# Patient Record
Sex: Female | Born: 1935 | Race: White | Hispanic: No | Smoking: Never smoker
Health system: Southern US, Community
[De-identification: ages and names within clinical notes are randomized; demographics above are authoritative.]

## PROBLEM LIST (undated history)

## (undated) DIAGNOSIS — Z860100 Personal history of colon polyps, unspecified: Secondary | ICD-10-CM

## (undated) DIAGNOSIS — E785 Hyperlipidemia, unspecified: Secondary | ICD-10-CM

## (undated) DIAGNOSIS — K648 Other hemorrhoids: Secondary | ICD-10-CM

## (undated) DIAGNOSIS — B029 Zoster without complications: Secondary | ICD-10-CM

## (undated) HISTORY — DX: Other hemorrhoids: K64.8

## (undated) HISTORY — PX: COLONOSCOPY: SHX174

## (undated) HISTORY — PX: CHOLECYSTECTOMY: SHX55

## (undated) HISTORY — DX: Personal history of colon polyps, unspecified: Z86.0100

## (undated) HISTORY — PX: ESOPHAGOGASTRODUODENOSCOPY: SHX1529

## (undated) HISTORY — DX: Personal history of colonic polyps: Z86.010

## (undated) HISTORY — PX: RIGHT COLECTOMY: SHX853

## (undated) HISTORY — DX: Hyperlipidemia, unspecified: E78.5

---

## 1999-11-23 ENCOUNTER — Other Ambulatory Visit: Admission: RE | Admit: 1999-11-23 | Discharge: 1999-11-23 | Payer: Self-pay | Admitting: Internal Medicine

## 2005-10-11 ENCOUNTER — Ambulatory Visit: Payer: Self-pay | Admitting: Internal Medicine

## 2005-10-16 ENCOUNTER — Ambulatory Visit: Payer: Self-pay | Admitting: Internal Medicine

## 2005-10-22 ENCOUNTER — Encounter: Admission: RE | Admit: 2005-10-22 | Discharge: 2005-10-22 | Payer: Self-pay | Admitting: Internal Medicine

## 2005-10-24 ENCOUNTER — Encounter (INDEPENDENT_AMBULATORY_CARE_PROVIDER_SITE_OTHER): Payer: Self-pay | Admitting: *Deleted

## 2005-10-24 ENCOUNTER — Ambulatory Visit: Payer: Self-pay | Admitting: Internal Medicine

## 2005-11-01 ENCOUNTER — Ambulatory Visit: Payer: Self-pay | Admitting: Internal Medicine

## 2005-11-05 ENCOUNTER — Ambulatory Visit: Payer: Self-pay | Admitting: Internal Medicine

## 2005-11-09 ENCOUNTER — Ambulatory Visit: Payer: Self-pay | Admitting: Internal Medicine

## 2005-12-03 ENCOUNTER — Ambulatory Visit: Payer: Self-pay | Admitting: Internal Medicine

## 2005-12-10 ENCOUNTER — Ambulatory Visit: Payer: Self-pay | Admitting: Internal Medicine

## 2006-01-03 ENCOUNTER — Ambulatory Visit: Payer: Self-pay | Admitting: Internal Medicine

## 2006-01-31 ENCOUNTER — Inpatient Hospital Stay (HOSPITAL_COMMUNITY): Admission: RE | Admit: 2006-01-31 | Discharge: 2006-02-05 | Payer: Self-pay | Admitting: General Surgery

## 2006-01-31 ENCOUNTER — Encounter (INDEPENDENT_AMBULATORY_CARE_PROVIDER_SITE_OTHER): Payer: Self-pay | Admitting: Specialist

## 2006-03-04 ENCOUNTER — Ambulatory Visit: Payer: Self-pay | Admitting: Internal Medicine

## 2006-03-14 ENCOUNTER — Ambulatory Visit: Payer: Self-pay | Admitting: Family Medicine

## 2006-03-25 ENCOUNTER — Ambulatory Visit: Payer: Self-pay | Admitting: Internal Medicine

## 2006-06-19 ENCOUNTER — Ambulatory Visit: Payer: Self-pay | Admitting: Internal Medicine

## 2006-09-06 ENCOUNTER — Ambulatory Visit: Payer: Self-pay | Admitting: Internal Medicine

## 2006-12-10 ENCOUNTER — Encounter: Admission: RE | Admit: 2006-12-10 | Discharge: 2006-12-10 | Payer: Self-pay | Admitting: Internal Medicine

## 2007-05-05 DIAGNOSIS — E785 Hyperlipidemia, unspecified: Secondary | ICD-10-CM | POA: Insufficient documentation

## 2007-05-05 DIAGNOSIS — Z86010 Personal history of colonic polyps: Secondary | ICD-10-CM | POA: Insufficient documentation

## 2007-05-05 DIAGNOSIS — M810 Age-related osteoporosis without current pathological fracture: Secondary | ICD-10-CM | POA: Insufficient documentation

## 2007-08-12 ENCOUNTER — Ambulatory Visit: Payer: Self-pay | Admitting: Internal Medicine

## 2007-08-12 DIAGNOSIS — R109 Unspecified abdominal pain: Secondary | ICD-10-CM | POA: Insufficient documentation

## 2008-10-20 ENCOUNTER — Ambulatory Visit: Payer: Self-pay | Admitting: Internal Medicine

## 2008-10-29 ENCOUNTER — Ambulatory Visit: Payer: Self-pay | Admitting: Internal Medicine

## 2008-10-29 LAB — HM COLONOSCOPY

## 2010-04-11 ENCOUNTER — Telehealth: Payer: Self-pay | Admitting: Internal Medicine

## 2010-04-19 ENCOUNTER — Emergency Department (HOSPITAL_COMMUNITY): Admission: EM | Admit: 2010-04-19 | Discharge: 2010-04-19 | Payer: Self-pay | Admitting: Emergency Medicine

## 2010-06-05 ENCOUNTER — Ambulatory Visit: Payer: Self-pay | Admitting: Internal Medicine

## 2010-06-05 ENCOUNTER — Encounter: Payer: Self-pay | Admitting: Internal Medicine

## 2010-06-05 LAB — CONVERTED CEMR LAB
AST: 24 units/L (ref 0–37)
Alkaline Phosphatase: 150 units/L — ABNORMAL HIGH (ref 39–117)
BUN: 11 mg/dL (ref 6–23)
Basophils Relative: 0.7 % (ref 0.0–3.0)
Bilirubin, Direct: 0.1 mg/dL (ref 0.0–0.3)
CO2: 29 meq/L (ref 19–32)
Calcium: 9.5 mg/dL (ref 8.4–10.5)
Creatinine, Ser: 0.6 mg/dL (ref 0.4–1.2)
GFR calc non Af Amer: 105.96 mL/min (ref >60)
Glucose, Bld: 86 mg/dL (ref 70–99)
HCT: 39.3 % (ref 36.0–46.0)
Hemoglobin: 13.2 g/dL (ref 12.0–15.0)
LDL Cholesterol: 96 mg/dL (ref 0–99)
MCHC: 33.6 g/dL (ref 30.0–36.0)
MCV: 94.7 fL (ref 78.0–100.0)
Monocytes Relative: 6.3 % (ref 3.0–12.0)
Neutro Abs: 4.4 10*3/uL (ref 1.4–7.7)
Platelets: 245 10*3/uL (ref 150.0–400.0)
RBC: 4.15 M/uL (ref 3.87–5.11)
RDW: 13.9 % (ref 11.5–14.6)
TSH: 0.67 microintl units/mL (ref 0.35–5.50)
Total Bilirubin: 0.7 mg/dL (ref 0.3–1.2)
Total CHOL/HDL Ratio: 3

## 2010-06-09 ENCOUNTER — Telehealth: Payer: Self-pay | Admitting: Internal Medicine

## 2010-06-12 LAB — CONVERTED CEMR LAB: Vit D, 25-Hydroxy: 24 ng/mL — ABNORMAL LOW (ref 30–89)

## 2010-08-09 ENCOUNTER — Encounter: Payer: Self-pay | Admitting: Internal Medicine

## 2010-09-21 NOTE — Assessment & Plan Note (Signed)
Summary: CPX (PT WILL COME IN FASTING) // RS   Vital Signs:  Patient profile:   76 year old female Height:      63 inches Weight:      118 pounds BMI:     20.98 Temp:     98.1 degrees F oral Pulse rate:   64 / minute Resp:     14 per minute BP sitting:   124 / 76  (left arm)  Vitals Entered By: Willy Eddy, LPN (June 05, 2010 11:24 AM) CC: annual visit for disease management- fastin this am Is Patient Diabetic? No   Primary Care Provider:  Stacie Glaze MD  CC:  annual visit for disease management- fastin this am.  History of Present Illness: fasting for blood work fx arm left are in August now has RSD ( relex sympathetic dystropy and is in PT and has plans for  a nerve block has osteoporosis but does not like medications refusing immunization  we were able to convince her t have the flu shot she has had multiple bone densities and doen not think she nees another she has osteoporsis  Preventive Screening-Counseling & Management  Alcohol-Tobacco     Smoking Status: never     Tobacco Counseling: not indicated; no tobacco use  Problems Prior to Update: 1)  Physical Examination  (ICD-V70.0) 2)  Abdominal Pain Other Specified Site  (ICD-789.09) 3)  Colonic Polyps, Hx of  (ICD-V12.72) 4)  Family History Diabetes 1st Degree Relative  (ICD-V18.0) 5)  Family History of Colon Ca 1st Degree Relative <60  (ICD-V16.0) 6)  Osteoporosis  (ICD-733.00) 7)  Hyperlipidemia  (ICD-272.4)  Current Problems (verified): 1)  Abdominal Pain Other Specified Site  (ICD-789.09) 2)  Colonic Polyps, Hx of  (ICD-V12.72) 3)  Family History Diabetes 1st Degree Relative  (ICD-V18.0) 4)  Family History of Colon Ca 1st Degree Relative <60  (ICD-V16.0) 5)  Osteoporosis  (ICD-733.00) 6)  Hyperlipidemia  (ICD-272.4)  Medications Prior to Update: 1)  Ca + & Vit D 2)  Doxycycline Hyclate 100 Mg Caps (Doxycycline Hyclate) .... One By Mouth Two Times A Day X 7 Days  Current Medications  (verified): 1)  Oscal 500/200 D-3 500-200 Mg-Unit Tabs (Calcium-Vitamin D) .... One By Mouth Two Times A Day 2)  Vitamin D3 2000 Unit Caps (Cholecalciferol) .... One By Mouth Daily 3)  Aspir-Low 81 Mg Tbec (Aspirin) .... One By Mouth Daily  Allergies (verified): 1)  ! Asa  Contraindications/Deferment of Procedures/Staging:    Test/Procedure: Mammogram    Reason for deferment: patient declined     Test/Procedure: FLU VAX    Reason for deferment: patient declined     Test/Procedure: Pneumovax vaccine    Reason for deferment: patient declined     Test/Procedure: DPT vaccine    Reason for deferment: declined   Past History:  Family History: Last updated: 05/05/2007 Family History of Colon CA 1st degree relative <60 Family History Diabetes 1st degree relative Family History Liver disease Family History Lung cancer Family History of Cardiovascular disorder  Social History: Last updated: 05/05/2007 Married Never Smoked Alcohol use-yes Drug use-no  Risk Factors: Smoking Status: never (06/05/2010)  Past medical, surgical, family and social histories (including risk factors) reviewed for relevance to current acute and chronic problems.  Past Medical History: Reviewed history from 05/05/2007 and no changes required. Hyperlipidemia Osteoporosis Colonic polyps, hx of  Past Surgical History: Reviewed history from 05/05/2007 and no changes required. Colonoscopy  Family History: Reviewed history from 05/05/2007  and no changes required. Family History of Colon CA 1st degree relative <60 Family History Diabetes 1st degree relative Family History Liver disease Family History Lung cancer Family History of Cardiovascular disorder  Social History: Reviewed history from 05/05/2007 and no changes required. Married Never Smoked Alcohol use-yes Drug use-no  Review of Systems  The patient denies anorexia, fever, weight loss, weight gain, vision loss, decreased hearing,  hoarseness, chest pain, syncope, dyspnea on exertion, peripheral edema, prolonged cough, headaches, hemoptysis, abdominal pain, melena, hematochezia, severe indigestion/heartburn, hematuria, incontinence, genital sores, muscle weakness, suspicious skin lesions, transient blindness, difficulty walking, depression, unusual weight change, abnormal bleeding, enlarged lymph nodes, angioedema, and breast masses.         Flu Vaccine Consent Questions     Do you have a history of severe allergic reactions to this vaccine? no    Any prior history of allergic reactions to egg and/or gelatin? no    Do you have a sensitivity to the preservative Thimersol? no    Do you have a past history of Guillan-Barre Syndrome? no    Do you currently have an acute febrile illness? no    Have you ever had a severe reaction to latex? no    Vaccine information given and explained to patient? yes    Are you currently pregnant? no    Lot Number:[id number]   Exp Date:02/17/2011   Site Given  Left Deltoid IM   Physical Exam  General:  Well-developed,well-nourished,in no acute distress; alert,appropriate and cooperative throughout examination Head:  Normocephalic and atraumatic without obvious abnormalities. No apparent alopecia or balding. Eyes:  pupils equal and pupils reactive to light.   Ears:  R ear normal and L ear normal.   Nose:  no external deformity and no nasal discharge.   Lungs:  normal respiratory effort and no wheezes.   Heart:  normal rate and regular rhythm.   Abdomen:  soft and non-tender.   Msk:  No deformity or scoliosis noted of thoracic or lumbar spine.   Pulses:  R and L carotid,radial,femoral,dorsalis pedis and posterior tibial pulses are full and equal bilaterally Extremities:  No clubbing, cyanosis, edema, or deformity noted with normal full range of motion of all joints.   Neurologic:  No cranial nerve deficits noted. Station and gait are normal. Plantar reflexes are down-going bilaterally. DTRs  are symmetrical throughout. Sensory, motor and coordinative functions appear intact.   Impression & Recommendations:  Problem # 1:  PHYSICAL EXAMINATION (ICD-V70.0) The pt was asked about all immunizations, health maint. services that are appropriate to their age and was given guidance on diet exercize  and weight management  Orders: EKG w/ Interpretation (93000) TLB-Lipid Panel (80061-LIPID) TLB-BMP (Basic Metabolic Panel-BMET) (80048-METABOL) TLB-CBC Platelet - w/Differential (85025-CBCD) TLB-Hepatic/Liver Function Pnl (80076-HEPATIC) TLB-TSH (Thyroid Stimulating Hormone) (84443-TSH) Urinalysis-dipstick only (Medicare patient) (16109UE) Venipuncture (45409) Specimen Handling (81191)  Colonoscopy: Location:  [facility].   (10/29/2008) Next Colonoscopy due:: 10/2013 (10/29/2008)  Discussed using sunscreen, use of alcohol, drug use, self breast exam, routine dental care, routine eye care, schedule for GYN exam, routine physical exam, seat belts, multiple vitamins, osteoporosis prevention, adequate calcium intake in diet, recommendations for immunizations, mammograms and Pap smears.  Discussed exercise and checking cholesterol.  Discussed gun safety, safe sex, and contraception.  Problem # 2:  OSTEOPOROSIS (ICD-733.00)  add vitamin D discussed the blone density-- refused Discussed medication use, applications of heat or ice, and exercises.   Orders: T-Vitamin D (25-Hydroxy) [phone])  Complete Medication List: 1)  Oscal 500/200 D-3 500-200 Mg-unit Tabs (Calcium-vitamin d) .... One by mouth two times a day 2)  Vitamin D3 2000 Unit Caps (Cholecalciferol) .... One by mouth daily 3)  Aspir-low 81 Mg Tbec (Aspirin) .... One by mouth daily  Other Orders: Flu Vaccine 40yrs + MEDICARE PATIENTS (E4540) Administration Flu vaccine - MCR (J8119)  Patient Instructions: 1)  Please schedule a follow-up appointment in 1 year. for CPX   Orders Added: 1)  EKG w/  Interpretation [93000] 2)  Est. Patient 65& > [99397] 3)  Est. Patient Level II [99212] 4)  TLB-Lipid Panel [80061-LIPID] 5)  TLB-BMP (Basic Metabolic Panel-BMET) [80048-METABOL] 6)  TLB-CBC Platelet - w/Differential [85025-CBCD] 7)  TLB-Hepatic/Liver Function Pnl [80076-HEPATIC] 8)  TLB-TSH (Thyroid Stimulating Hormone) [84443-TSH] 9)  Urinalysis-dipstick only (Medicare patient) [81003QW] 10)  T-Vitamin D (25-Hydroxy) [14782-95621] 11)  Flu Vaccine 16yrs + MEDICARE PATIENTS [Q2039] 12)  Administration Flu vaccine - MCR [G0008] 13)  Venipuncture [36415] 14)  Specimen Handling [99000]  Appended Document: CPX (PT WILL COME IN FASTING) // RS  Laboratory Results   Urine Tests    Routine Urinalysis   Color: yellow Appearance: Clear Glucose: negative   (Normal Range: Negative) Bilirubin: negative   (Normal Range: Negative) Ketone: negative   (Normal Range: Negative) Spec. Gravity: <1.005   (Normal Range: 1.003-1.035) Blood: trace-lysed   (Normal Range: Negative) pH: 5.0   (Normal Range: 5.0-8.0) Protein: negative   (Normal Range: Negative) Urobilinogen: 0.2   (Normal Range: 0-1) Nitrite: negative   (Normal Range: Negative) Leukocyte Esterace: negative   (Normal Range: Negative)    Comments: Sheila Wood  June 05, 2010 3:13 PM

## 2010-09-21 NOTE — Progress Notes (Signed)
Summary: bee sting  Phone Note Call from Patient   Caller: Patient Call For: Stacie Glaze MD Summary of Call: Bee sting from this weekend that looks infected.  [phone] Initial call taken by: Lynann Beaver CMA,  April 11, 2010 8:54 AM  Follow-up for Phone Call        doxy 100 two times a day for 7 days Follow-up by: Stacie Glaze MD,  April 11, 2010 9:42 AM    New/Updated Medications: DOXYCYCLINE HYCLATE 100 MG CAPS (DOXYCYCLINE HYCLATE) one by mouth two times a day x 7 days Prescriptions: DOXYCYCLINE HYCLATE 100 MG CAPS (DOXYCYCLINE HYCLATE) one by mouth two times a day x 7 days  #14 x 0   Entered by:   Lynann Beaver CMA   Authorized by:   Stacie Glaze MD   Signed by:   Lynann Beaver CMA on 04/11/2010   Method used:   Electronically to        [facility] [phone]* (retail)       [address]       [place], [place]  [address]       Ph: [phone] or [phone]       Fax: [phone]   RxID:   [id number]  Notified pt.

## 2010-09-21 NOTE — Progress Notes (Signed)
Summary: requesting lab results  Phone Note Call from Patient Call back at Home Phone [phone]   Caller: Patient----triage vm Reason for Call: Acute Illness, Lab or Test Results Summary of Call: requesting lab results. please return call. Initial call taken by: Warnell Forester,  June 09, 2010 10:32 AM  Follow-up for Phone Call        pt informed labs wnl except vitamin d and dr Lovell Sheehan m ay put her on high dose of vitamin d for a while- will call her when he returns if he wants to put her on any med Follow-up by: Willy Eddy, LPN,  June 09, 2010 10:50 AM

## 2010-09-21 NOTE — Letter (Signed)
Summary: [facility]   Imported By: Maryln Gottron 08/25/2010 13:51:30  _____________________________________________________________________  External Attachment:    Type:   Image     Comment:   External Document

## 2011-01-05 NOTE — Op Note (Signed)
NAMERACHAEL, FERRIE                  ACCOUNT NO.:  [id number]   MEDICAL RECORD NO.:  [id number]          PATIENT TYPE:  INP   LOCATION:  0006                         FACILITY:  [facility]   PHYSICIAN:  Adolph Pollack, M.D.DATE OF BIRTH:  1934/09/29   DATE OF PROCEDURE:  01/31/2006  DATE OF DISCHARGE:                                 OPERATIVE REPORT   PREOPERATIVE DIAGNOSES:  1.  Persistent tubulovillous adenoma of the cecum.  2.  Cholelithiasis.   POSTOPERATIVE DIAGNOSES:  1.  Persistent tubulovillous adenoma of the cecum.  2.  Cholelithiasis.   PROCEDURES:  1.  Laparoscopic cholecystectomy, with intraoperative cholangiogram.  2.  Laparoscopic-assisted right colectomy.   SURGEON:  Adolph Pollack, M.D.   ASSISTANT:  Currie Paris, M.D.   ANESTHESIA:  General.   INDICATION:  Ms. Reddix is a 76 year old female who is having some early  satiety.  She underwent a screening endoscopy.  Colonoscopy demonstrated a  cecal polyp that was sizable and could not be completely removed.  Biopsy  was consistent with tubulovillous adenoma.  CT scan demonstrated gallstones.  She now presents for the above procedures.   TECHNIQUE:  She was seen in the holding area, then brought to the operating  room, placed supine on the operating table, and a general anesthetic was  administered.  A Foley catheter was placed in the bladder.  The abdominal  wall was sterilely prepped and draped.  A small subumbilical incision was  made through the skin, subcutaneous tissue, fascia, and peritoneum, entering  the peritoneal cavity under direct vision.  A pursestring suture of 0 Vicryl  was placed around the fascial edges.  A Hasson trocar was introduced into  the peritoneal cavity, and pneumoperitoneum created by insufflation of CO2  gas.   Next, the laparoscope was introduced.  There was some omental adhesions  noted to the right lower quadrant abdominal wall that were thin.  An 11 mm  trocar was  placed in the left upper quadrant just off the midline, and a 5  mm trocar placed in the left lower quadrant.  A harmonic scalpel was used to  divide the omental adhesions, freeing up omentum of the anterior abdominal  wall.  I then mobilized the right colon and distal ileum by dividing the  lateral attachments, keeping my plane of dissection above the level of the  ureter.  There were adhesions in between the gallbladder and the mesocolon  which were divided with the harmonic scalpel.  I then mobilize the hepatic  flexure using the harmonic scalpel and was able to grasp the proximal  transverse colon and bring it under the umbilicus.   At this time, I decided to perform the cholecystectomy.  Two 5 mm trocars  were placed in the right midlateral abdomen.  The fundus of the gallbladder  was grasped.  Adhesions were taken down between the duodenum and omentum and  the gallbladder using sharp dissection.  No intestinal injury was noted.  The gallbladder fundus was then retracted toward the right shoulder.  The  infundibulum was mobilized bluntly.  An anterior branch of the cystic artery  was noted lying anterior to the cystic duct.  This was isolated.  The artery  was then clipped and divided.  I then used blunt dissection to isolate the  cystic duct and create a window around it.  A clip was placed at the cystic  duct/gallbladder junction and a small incision made into the cystic duct.  A  cholangiocatheter was then passed through the anterior abdominal wall,  placed into the cystic duct, and the cholangiogram was performed.   Under real-time fluoroscopy, dilute contrast material was injected to the  cystic duct which was of moderate to long length.  The common hepatic, right  and left hepatic, and common bile ducts all filled promptly, and contrast  drained into the duodenum promptly, without evidence of obstruction.  Final  report is pending the radiologist's interpretation.   The  cholangiocatheter was removed. The cystic duct was then clipped three  times proximally and divided.  A posterior branch of the cystic artery was  isolated, clipped, and divided.  The gallbladder was then dissected free  from the liver bed and placed in Endopouch bag.  Irrigation of the  gallbladder fossa demonstrated no bleeding and no bile leak.   Following this, I removed the subumbilical trocar and enlarged that incision  distally for an extraction site.  A wound protection device was placed.  The  gallbladder was removed in the Endopouch bag first.  I then grasped the  cecum and brought out the distal ileum, right colon, and proximal transverse  colon.  The proximal transverse colon was divided distal to the hepatic  flexure with the GIA stapler.  The distal ileum was divided just proximal to  the ileocecal valve with the GIA stapler.  A wedge specimen of mesentery was  then taken by dividing the mesenteric vessels between clamps and ligating  them.  I then took the specimen to the back table, opened it up, and noted  to be a polypoid lesion in the cecal area.   Gloves were changed.  A side-to-side anastomosis was then performed a  stapled fashion.  The remaining enterotomy was closed in two layers.  The  first layer was a running full-thickness 3-0 Vicryl suture.  The next layer  was interrupted 3-0 silk sutures in a Lembert type fashion.  The anastomosis  was patent, viable, under no tension.  The distal staple line was reinforced  with a suture, taking tension off.  The anastomosis was dropped back into  the peritoneal cavity.   Gloves were changed once again.  Irrigation was performed, and no bleeding  was noted.  Needle, instrument, and sponge counts were reported to be  correct.  I then close the fascia of the extraction site incision with a  running #1 PDS suture.  The abdominal cavity was reinsufflated and the camera introduced.  I noted no active bleeding and a solid  fascial closure.  I then released the pneumoperitoneum and removed all trocars.   All skin incisions were then closed with staples, followed by sterile  dressings.  She tolerated the procedure well, without any apparent  complications, and was taken to the recovery room in satisfactory condition.      Adolph Pollack, M.D.  Electronically Signed     TJR/MEDQ  D:  01/31/2006  T:  01/31/2006  Job:  [id number]   cc:   Iva Boop, M.D. [facility]  [facility]  [address] [place]  [place], [place] [address]   Stacie Glaze, M.D. [facility]  [address] [place]  [place] [address]

## 2011-01-05 NOTE — Discharge Summary (Signed)
NAMEBRITAINY, Wood                  ACCOUNT NO.:  [id number]   MEDICAL RECORD NO.:  [id number]          PATIENT TYPE:  INP   LOCATION:  1506                         FACILITY:  [facility]   PHYSICIAN:  Adolph Pollack, M.D.DATE OF BIRTH:  10/20/1934   DATE OF ADMISSION:  01/31/2006  DATE OF DISCHARGE:  02/05/2006                                 DISCHARGE SUMMARY   PRINCIPAL DISCHARGE DIAGNOSIS:  Tubulovillous adenoma with high-grade  dysplasia of the right colon.   SECONDARY DIAGNOSIS:  Chronic cholecystitis and cholelithiasis.   PROCEDURE:  Laparoscopically assisted right colectomy; laparoscopic  cholecystectomy, intraoperative cholangiogram.   REASON FOR ADMISSION:  Sheila Wood is a 76 year old female who had a screening  colonoscopy.  A cecal polyp was removed partially, but not completely.  It  was tubulovillous adenoma, and partial colectomy was recommended.  She also  has cholelithiasis and presents for the above operation.   HOSPITAL COURSE:  She underwent the above operation, which she tolerated  well.  Postoperatively, she had a relatively unremarkable postoperative  course.  Advanced her activity and diet on a steady basis, and by February 06, 2004 was able to be discharged.   DISPOSITION:  Discharged to home in satisfactory condition on February 06, 2004.  She was to return next week to have staples removed, and she was given  Vicodin for pain.      Adolph Pollack, M.D.  Electronically Signed     TJR/MEDQ  D:  03/05/2006  T:  03/05/2006  Job:  [id number]   cc:   Iva Boop, M.D. [facility]  [address] [place], [place] [address]   Stacie Glaze, M.D. [facility]  [address] [place]  [place] [address]

## 2011-10-16 ENCOUNTER — Ambulatory Visit (INDEPENDENT_AMBULATORY_CARE_PROVIDER_SITE_OTHER): Payer: Medicare Other | Admitting: Family

## 2011-10-16 ENCOUNTER — Encounter: Payer: Self-pay | Admitting: Family

## 2011-10-16 VITALS — BP 120/90 | Ht 65.0 in | Wt 120.0 lb

## 2011-10-16 DIAGNOSIS — D2339 Other benign neoplasm of skin of other parts of face: Secondary | ICD-10-CM

## 2011-10-16 DIAGNOSIS — D2330 Other benign neoplasm of skin of unspecified part of face: Secondary | ICD-10-CM

## 2011-10-16 NOTE — Progress Notes (Signed)
  Subjective:    Patient ID: Sheila Wood, female    DOB: 12-06-1934, 77 y.o.   MRN: [id number]  Eye Problem    77 year old white female, nonsmoker, patient of Dr. Lovell Sheehan within today with complaints of a sore lesion on her left eyebrow x2 months. The lesion is always present but varies in size. Today it is swollen and tender. No drainage or discharge from the lesion. It does not impair her vision. In the past, she believes that she may have bumped her head and developed this lesion.   Review of Systems  Constitutional: Negative.   Respiratory: Negative.   Cardiovascular: Negative.   Skin: Negative.        Skin lesion noted to the left eyebrow  Neurological: Negative.   Psychiatric/Behavioral: Negative.    Past Medical History  Diagnosis Date  . Hyperlipidemia   . Osteoporosis   . History of colonic polyps     History   Social History  . Marital Status: Married    Spouse Name: N/A    Number of Children: N/A  . Years of Education: N/A   Occupational History  . Not on file.   Social History Main Topics  . Smoking status: Never Smoker   . Smokeless tobacco: Not on file  . Alcohol Use: Yes     glass of wine  . Drug Use: No  . Sexually Active: Not on file   Other Topics Concern  . Not on file   Social History Narrative  . No narrative on file    No past surgical history on file.  Family History  Problem Relation Age of Onset  . Cancer      colon, lung; fhx  . Liver disease      fhx  . Heart disease      fhx  . Diabetes      fhx    Allergies  Allergen Reactions  . Aspirin     No current outpatient prescriptions on file prior to visit.    BP 120/90  Ht 5\' 5"  (1.651 m)  Wt 120 lb (54.432 kg)  BMI 19.97 kg/m2chart   Objective:   Physical Exam  Constitutional: She is oriented to person, place, and time. She appears well-developed and well-nourished.  Cardiovascular: Normal rate, regular rhythm and normal heart sounds.   Pulmonary/Chest: Effort  normal.  Neurological: She is alert and oriented to person, place, and time.  Skin: Skin is warm and dry.       Cystic lesion noted to the left eyebrow that transilluminates. About the size of a marble.   Psychiatric: She has a normal mood and affect.          Assessment & Plan:  Assessment: Dermatoid lesion of the left eyebrow  Plan: After consulting with Dr. Tawanna Cooler to determine if he would be able to remove the lesion, he has suggested a referral to ophthalmology to have the lesion removed. Patient wishes to go home and think about this and make decision. She will call me and we'll decide whether or not we'll refer her to dermatology or ophthalmology for removal of this lesion.

## 2011-12-31 ENCOUNTER — Encounter (INDEPENDENT_AMBULATORY_CARE_PROVIDER_SITE_OTHER): Payer: Self-pay

## 2013-04-21 ENCOUNTER — Other Ambulatory Visit: Payer: Medicare Other

## 2013-04-22 ENCOUNTER — Other Ambulatory Visit (INDEPENDENT_AMBULATORY_CARE_PROVIDER_SITE_OTHER): Payer: Medicare Other

## 2013-04-22 DIAGNOSIS — R300 Dysuria: Secondary | ICD-10-CM

## 2013-04-22 DIAGNOSIS — N390 Urinary tract infection, site not specified: Secondary | ICD-10-CM

## 2013-04-22 LAB — POCT URINALYSIS DIPSTICK
Bilirubin, UA: NEGATIVE
Glucose, UA: NEGATIVE
Nitrite, UA: NEGATIVE
pH, UA: 5

## 2013-04-22 MED ORDER — SULFAMETHOXAZOLE-TMP DS 800-160 MG PO TABS: 1.0000 | ORAL_TABLET | Freq: Two times a day (BID) | ORAL | Status: DC

## 2013-04-24 LAB — URINE CULTURE: Colony Count: >=100000

## 2013-05-06 ENCOUNTER — Other Ambulatory Visit: Payer: Self-pay | Admitting: *Deleted

## 2013-05-06 MED ORDER — CIPROFLOXACIN HCL 500 MG PO TABS: 500.0000 mg | ORAL_TABLET | Freq: Two times a day (BID) | ORAL | Status: DC

## 2013-05-06 NOTE — Telephone Encounter (Signed)
Continues to c/o uti sx- per drjenkins give cipro 500 bid for 5 days

## 2013-08-27 ENCOUNTER — Encounter: Payer: Self-pay | Admitting: Family Medicine

## 2013-08-27 ENCOUNTER — Ambulatory Visit (INDEPENDENT_AMBULATORY_CARE_PROVIDER_SITE_OTHER): Payer: Medicare Other | Admitting: Family Medicine

## 2013-08-27 VITALS — BP 122/76 | HR 68 | Temp 97.6°F | Wt 118.0 lb

## 2013-08-27 DIAGNOSIS — L989 Disorder of the skin and subcutaneous tissue, unspecified: Secondary | ICD-10-CM

## 2013-08-27 NOTE — Progress Notes (Signed)
Pre visit review using our clinic review tool, if applicable. No additional management support is needed unless otherwise documented below in the visit note. 

## 2013-08-27 NOTE — Progress Notes (Signed)
   Subjective:    Patient ID: Sheila Wood, female    DOB: 19-Jun-1935, 79 y.o.   MRN: [id number]  HPI Patient seen with one year history of nonhealing sore right side of nose No bleeding. No pain. No history of skin cancer. She is a couple times scrape this off but each time this recurs. No alleviating factors.  Patient has been busy taking care of her husband who passed away from pancreatic cancer in October. She states she has taken very little time to take care of herself during this interval.  Past Medical History  Diagnosis Date  . Hyperlipidemia   . Osteoporosis   . History of colonic polyps    No past surgical history on file.  reports that she has never smoked. She does not have any smokeless tobacco history on file. She reports that she drinks alcohol. She reports that she does not use illicit drugs. family history includes Cancer in an other family member; Diabetes in an other family member; Heart disease in an other family member; Liver disease in an other family member. Allergies  Allergen Reactions  . Aspirin       Review of Systems  Constitutional: Negative for appetite change and unexpected weight change.  Skin: Negative for rash.  Hematological: Negative for adenopathy.       Objective:   Physical Exam  Constitutional: She appears well-developed and well-nourished.  Cardiovascular: Normal rate.   Pulmonary/Chest: Effort normal and breath sounds normal. No respiratory distress. She has no wheezes. She has no rales.  Skin:  Patient has small approximately 2 x 3 mm area of mild erythema and hyperkeratosis right side of nose near base.           Assessment & Plan:  Nonspecific skin lesion right side of nose. This does not appear clinically consistent with likely basal cell. Question irritated seborrheic keratosis versus early squamous cell carcinoma. Dermatology referral and patient agrees to this plan

## 2013-10-21 ENCOUNTER — Encounter: Payer: Self-pay | Admitting: Internal Medicine

## 2014-04-02 ENCOUNTER — Encounter: Payer: Medicare Other | Admitting: Internal Medicine

## 2014-04-05 ENCOUNTER — Encounter: Payer: Self-pay | Admitting: Family Medicine

## 2014-04-05 ENCOUNTER — Ambulatory Visit (INDEPENDENT_AMBULATORY_CARE_PROVIDER_SITE_OTHER): Payer: Medicare Other | Admitting: Family Medicine

## 2014-04-05 VITALS — BP 118/70 | HR 60 | Temp 98.3°F | Wt 117.0 lb

## 2014-04-05 DIAGNOSIS — M5416 Radiculopathy, lumbar region: Secondary | ICD-10-CM

## 2014-04-05 DIAGNOSIS — IMO0002 Reserved for concepts with insufficient information to code with codable children: Secondary | ICD-10-CM

## 2014-04-05 NOTE — Patient Instructions (Signed)

## 2014-04-05 NOTE — Progress Notes (Signed)
Pre visit review using our clinic review tool, if applicable. No additional management support is needed unless otherwise documented below in the visit note. 

## 2014-04-05 NOTE — Progress Notes (Signed)
   Subjective:    Patient ID: Sheila Wood, female    DOB: 1934/12/20, 79 y.o.   MRN: [id number]  Hip Pain  Associated symptoms include numbness.   Patient seen with several month history of pain radiating from right lower lumbar region into her buttock and occasionally down to the foot. She describes achy quality pain 7/10 severity at its worst. She has some occasional transient numbness of the lower leg but no weakness. No urine or stool incontinence. Taken occasional Tylenol with minimal relief. Pain was particularly worse last week and the slightly improved today. She generally has no difficulties with walking. No appetite or weight changes. She does give history of osteoporosis but no history of malignancy. Denies recent injury.  No alleviating factors  Past Medical History  Diagnosis Date  . Hyperlipidemia   . Osteoporosis   . History of colonic polyps    No past surgical history on file.  reports that she has never smoked. She does not have any smokeless tobacco history on file. She reports that she drinks alcohol. She reports that she does not use illicit drugs. family history includes Cancer in an other family member; Diabetes in an other family member; Heart disease in an other family member; Liver disease in an other family member. Allergies  Allergen Reactions  . Aspirin       Review of Systems  Constitutional: Negative for fever, chills, appetite change and unexpected weight change.  Respiratory: Negative for shortness of breath.   Cardiovascular: Negative for chest pain.  Gastrointestinal: Negative for abdominal pain.  Genitourinary: Negative for dysuria.  Neurological: Positive for numbness. Negative for weakness.  Hematological: Negative for adenopathy. Does not bruise/bleed easily.       Objective:   Physical Exam  Constitutional: She appears well-developed and well-nourished.  Cardiovascular: Normal rate and regular rhythm.   Pulmonary/Chest: Effort normal and  breath sounds normal. No respiratory distress. She has no wheezes. She has no rales.  Musculoskeletal: She exhibits no edema.  Straight leg raises are negative. No reproducible point tenderness in the lumbar region. No leg edema  Neurological:  Full-strength lower extremities. Difficult to elicit reflexes knee and ankle bilaterally. Normal sensory function throughout.          Assessment & Plan:  Right lumbar radiculopathy symptoms. Nonfocal exam neurologically. Given duration of symptoms, start with plain x-rays lumbar spine. Consider trial of physical therapy. She's not interested in medications at this time.

## 2014-04-06 ENCOUNTER — Ambulatory Visit (INDEPENDENT_AMBULATORY_CARE_PROVIDER_SITE_OTHER)
Admission: RE | Admit: 2014-04-06 | Discharge: 2014-04-06 | Disposition: A | Discharge status: Home / Self Care | Payer: Medicare Other | Source: Ambulatory Visit | Attending: Family Medicine | Admitting: Family Medicine

## 2014-04-06 DIAGNOSIS — M5416 Radiculopathy, lumbar region: Secondary | ICD-10-CM

## 2014-04-06 DIAGNOSIS — IMO0002 Reserved for concepts with insufficient information to code with codable children: Secondary | ICD-10-CM

## 2014-05-15 ENCOUNTER — Encounter: Payer: Self-pay | Admitting: Internal Medicine

## 2014-06-25 ENCOUNTER — Encounter: Payer: Self-pay | Admitting: Internal Medicine

## 2014-09-07 ENCOUNTER — Ambulatory Visit (INDEPENDENT_AMBULATORY_CARE_PROVIDER_SITE_OTHER): Payer: Medicare Other | Admitting: Family Medicine

## 2014-09-07 ENCOUNTER — Encounter: Payer: Self-pay | Admitting: Family Medicine

## 2014-09-07 VITALS — BP 118/68 | Temp 98.1°F | Wt 117.0 lb

## 2014-09-07 DIAGNOSIS — M810 Age-related osteoporosis without current pathological fracture: Secondary | ICD-10-CM

## 2014-09-07 DIAGNOSIS — E785 Hyperlipidemia, unspecified: Secondary | ICD-10-CM

## 2014-09-07 DIAGNOSIS — Z0000 Encounter for general adult medical examination without abnormal findings: Secondary | ICD-10-CM

## 2014-09-07 DIAGNOSIS — IMO0001 Reserved for inherently not codable concepts without codable children: Secondary | ICD-10-CM

## 2014-09-07 DIAGNOSIS — Z86010 Personal history of colonic polyps: Secondary | ICD-10-CM

## 2014-09-07 LAB — COMPREHENSIVE METABOLIC PANEL
ALK PHOS: 90 U/L (ref 39–117)
ALT: 12 U/L (ref 0–35)
AST: 17 U/L (ref 0–37)
Albumin: 4 g/dL (ref 3.5–5.2)
BUN: 18 mg/dL (ref 6–23)
CHLORIDE: 104 meq/L (ref 96–112)
CO2: 30 mEq/L (ref 19–32)
Calcium: 9.5 mg/dL (ref 8.4–10.5)
Creatinine, Ser: 0.74 mg/dL (ref 0.40–1.20)
GFR: 80.66 mL/min (ref >60.00)
GLUCOSE: 91 mg/dL (ref 70–99)
Potassium: 4.8 mEq/L (ref 3.5–5.1)
SODIUM: 140 meq/L (ref 135–145)
Total Bilirubin: 0.5 mg/dL (ref 0.2–1.2)
Total Protein: 6.7 g/dL (ref 6.0–8.3)

## 2014-09-07 LAB — CBC
HCT: 42.9 % (ref 36.0–46.0)
HEMOGLOBIN: 13.8 g/dL (ref 12.0–15.0)
MCHC: 32.3 g/dL (ref 30.0–36.0)
MCV: 93.4 fl (ref 78.0–100.0)
Platelets: 232 10*3/uL (ref 150.0–400.0)
RBC: 4.59 Mil/uL (ref 3.87–5.11)
RDW: 13.8 % (ref 11.5–15.5)
WBC: 5.2 10*3/uL (ref 4.0–10.5)

## 2014-09-07 LAB — LIPID PANEL
CHOLESTEROL: 210 mg/dL — AB (ref 0–200)
HDL: 76.9 mg/dL (ref >39.00)
LDL Cholesterol: 106 mg/dL — ABNORMAL HIGH (ref 0–99)
NONHDL: 133.1
TRIGLYCERIDES: 136 mg/dL (ref 0.0–149.0)
Total CHOL/HDL Ratio: 3
VLDL: 27.2 mg/dL (ref 0.0–40.0)

## 2014-09-07 LAB — TSH: TSH: 0.79 u[IU]/mL (ref 0.35–4.50)

## 2014-09-07 NOTE — Assessment & Plan Note (Signed)
Patient not sure where she had DEXA and declines repeat. She does not want to be treated with medication such as fosamax and was made aware of risks. She does have a history of humerus fracture in 2011. Continue calcium/vitamin d.

## 2014-09-07 NOTE — Assessment & Plan Note (Signed)
ASA allergy. No clear indication primary prevention given age. LDL <100 around age 80 regardless. Update fasting labs today with lipids.

## 2014-09-07 NOTE — Assessment & Plan Note (Signed)
Refuses repeat colonoscopy. Last colonoscopy age 80 with no polyps. She is not interested in further repeat.

## 2014-09-07 NOTE — Progress Notes (Signed)
Sheila Reddish, MD Phone: [phone]  Subjective:  Patient presents today to establish care with me as their new primary care provider. Patient was formerly a patient of Dr. Arnoldo Morale. Chief complaint-noted.   Hyperlipidemia-previously well controlled off medications on check in 2011 from [facility] Lab Results  Component Value Date   [place] 96 06/05/2010  On statin: no Regular exercise: walking weather permitting for 1/2 hour 3 days a week Diet: healthy diet with fruits/veggies ROS- no chest pain or shortness of breath. No myalgias  Osteoporosis- refuses medications Calcium/vitamin D. Eats cheese, no milk. [place] a few years ago and had humerus fracture-in sling.  ROS-no recent fractures  The following were reviewed and entered/updated in epic: Past Medical History  Diagnosis Date  . Hyperlipidemia   . Osteoporosis   . History of colonic polyps    Patient Active Problem List   Diagnosis Date Noted  . Hyperlipidemia 05/05/2007    Priority: Medium  . Osteoporosis 05/05/2007    Priority: Medium  . History of colonic polyps 05/05/2007    Priority: Low   Past Surgical History  Procedure Laterality Date  . Right colectomy      adenomatous polyp  . Cholecystectomy      at time of colectomy    Family History  Problem Relation Age of Onset  . Colon cancer Brother     colon  . Heart disease Mother     unclear history  . Diabetes Sister   . Lung cancer Father     Medications- reviewed and updated Current Outpatient Prescriptions  Medication Sig Dispense Refill  . calcium-vitamin D (OSCAL WITH D) 500-200 MG-UNIT per tablet Take 1 tablet by mouth daily.     No current facility-administered medications for this visit.    Allergies-reviewed and updated Allergies  Allergen Reactions  . Aspirin     History   Social History  . Marital Status: Married    Spouse Name: N/A    Number of Children: N/A  . Years of Education: N/A   Social History Main Topics  .  Smoking status: Never Smoker   . Smokeless tobacco: Not on file  . Alcohol Use: 1.2 oz/week    2 Not specified per week     Comment: glass of wine  . Drug Use: No  . Sexual Activity: Not on file   Other Topics Concern  . Not on file   Social History Narrative   Widowed in Oct 2014. Lives alone. 3 children (daughter, 2 sons) all live close-sees regularly. 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 Oropharynx normal Neck: no thyromegaly CV: RRR no murmurs rubs or gallops Lungs: CTAB no crackles, wheeze, rhonchi Abdomen: soft/nontender/nondistended/normal bowel sounds. No rebound or guarding.  Ext: no edema Skin: warm, dry Neuro: grossly normal, moves all extremities, PERRLA  Assessment/Plan:  80 y.o. female presenting for annual physical.  Health Maintenance counseling: 1. Anticipatory guidance: Patient counseled regarding regular dental exams, wearing seatbelts, sunscreen.  2. Risk factor reduction:  Advised patient of need for regular exercise and diet rich and fruits and vegetables  to reduce risk of heart attack and stroke. She is doing well with both of these 3. Immunizations/screenings/ancillary studies- refuses all advised testing 4. Update fasting labs.   History of colonic polyps Refuses repeat colonoscopy. Last colonoscopy age 83 with no polyps. She is not interested in further repeat.    Hyperlipidemia ASA allergy. No clear indication primary prevention given age. LDL <100 around age 15 regardless. Update fasting labs today with lipids.    Osteoporosis Patient not sure where she  had DEXA and declines repeat. She does not want to be treated with medication such as fosamax and was made aware of risks. She does have a history of humerus fracture in 2011. Continue calcium/vitamin d.    Return precautions advised. 1 year follow up planned  fasting  Orders Placed This Encounter  Procedures  . CBC    [place]  . Comprehensive metabolic panel    [place]    Order Specific Question:  Has the patient fasted?    Answer:  No  . Lipid panel    [place]    Order Specific Question:  Has the patient fasted?    Answer:  No  . TSH    [place]

## 2014-09-07 NOTE — Patient Instructions (Addendum)
You declined another colonoscopy and are aware of risk of colon cancer.   You declined bone density testing and are aware of risk of fractures and that there are medications that could help prevent this. Continue calcium/vit D.   Update fasting labs today. See you in a year as long as labs are good.   We will get those other labs from 12/2011 removed from your chart.

## 2015-12-02 DIAGNOSIS — L814 Other melanin hyperpigmentation: Secondary | ICD-10-CM | POA: Diagnosis not present

## 2015-12-02 DIAGNOSIS — L821 Other seborrheic keratosis: Secondary | ICD-10-CM | POA: Diagnosis not present

## 2015-12-02 DIAGNOSIS — L570 Actinic keratosis: Secondary | ICD-10-CM | POA: Diagnosis not present

## 2015-12-02 DIAGNOSIS — Z85828 Personal history of other malignant neoplasm of skin: Secondary | ICD-10-CM | POA: Diagnosis not present

## 2015-12-02 DIAGNOSIS — D1801 Hemangioma of skin and subcutaneous tissue: Secondary | ICD-10-CM | POA: Diagnosis not present

## 2015-12-06 DIAGNOSIS — H01005 Unspecified blepharitis left lower eyelid: Secondary | ICD-10-CM | POA: Diagnosis not present

## 2015-12-06 DIAGNOSIS — H01001 Unspecified blepharitis right upper eyelid: Secondary | ICD-10-CM | POA: Diagnosis not present

## 2015-12-06 DIAGNOSIS — H2513 Age-related nuclear cataract, bilateral: Secondary | ICD-10-CM | POA: Diagnosis not present

## 2015-12-06 DIAGNOSIS — H401122 Primary open-angle glaucoma, left eye, moderate stage: Secondary | ICD-10-CM | POA: Diagnosis not present

## 2015-12-06 DIAGNOSIS — H01004 Unspecified blepharitis left upper eyelid: Secondary | ICD-10-CM | POA: Diagnosis not present

## 2015-12-06 DIAGNOSIS — H04123 Dry eye syndrome of bilateral lacrimal glands: Secondary | ICD-10-CM | POA: Diagnosis not present

## 2015-12-06 DIAGNOSIS — H01002 Unspecified blepharitis right lower eyelid: Secondary | ICD-10-CM | POA: Diagnosis not present

## 2015-12-06 DIAGNOSIS — H401111 Primary open-angle glaucoma, right eye, mild stage: Secondary | ICD-10-CM | POA: Diagnosis not present

## 2015-12-29 ENCOUNTER — Encounter: Payer: Self-pay | Admitting: Family Medicine

## 2015-12-29 ENCOUNTER — Ambulatory Visit (INDEPENDENT_AMBULATORY_CARE_PROVIDER_SITE_OTHER): Payer: Medicare Other | Admitting: Family Medicine

## 2015-12-29 VITALS — BP 100/70 | HR 65 | Temp 98.3°F | Wt 119.0 lb

## 2015-12-29 DIAGNOSIS — H9191 Unspecified hearing loss, right ear: Secondary | ICD-10-CM

## 2015-12-29 DIAGNOSIS — H6121 Impacted cerumen, right ear: Secondary | ICD-10-CM

## 2015-12-29 NOTE — Progress Notes (Signed)
Subjective:  Sheila Wood is a 81 y.o. year old very pleasant female patient who presents for/with See problem oriented charting ROS- see any ROS included in HPI as well.   Past Medical History-  Patient Active Problem List   Diagnosis Date Noted  . Hyperlipidemia 05/05/2007    Priority: Medium  . Osteoporosis 05/05/2007    Priority: Medium  . History of colonic polyps 05/05/2007    Priority: Low    Medications- reviewed and updated Current Outpatient Prescriptions  Medication Sig Dispense Refill  . calcium-vitamin D (OSCAL WITH D) 500-200 MG-UNIT per tablet Take 1 tablet by mouth daily.     No current facility-administered medications for this visit.    Objective: BP 100/70 mmHg  Pulse 65  Temp(Src) 98.3 F (36.8 C)  Wt 119 lb (53.978 kg) Gen: NAD, resting comfortably TM bilaterally obstructed by cerumen, some dried blood in left nare- nares otherwise normal CV: RRR no murmurs rubs or gallops Lungs: CTAB no crackles, wheeze, rhonchi Ext: no edema Skin: warm, dry Neuro: grossly normal, moves all extremities  Assessment/Plan:  Cerumen impaction, right - Plan: Ambulatory referral to ENT Hearing loss, right - Plan: Ambulatory referral to ENT S: Patient has noted ear fullness/congestion for 2 weeks. Seems to have worsened and now cannot hear well out of right ear. Has tried hydrogen peroxide without relief. Describes a very mild achewithout radiation of pain.  ROS- No discharge from ear, no headache or blurry vision.  She also mentions-Nosebleeds for 2 years- when drinks hot coffee or has a shower. Pinching stops issue. Had 1 episode lasting longer up to 15 minutes recently.  A/P: we attempted irrigation today but unsuccessful. We will refer her to ENT for further evaluation. She will trial mineral oil in the meantime per AVS  We also discussed trial of nasal saline for her intermittent nosebleeds as well as humidifier at night- she will trial. She wants to discuss this issue  with ENT as well (try to get patient in within a week as cerumen impaction really bothering her)  Return precautions advised.   Orders Placed This Encounter  Procedures  . Ambulatory referral to ENT    Referral Priority:  Routine    Referral Type:  Consultation    Referral Reason:  Specialty Services Required    Requested Specialty:  Otolaryngology    Number of Visits Requested:  1   Garret Reddish, MD

## 2015-12-29 NOTE — Patient Instructions (Signed)
For cerumen impaction/ear wax impaction 1. We will call you within a week about your referral to ENT. If you do not hear within 5-6 days, call us.  2. May try mineral oil in laxative aisle. 3-4 drops 3x a day and make sure to empty it out after use  You can also mention nosebleeds to them but I would try a humidifier in your bedroom and perhaps nasal saline spray a few times a day.

## 2015-12-30 DIAGNOSIS — R040 Epistaxis: Secondary | ICD-10-CM | POA: Diagnosis not present

## 2015-12-30 DIAGNOSIS — H6121 Impacted cerumen, right ear: Secondary | ICD-10-CM | POA: Diagnosis not present

## 2016-03-06 DIAGNOSIS — H401111 Primary open-angle glaucoma, right eye, mild stage: Secondary | ICD-10-CM | POA: Diagnosis not present

## 2016-03-06 DIAGNOSIS — H2513 Age-related nuclear cataract, bilateral: Secondary | ICD-10-CM | POA: Diagnosis not present

## 2016-03-06 DIAGNOSIS — H04123 Dry eye syndrome of bilateral lacrimal glands: Secondary | ICD-10-CM | POA: Diagnosis not present

## 2016-03-06 DIAGNOSIS — H401122 Primary open-angle glaucoma, left eye, moderate stage: Secondary | ICD-10-CM | POA: Diagnosis not present

## 2016-03-12 ENCOUNTER — Encounter: Payer: Self-pay | Admitting: Family Medicine

## 2016-03-12 ENCOUNTER — Ambulatory Visit (INDEPENDENT_AMBULATORY_CARE_PROVIDER_SITE_OTHER): Payer: Medicare Other | Admitting: Family Medicine

## 2016-03-12 VITALS — BP 116/72 | HR 57 | Temp 97.9°F | Ht 64.75 in | Wt 119.6 lb

## 2016-03-12 DIAGNOSIS — Z0000 Encounter for general adult medical examination without abnormal findings: Secondary | ICD-10-CM

## 2016-03-12 DIAGNOSIS — E785 Hyperlipidemia, unspecified: Secondary | ICD-10-CM

## 2016-03-12 DIAGNOSIS — H409 Unspecified glaucoma: Secondary | ICD-10-CM | POA: Insufficient documentation

## 2016-03-12 DIAGNOSIS — M810 Age-related osteoporosis without current pathological fracture: Secondary | ICD-10-CM

## 2016-03-12 DIAGNOSIS — Z1211 Encounter for screening for malignant neoplasm of colon: Secondary | ICD-10-CM

## 2016-03-12 LAB — CBC
HCT: 40.4 % (ref 36.0–46.0)
Hemoglobin: 13.4 g/dL (ref 12.0–15.0)
MCHC: 33.3 g/dL (ref 30.0–36.0)
MCV: 91.4 fl (ref 78.0–100.0)
PLATELETS: 239 10*3/uL (ref 150.0–400.0)
RBC: 4.42 Mil/uL (ref 3.87–5.11)
RDW: 13.8 % (ref 11.5–15.5)
WBC: 5.4 10*3/uL (ref 4.0–10.5)

## 2016-03-12 LAB — COMPREHENSIVE METABOLIC PANEL
ALT: 10 U/L (ref 0–35)
AST: 15 U/L (ref 0–37)
Albumin: 4.2 g/dL (ref 3.5–5.2)
Alkaline Phosphatase: 93 U/L (ref 39–117)
BILIRUBIN TOTAL: 0.6 mg/dL (ref 0.2–1.2)
BUN: 14 mg/dL (ref 6–23)
CALCIUM: 9.7 mg/dL (ref 8.4–10.5)
CO2: 29 meq/L (ref 19–32)
CREATININE: 0.67 mg/dL (ref 0.40–1.20)
Chloride: 103 mEq/L (ref 96–112)
GFR: 90.11 mL/min (ref >60.00)
GLUCOSE: 79 mg/dL (ref 70–99)
Potassium: 4.7 mEq/L (ref 3.5–5.1)
Sodium: 141 mEq/L (ref 135–145)
Total Protein: 6.7 g/dL (ref 6.0–8.3)

## 2016-03-12 LAB — LIPID PANEL
CHOL/HDL RATIO: 3
Cholesterol: 243 mg/dL — ABNORMAL HIGH (ref 0–200)
HDL: 82.9 mg/dL (ref >39.00)
LDL Cholesterol: 130 mg/dL — ABNORMAL HIGH (ref 0–99)
NONHDL: 159.8
Triglycerides: 147 mg/dL (ref 0.0–149.0)
VLDL: 29.4 mg/dL (ref 0.0–40.0)

## 2016-03-12 NOTE — Patient Instructions (Signed)
I think you are doing very well. I hope I am in as good of health when I am 81!   Labs before you leave  Please pick up stool cards and bring them back to [place] when completed.   Declined all immunizations specifically pneumonia. Declined repeat workup of osteoporosis but will continue caclium/vitamin D, regular walking to reduce risks

## 2016-03-12 NOTE — Progress Notes (Signed)
Phone: [phone]  Subjective:  Patient presents today for their annual physical. Chief complaint-noted.   See problem oriented charting- ROS- full  review of systems was completed and negative including No chest pain or shortness of breath. No headache or blurry vision. Does have some mild constipation at times  The following were reviewed and entered/updated in epic: Past Medical History:  Diagnosis Date  . History of colonic polyps   . Hyperlipidemia   . Osteoporosis    Patient Active Problem List   Diagnosis Date Noted  . Hyperlipidemia 05/05/2007    Priority: Medium  . Osteoporosis 05/05/2007    Priority: Medium  . Glaucoma 03/12/2016    Priority: Low  . History of colonic polyps 05/05/2007    Priority: Low   Past Surgical History:  Procedure Laterality Date  . CHOLECYSTECTOMY     at time of colectomy  . RIGHT COLECTOMY     adenomatous polyp    Family History  Problem Relation Age of Onset  . Colon cancer Brother     colon  . Heart disease Mother     unclear history  . Diabetes Sister   . Lung cancer Father     Medications- reviewed and updated Current Outpatient Prescriptions  Medication Sig Dispense Refill  . calcium-vitamin D (OSCAL WITH D) 500-200 MG-UNIT per tablet Take 1 tablet by mouth daily.     No current facility-administered medications for this visit.     Allergies-reviewed and updated Allergies  Allergen Reactions  . Aspirin     Social History   Social History  . Marital status: Married    Spouse name: N/A  . Number of children: N/A  . Years of education: N/A   Social History Main Topics  . Smoking status: Never Smoker  . Smokeless tobacco: None  . Alcohol use 1.2 oz/week    2 Standard drinks or equivalent per week     Comment: glass of wine  . Drug use: No  . Sexual activity: Not Asked   Other Topics Concern  . None   Social History Narrative   Widowed in Oct 2014. Lives alone. 3 children (daughter, 2 sons) all live  close-sees regularly. 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 Oropharynx normal. No cervical lymphadenopathy Neck: no thyromegaly CV: RRR no murmurs rubs or gallops Lungs: CTAB no crackles, wheeze, rhonchi Abdomen: soft/nontender/nondistended/normal bowel sounds. No rebound or guarding.  Ext: no edema Skin: warm, dry Neuro: grossly normal, moves all extremities, PERRLA  Declines breast and GYN exam  Assessment/Plan:  81 y.o. female presenting for annual physical.  Health Maintenance counseling: 1. Anticipatory guidance: Patient counseled regarding regular dental exams, eye exams- every year Dr. Bing Plume, wearing seatbelts.  2. Risk factor reduction:  Advised patient of need for regular exercise (does very well- walks regularly) and diet rich and fruits and vegetables to reduce risk of heart attack and  stroke.  3. Immunizations/screenings/ancillary studies- opts out of flu shot. Opts out of pneumonia shots.  Immunization History  Administered Date(s) Administered  . Influenza Whole 06/05/2010   4. Cervical cancer screening- aged out per ascvd guidelines 5. Breast cancer screening-  Aged out per ascvd guidelines. Does self exams- will get follow up if any changes on home exam. Declines exam today  6. Colon cancer screening - history of colon polyps and R eolectomy for large adenomatous polyp, declines repeat colonoscopy advised since 2015-will agree to stool cards 7. Skin  cancer screening- skin surgery center seen yearly  Status of chronic or acute concerns  HLD- no clear indication for statin for primary prevention Lab Results  Component Value Date   CHOL 210 (H) 09/07/2014   HDL 76.90 09/07/2014   LDLCALC 106 (H) 09/07/2014   TRIG 136.0 09/07/2014   CHOLHDL 3 09/07/2014   Osteoporosis-declines further DEXA as would decline medication. Advised regular exercise weight bearing   Stool cards, lipid, cbc, cmp - today  No problem-specific Assessment & Plan notes found for this encounter.   No Follow-up on file.  Orders Placed This Encounter  Procedures  . CBC  . Comprehensive metabolic panel  . Lipid panel  . POC Hemoccult Bld/Stl (3-Cd Home Screen)    Send home    Standing Status:   Future    Standing Expiration Date:   03/12/2017    No orders of the defined types were placed in this encounter.   Return precautions advised.   Garret Reddish, MD

## 2016-03-12 NOTE — Progress Notes (Signed)
Pre visit review using our clinic review tool, if applicable. No additional management support is needed unless otherwise documented below in the visit note. 

## 2016-03-16 ENCOUNTER — Other Ambulatory Visit (INDEPENDENT_AMBULATORY_CARE_PROVIDER_SITE_OTHER): Payer: Medicare Other

## 2016-03-16 DIAGNOSIS — Z1211 Encounter for screening for malignant neoplasm of colon: Secondary | ICD-10-CM | POA: Diagnosis not present

## 2016-03-16 LAB — POC HEMOCCULT BLD/STL (HOME/3-CARD/SCREEN)
Card #3 Fecal Occult Blood, POC: NEGATIVE
FECAL OCCULT BLD: NEGATIVE
Fecal Occult Blood, POC: NEGATIVE

## 2016-05-29 DIAGNOSIS — L728 Other follicular cysts of the skin and subcutaneous tissue: Secondary | ICD-10-CM | POA: Diagnosis not present

## 2016-05-29 DIAGNOSIS — D1801 Hemangioma of skin and subcutaneous tissue: Secondary | ICD-10-CM | POA: Diagnosis not present

## 2016-05-29 DIAGNOSIS — Z85828 Personal history of other malignant neoplasm of skin: Secondary | ICD-10-CM | POA: Diagnosis not present

## 2016-05-29 DIAGNOSIS — L814 Other melanin hyperpigmentation: Secondary | ICD-10-CM | POA: Diagnosis not present

## 2016-05-29 DIAGNOSIS — L821 Other seborrheic keratosis: Secondary | ICD-10-CM | POA: Diagnosis not present

## 2016-06-06 DIAGNOSIS — H401111 Primary open-angle glaucoma, right eye, mild stage: Secondary | ICD-10-CM | POA: Diagnosis not present

## 2016-06-06 DIAGNOSIS — H401122 Primary open-angle glaucoma, left eye, moderate stage: Secondary | ICD-10-CM | POA: Diagnosis not present

## 2016-06-06 DIAGNOSIS — H2513 Age-related nuclear cataract, bilateral: Secondary | ICD-10-CM | POA: Diagnosis not present

## 2016-06-06 DIAGNOSIS — H04123 Dry eye syndrome of bilateral lacrimal glands: Secondary | ICD-10-CM | POA: Diagnosis not present

## 2016-06-24 DIAGNOSIS — R0781 Pleurodynia: Secondary | ICD-10-CM | POA: Diagnosis not present

## 2016-06-29 ENCOUNTER — Encounter: Payer: Self-pay | Admitting: Family Medicine

## 2016-06-29 ENCOUNTER — Ambulatory Visit (INDEPENDENT_AMBULATORY_CARE_PROVIDER_SITE_OTHER): Payer: Medicare Other | Admitting: Family Medicine

## 2016-06-29 VITALS — BP 126/80 | HR 78 | Temp 98.4°F | Wt 122.0 lb

## 2016-06-29 DIAGNOSIS — J069 Acute upper respiratory infection, unspecified: Secondary | ICD-10-CM | POA: Diagnosis not present

## 2016-06-29 MED ORDER — BENZONATATE 100 MG PO CAPS: 100.0000 mg | ORAL_CAPSULE | Freq: Two times a day (BID) | ORAL | 0 refills | Status: DC | PRN

## 2016-06-29 NOTE — Progress Notes (Signed)
PCP: Garret Reddish, MD  Subjective:  Sheila Wood is a 81 y.o. year old very pleasant female patient who presents with Upper Respiratory infection symptoms including cough primarily dry. Minimal sore throat, nasal congestion -started: 7 days ago- last Saturday. symptoms show no change -previous treatments:  Just started some mucinex yesterday -sick contacts/travel/risks: denies flu exposure. Did have a sick frined with similar - seen in urgent care a week ago after bending forward and feeling immediate pain in left lower chest- was diagnosed with msk strain- worse with coughing  ROS-denies fever, SOB, NVD, tooth pain  Pertinent Past Medical History- osteoporosis, hyperlipidemia  Medications- reviewed  Current Outpatient Prescriptions  Medication Sig Dispense Refill  . calcium-vitamin D (OSCAL WITH D) 500-200 MG-UNIT per tablet Take 1 tablet by mouth daily.     No current facility-administered medications for this visit.     Objective: BP 126/80 (BP Location: Left Arm, Patient Position: Sitting, Cuff Size: Normal)   Pulse 78   Temp 98.4 F (36.9 C) (Oral)   Wt 122 lb (55.3 kg)   SpO2 93%   BMI 20.46 kg/m  Gen: NAD, resting comfortably HEENT: Turbinates erythematous, TM normal, pharynx mildly erythematous with no tonsilar exudate or edema, no sinus tenderness CV: RRR no murmurs rubs or gallops Lungs: CTAB no crackles, wheeze, rhonchi Abdomen: soft/nontender/nondistended/normal bowel sounds. No rebound or guarding.  Ext: no edema Skin: warm, dry, no rash Neuro: grossly normal, moves all extremities  Assessment/Plan:  Upper Respiratory infection History and exam today are suggestive of viral infection most likely due to upper respiratory infection. Symptomatic treatment with: tessalon capsules. If this is not covered by insurance- could do mucinex-dextromethorphan (DM). You can also buy dextromethorphan separately.   We discussed that we did not find any infection that had  higher probability of being bacterial such as pneumonia or strep throat. We discussed signs that bacterial infection may have developed particularly fever or shortness of breath. Likely course of 2 weeks. Patient is contagious and advised good handwashing and consideration of mask If going to be in public places.   Finally, we reviewed reasons to return to care including if symptoms worsen or persist past 10 more days or new concerns arise- once again particularly shortness of breath or fever.  Meds ordered this encounter  Medications  . benzonatate (TESSALON) 100 MG capsule    Sig: Take 1 capsule (100 mg total) by mouth 2 (two) times daily as needed for cough.    Dispense:  20 capsule    Refill:  0    Garret Reddish, MD

## 2016-06-29 NOTE — Progress Notes (Signed)
Pre visit review using our clinic review tool, if applicable. No additional management support is needed unless otherwise documented below in the visit note. 

## 2016-06-29 NOTE — Patient Instructions (Signed)
  Upper Respiratory infection History and exam today are suggestive of viral infection most likely due to upper respiratory infection. Symptomatic treatment with: tessalon capsules. If this is not covered by insurance- could do mucinex-dextromethorphan (DM). You can also buy dextromethorphan separately.   Chance this could be early bronchitis but lungs were clear on exam. Time will tell- bronchitis lasts more 3-6 weeks vs. Upper respiratory more in 2 week range  We discussed that we did not find any infection that had higher probability of being bacterial such as pneumonia or strep throat. We discussed signs that bacterial infection may have developed particularly fever or shortness of breath. Likely course of 2 weeks. Patient is contagious and advised good handwashing and consideration of mask If going to be in public places.   Finally, we reviewed reasons to return to care including if symptoms worsen or persist past 10 more days or new concerns arise- once again particularly shortness of breath or fever.  Meds ordered this encounter  Medications  . benzonatate (TESSALON) 100 MG capsule    Sig: Take 1 capsule (100 mg total) by mouth 2 (two) times daily as needed for cough.    Dispense:  20 capsule    Refill:  0

## 2016-07-10 ENCOUNTER — Ambulatory Visit: Payer: Medicare Other | Admitting: Family Medicine

## 2016-09-14 ENCOUNTER — Ambulatory Visit (INDEPENDENT_AMBULATORY_CARE_PROVIDER_SITE_OTHER): Payer: Medicare Other | Admitting: Family Medicine

## 2016-09-14 ENCOUNTER — Encounter: Payer: Self-pay | Admitting: Family Medicine

## 2016-09-14 VITALS — BP 126/80 | HR 103 | Temp 100.2°F | Ht 64.75 in | Wt 121.8 lb

## 2016-09-14 DIAGNOSIS — R509 Fever, unspecified: Secondary | ICD-10-CM

## 2016-09-14 DIAGNOSIS — J111 Influenza due to unidentified influenza virus with other respiratory manifestations: Secondary | ICD-10-CM

## 2016-09-14 LAB — POCT INFLUENZA A: Rapid Influenza A Ag: POSITIVE

## 2016-09-14 NOTE — Progress Notes (Signed)
Pre visit review using our clinic review tool, if applicable. No additional management support is needed unless otherwise documented below in the visit note. 

## 2016-09-14 NOTE — Progress Notes (Signed)
PCP: Garret Reddish, MD  Subjective:  Sheila Wood is a 82 y.o. year old very pleasant female patient who presents with flu like symptoms including fever,body aches.  -other symptoms: some mild sore throat, cough,chills, temperatures into 99s and low 100s -started: monday -inside 48 hour treatment window if needed for tamiflu: no -high risk condition (children <5, adults >65, chronic pulmonary or cardiac condition, immunosuppression, pregnancy, nursing home resident, morbid obesity) : yes -symptoms are improving -previous treatments: acetaminophen - denies sick contact; specifically influenza: not known  ROS-denies SOB, NVD, sinus or dental pain  Pertinent Past Medical History-  Patient Active Problem List   Diagnosis Date Noted  . Hyperlipidemia 05/05/2007    Priority: Medium  . Osteoporosis 05/05/2007    Priority: Medium  . Glaucoma 03/12/2016    Priority: Low  . History of colonic polyps 05/05/2007    Priority: Low    Medications- reviewed  Current Outpatient Prescriptions  Medication Sig Dispense Refill  . calcium-vitamin D (OSCAL WITH D) 500-200 MG-UNIT per tablet Take 1 tablet by mouth daily.     No current facility-administered medications for this visit.     Objective: BP 126/80 (BP Location: Left Arm, Patient Position: Sitting, Cuff Size: Normal)   Pulse (!) 103   Temp 100.2 F (37.9 C) (Oral)   Ht 5' 4.75" (1.645 m)   Wt 121 lb 12.8 oz (55.2 kg)   SpO2 95%   BMI 20.43 kg/m  Gen: NAD, appears fatigued HEENT: Turbinates erythematous, TM normal, pharynx mildly erythematous with no tonsilar exudate or edema, no sinus tenderness CV: RRR no murmurs rubs or gallops Lungs: CTAB no crackles, wheeze, rhonchi Abdomen: soft/nontender/nondistended/normal bowel sounds. Ext: no edema Skin: warm, dry, no rash  Results for orders placed or performed in visit on 09/14/16 (from the past 24 hour(s))  POCT Influenza A     Status: None   Collection Time: 09/14/16  1:16 PM   Result Value Ref Range   Rapid Influenza A Ag POSITIVE     Assessment/Plan:  Influenza History and exam today are suggestive of viral process. Patients influenza test was positive. Pretest probability of influenza was high.   Patient will not be treated with Tamiflu. Prophylaxis for other [place] patients: no but son with CHF so should call his family doctor Symptomatic treatment with: rest, hydration, tylenol  Finally, we reviewed reasons to return to care including if symptoms worsen or persist or new concerns arise. We specifically discussed risks of pneumonia developing and if she had shortness of breath or starts to feel worse at this point. Discussed about 10-14 days to feel better typically- some people will feel better closer to 7 days. Declined flu shot this year prior.   Garret Reddish, MD

## 2016-09-14 NOTE — Patient Instructions (Addendum)
Influenza History and exam today are suggestive of viral process. Patients influenza test was positive. Pretest probability of influenza was high.   Patient will not be treated with Tamiflu. Prophylaxis for other  patients: no but son with CHF so should call his family doctor Symptomatic treatment with: rest, hydration, tylenol  Finally, we reviewed reasons to return to care including if symptoms worsen or persist or new concerns arise. We specifically discussed risks of pneumonia developing and if she had shortness of breath or starts to feel worse at this point. Discussed about 10-14 days to feel better typically- some people will feel better closer to 7 days.    Influenza, Adult Influenza, more commonly known as "the flu," is a viral infection that primarily affects the respiratory tract. The respiratory tract includes organs that help you breathe, such as the lungs, nose, and throat. The flu causes many common cold symptoms, as well as a high fever and body aches. The flu spreads easily from person to person (is contagious). Getting a flu shot (influenza vaccination) every year is the best way to prevent influenza. What are the causes? Influenza is caused by a virus. You can catch the virus by:  Breathing in droplets from an infected person's cough or sneeze.  Touching something that was recently contaminated with the virus and then touching your mouth, nose, or eyes. What increases the risk? The following factors may make you more likely to get the flu:  Not cleaning your hands frequently with soap and water or alcohol-based hand sanitizer.  Having close contact with many people during cold and flu season.  Touching your mouth, eyes, or nose without washing or sanitizing your hands first.  Not drinking enough fluids or not eating a healthy diet.  Not getting enough sleep or exercise.  Being under a high amount of stress.  Not getting a yearly (annual) flu shot. You may  be at a higher risk of complications from the flu, such as a severe lung infection (pneumonia), if you:  Are over the age of 106.  Are pregnant.  Have a weakened disease-fighting system (immune system). You may have a weakened immune system if you:  Have HIV or AIDS.  Are undergoing chemotherapy.  Aretaking medicines that reduce the activity of (suppress) the immune system.  Have a long-term (chronic) illness, such as heart disease, kidney disease, diabetes, or lung disease.  Have a liver disorder.  Are obese.  Have anemia. What are the signs or symptoms? Symptoms of this condition typically last 10 days and may include:  Fever.  Chills.  Headache, body aches, or muscle aches.  Sore throat.  Cough.  Runny or congested nose.  Chest discomfort and cough.  Poor appetite.  Weakness or tiredness (fatigue).  Dizziness.  Nausea or vomiting. How is this diagnosed? This condition may be diagnosed based on your medical history and a physical exam. Your health care provider may do a nose or throat swab test to confirm the diagnosis. How is this treated? If influenza is detected early, you can be treated with antiviral medicine that can reduce the length of your illness and the severity of your symptoms. This medicine may be given by mouth (orally) or through an IV tube that is inserted in one of your veins. The goal of treatment is to relieve symptoms by taking care of yourself at home. This may include taking over-the-counter medicines, drinking plenty of fluids, and adding humidity to the air in your home. In some cases,  influenza goes away on its own. Severe influenza or complications from influenza may be treated in a hospital. Follow these instructions at home:  Take over-the-counter and prescription medicines only as told by your health care provider.  Use a cool mist humidifier to add humidity to the air in your home. This can make breathing easier.  Rest as  needed.  Drink enough fluid to keep your urine clear or pale yellow.  Cover your mouth and nose when you cough or sneeze.  Wash your hands with soap and water often, especially after you cough or sneeze. If soap and water are not available, use hand sanitizer.  Stay home from work or school as told by your health care provider. Unless you are visiting your health care provider, try to avoid leaving home until your fever has been gone for 24 hours without the use of medicine.  Keep all follow-up visits as told by your health care provider. This is important. How is this prevented?  Getting an annual flu shot is the best way to avoid getting the flu. You may get the flu shot in late summer, fall, or winter. Ask your health care provider when you should get your flu shot.  Wash your hands often or use hand sanitizer often.  Avoid contact with people who are sick during cold and flu season.  Eat a healthy diet, drink plenty of fluids, get enough sleep, and exercise regularly. Contact a health care provider if:  You develop new symptoms.  You have:  Chest pain.  Diarrhea.  A fever.  Your cough gets worse.  You produce more mucus.  You feel nauseous or you vomit. Get help right away if:  You develop shortness of breath or difficulty breathing.  Your skin or nails turn a bluish color.  You have severe pain or stiffness in your neck.  You develop a sudden headache or sudden pain in your face or ear.  You cannot stop vomiting. This information is not intended to replace advice given to you by your health care provider. Make sure you discuss any questions you have with your health care provider. Document Released: 08/03/2000 Document Revised: 01/12/2016 Document Reviewed: 05/31/2015 Elsevier Interactive Patient Education  2017 [employer].

## 2016-09-21 DIAGNOSIS — H401111 Primary open-angle glaucoma, right eye, mild stage: Secondary | ICD-10-CM | POA: Diagnosis not present

## 2016-09-21 DIAGNOSIS — H401122 Primary open-angle glaucoma, left eye, moderate stage: Secondary | ICD-10-CM | POA: Diagnosis not present

## 2016-09-21 DIAGNOSIS — H2513 Age-related nuclear cataract, bilateral: Secondary | ICD-10-CM | POA: Diagnosis not present

## 2016-11-27 DIAGNOSIS — L814 Other melanin hyperpigmentation: Secondary | ICD-10-CM | POA: Diagnosis not present

## 2016-11-27 DIAGNOSIS — L821 Other seborrheic keratosis: Secondary | ICD-10-CM | POA: Diagnosis not present

## 2016-11-27 DIAGNOSIS — D1801 Hemangioma of skin and subcutaneous tissue: Secondary | ICD-10-CM | POA: Diagnosis not present

## 2016-11-27 DIAGNOSIS — L728 Other follicular cysts of the skin and subcutaneous tissue: Secondary | ICD-10-CM | POA: Diagnosis not present

## 2016-12-25 DIAGNOSIS — H53452 Other localized visual field defect, left eye: Secondary | ICD-10-CM | POA: Diagnosis not present

## 2016-12-25 DIAGNOSIS — H401122 Primary open-angle glaucoma, left eye, moderate stage: Secondary | ICD-10-CM | POA: Diagnosis not present

## 2016-12-25 DIAGNOSIS — H2513 Age-related nuclear cataract, bilateral: Secondary | ICD-10-CM | POA: Diagnosis not present

## 2016-12-25 DIAGNOSIS — H401111 Primary open-angle glaucoma, right eye, mild stage: Secondary | ICD-10-CM | POA: Diagnosis not present

## 2017-03-20 ENCOUNTER — Ambulatory Visit: Payer: Medicare Other

## 2017-03-27 ENCOUNTER — Ambulatory Visit (INDEPENDENT_AMBULATORY_CARE_PROVIDER_SITE_OTHER): Payer: Medicare Other

## 2017-03-27 VITALS — BP 118/78 | Ht 65.0 in | Wt 120.0 lb

## 2017-03-27 DIAGNOSIS — Z0000 Encounter for general adult medical examination without abnormal findings: Secondary | ICD-10-CM | POA: Diagnosis not present

## 2017-03-27 NOTE — Progress Notes (Signed)
Subjective:   Sydne Krahl is a 82 y.o. female who presents for Medicare Annual (Subsequent) preventive examination.  The Patient was informed that the wellness visit is to identify future health risk and educate and initiate measures that can reduce risk for increased disease through the lifespan.    Annual Wellness Assessment  Reports health as is always worse orgood  Preventive Screening -Counseling & Management  Medicare Annual Preventive Care Visit - Subsequent Last OV last year; Jan was seen for flu  Describes Health as poor, fair, good or great? .very good  Reviewed chol 243 discussed cholesterol levels. Overall HDL is high. Colonoscopy 10/2008 Mammogram does not have these anymore Bone density was checked when she had her fx  Patient had a fracture approximately 8 years ago. States she was told she had osteoporosis. No report seen.  Has son at home with da/  Spouse died x 47 yo Takes walks when time  There is plan for d/a son; ( 4)    VS reviewed;   Diet eats 3 meals a day and does not overeat portion control   BMI 20  Exercise HDL 82; tri 147  Dental not in awhile but has a dentist   Stressors:  Not excessive; has to manage the home  Still living in one level Has one dtr and 2 son living here  Sleep patterns: at times; changed since menopause  Pain; not disabling        Advanced Directives the patient has completed her advanced directives.  Patient Care Team: Marin Olp, MD as PCP - General (Family Medicine) Assessed for additional providers  Immunization History  Administered Date(s) Administered  . Influenza Whole 06/05/2010   Required Immunizations needed today The fully vaccine is not available today.  Screening test up to date or reviewed for plan of completion There are no preventive care reminders to display for this patient.         Cardiac Risk Factors include: advanced age (>79men, >55 women);family history of  premature cardiovascular disease     Objective:     Vitals: BP 118/78   Ht 5\' 5"  (1.651 m)   Wt 120 lb (54.4 kg)   BMI 19.97 kg/m   Body mass index is 19.97 kg/m.   Tobacco History  Smoking Status  . Never Smoker  Smokeless Tobacco  . Never Used     Counseling given: Not Answered   Past Medical History:  Diagnosis Date  . History of colonic polyps   . Hyperlipidemia   . Osteoporosis    Past Surgical History:  Procedure Laterality Date  . CHOLECYSTECTOMY     at time of colectomy  . RIGHT COLECTOMY     adenomatous polyp   Family History  Problem Relation Age of Onset  . Colon cancer Brother        colon  . Heart disease Mother        unclear history  . Diabetes Sister   . Lung cancer Father    History  Sexual Activity  . Sexual activity: Not on file    Outpatient Encounter Prescriptions as of 03/27/2017  Medication Sig  . calcium-vitamin D (OSCAL WITH D) 500-200 MG-UNIT per tablet Take 1 tablet by mouth daily.   No facility-administered encounter medications on file as of 03/27/2017.     Activities of Daily Living In your present state of health, do you have any difficulty performing the following activities: 03/27/2017  Hearing? N  Vision? N  Difficulty concentrating or making decisions? N  Walking or climbing stairs? N  Dressing or bathing? N  Doing errands, shopping? N  Preparing Food and eating ? N  Using the Toilet? N  In the past six months, have you accidently leaked urine? N  Do you have problems with loss of bowel control? N  Managing your Medications? N  Managing your Finances? N  Housekeeping or managing your Housekeeping? N  Some recent data might be hidden    Patient Care Team: Marin Olp, MD as PCP - General (Family Medicine)    Assessment:    Mrs. Eckford is very engaging. Mental status appropriate. No complaints at this time. An overall very good health.  Exercise Activities and Dietary recommendations Current Exercise  Habits: Home exercise routine, Time (Minutes): 60, Frequency (Times/Week): 5, Weekly Exercise (Minutes/Week): 300, Intensity: Mild  Goals    . patient           Go on vacation!        Fall Risk Fall Risk  03/27/2017 12/29/2015 09/07/2014  Falls in the past year? No No No   Depression Screen PHQ 2/9 Scores 03/27/2017 12/29/2015 09/07/2014  PHQ - 2 Score 0 0 0     Cognitive Function   Ad8 score reviewed for issues:  Issues making decisions:  Less interest in hobbies / activities:  Repeats questions, stories (family complaining):  Trouble using ordinary gadgets (microwave, computer, phone):  Forgets the month or year:   Mismanaging finances:   Remembering appts:  Daily problems with thinking and/or memory: Ad8 score is=0          Immunization History  Administered Date(s) Administered  . Influenza Whole 06/05/2010   Screening Tests Health Maintenance  Topic Date Due  . INFLUENZA VACCINE  09/07/2024 (Originally 03/20/2017)  . DEXA SCAN  09/07/2024 (Originally 08/23/2001)  . TETANUS/TDAP  09/07/2024 (Originally 08/24/1953)  . PNA vac Low Risk Adult (1 of 2 - PCV13) 03/24/2029 (Originally 08/23/2001)      Plan:     PCP Notes   Health Maintenance The patient agrees to take her flu vaccine this year when available Discussed DEXA scan. Given information on the osteoporosis foundation site. The patient will review that side and discussed DEXA with Dr. Yong Channel. States that she feels sure she will not take any medication. She is quite active throughout the day with housekeeping and yard chores. She does supplement calcium and vitamin D.  Abnormal Screens  No abnormal screens noted today.  Referrals  No referrals today although she does have some minor hearing loss.  Patient concerns; The patient appears to have no concerns today.  Nurse Concerns; The patient will schedule with Dr. Yong Channel for her medical follow-up as well as have her labs drawn.  Next PCP apt The  patient is scheduling an appointment at her discharge today.       I have personally reviewed and noted the following in the patient's chart:   . Medical and social history . Use of alcohol, tobacco or illicit drugs  . Current medications and supplements . Functional ability and status . Nutritional status . Physical activity . Advanced directives . List of other physicians . Hospitalizations, surgeries, and ER visits in previous 12 months . Vitals . Screenings to include cognitive, depression, and falls . Referrals and appointments  In addition, I have reviewed and discussed with patient certain preventive protocols, quality metrics, and best practice recommendations. A written personalized care plan for preventive services as well  as general preventive health recommendations were provided to patient.     Wynetta Fines, RN  03/27/2017

## 2017-03-27 NOTE — Patient Instructions (Addendum)
Sheila Wood , Thank you for taking time to come for your Medicare Wellness Visit. I appreciate your ongoing commitment to your health goals. Please review the following plan we discussed and let me know if I can assist you in the future.   Will make apt with Dr. Yong Channel for annual labs  Will take her flu vaccine this fall!!  Please review the information on the osteoporosis site below and you can discuss with Dr. Yong Channel Recommendations for Dexa Scan Female over the age of 80 Man age 9 or older If you broke a bone past the age of 87 Women menopausal age with risk factors (thin frame; smoker; hx of fx ) Post menopausal women under the age of 58 with risk factors A man age 30 to 62 with risk factors Other: Spine xray that is showing break of bone loss Back pain with possible break Height loss of 1/2 inch or more within one year Total loss in height of 1.5 inches from your original height  Calcium '1200mg'$  with Vit D 800u per day; more as directed by physician Strength building exercises discussed; can include walking; housework; small weights or stretch bands; silver sneakers [facility]  Please visit the osteoporosis [email or web address] for up to date recommendations    These are the goals we discussed: Goals    . patient           Go on vacation!         This is a list of the screening recommended for you and due dates:  Health Maintenance  Topic Date Due  . Flu Shot  09/07/2024*  . DEXA scan (bone density measurement)  09/07/2024*  . Tetanus Vaccine  09/07/2024*  . Pneumonia vaccines (1 of 2 - PCV13) 03/24/2029*  *Topic was postponed. The date shown is not the original due date.    Fall Prevention in the Home Falls can cause injuries and can affect people from all age groups. There are many simple things that you can do to make your home safe and to help prevent falls. What can I do on the outside of my home?  Regularly repair the edges of walkways and driveways and  fix any cracks.  Remove high doorway thresholds.  Trim any shrubbery on the main path into your home.  Use bright outdoor lighting.  Clear walkways of debris and clutter, including tools and rocks.  Regularly check that handrails are securely fastened and in good repair. Both sides of any steps should have handrails.  Install guardrails along the edges of any raised decks or porches.  Have leaves, snow, and ice cleared regularly.  Use sand or salt on walkways during winter months.  In the garage, clean up any spills right away, including grease or oil spills. What can I do in the bathroom?  Use night lights.  Install grab bars by the toilet and in the tub and shower. Do not use towel bars as grab bars.  Use non-skid mats or decals on the floor of the tub or shower.  If you need to sit down while you are in the shower, use a plastic, non-slip stool.  Keep the floor dry. Immediately clean up any water that spills on the floor.  Remove soap buildup in the tub or shower on a regular basis.  Attach bath mats securely with double-sided non-slip rug tape.  Remove throw rugs and other tripping hazards from the floor. What can I do in the bedroom?  Use  night lights.  Make sure that a bedside light is easy to reach.  Do not use oversized bedding that drapes onto the floor.  Have a firm chair that has side arms to use for getting dressed.  Remove throw rugs and other tripping hazards from the floor. What can I do in the kitchen?  Clean up any spills right away.  Avoid walking on wet floors.  Place frequently used items in easy-to-reach places.  If you need to reach for something above you, use a sturdy step stool that has a grab bar.  Keep electrical cables out of the way.  Do not use floor polish or wax that makes floors slippery. If you have to use wax, make sure that it is non-skid floor wax.  Remove throw rugs and other tripping hazards from the floor. What can  I do in the stairways?  Do not leave any items on the stairs.  Make sure that there are handrails on both sides of the stairs. Fix handrails that are broken or loose. Make sure that handrails are as long as the stairways.  Check any carpeting to make sure that it is firmly attached to the stairs. Fix any carpet that is loose or worn.  Avoid having throw rugs at the top or bottom of stairways, or secure the rugs with carpet tape to prevent them from moving.  Make sure that you have a light switch at the top of the stairs and the bottom of the stairs. If you do not have them, have them installed. What are some other fall prevention tips?  Wear closed-toe shoes that fit well and support your feet. Wear shoes that have rubber soles or low heels.  When you use a stepladder, make sure that it is completely opened and that the sides are firmly locked. Have someone hold the ladder while you are using it. Do not climb a closed stepladder.  Add color or contrast paint or tape to grab bars and handrails in your home. Place contrasting color strips on the first and last steps.  Use mobility aids as needed, such as canes, walkers, scooters, and crutches.  Turn on lights if it is dark. Replace any light bulbs that burn out.  Set up furniture so that there are clear paths. Keep the furniture in the same spot.  Fix any uneven floor surfaces.  Choose a carpet design that does not hide the edge of steps of a stairway.  Be aware of any and all pets.  Review your medicines with your healthcare provider. Some medicines can cause dizziness or changes in blood pressure, which increase your risk of falling. Talk with your health care provider about other ways that you can decrease your risk of falls. This may include working with a physical therapist or trainer to improve your strength, balance, and endurance. This information is not intended to replace advice given to you by your health care provider. Make  sure you discuss any questions you have with your health care provider. Document Released: 07/27/2002 Document Revised: 01/03/2016 Document Reviewed: 09/10/2014 Elsevier Interactive Patient Education  2017 [place] Maintenance, Female Adopting a healthy lifestyle and getting preventive care can go a long way to promote health and wellness. Talk with your health care provider about what schedule of regular examinations is right for you. This is a good chance for you to check in with your provider about disease prevention and staying healthy. In between checkups, there are plenty of  things you can do on your own. Experts have done a lot of research about which lifestyle changes and preventive measures are most likely to keep you healthy. Ask your health care provider for more information. Weight and diet Eat a healthy diet  Be sure to include plenty of vegetables, fruits, low-fat dairy products, and lean protein.  Do not eat a lot of foods high in solid fats, added sugars, or salt.  Get regular exercise. This is one of the most important things you can do for your health. ? Most adults should exercise for at least 150 minutes each week. The exercise should increase your heart rate and make you sweat (moderate-intensity exercise). ? Most adults should also do strengthening exercises at least twice a week. This is in addition to the moderate-intensity exercise.  Maintain a healthy weight  Body mass index (BMI) is a measurement that can be used to identify possible weight problems. It estimates body fat based on height and weight. Your health care provider can help determine your BMI and help you achieve or maintain a healthy weight.  For females 41 years of age and older: ? A BMI below 18.5 is considered underweight. ? A BMI of 18.5 to 24.9 is normal. ? A BMI of 25 to 29.9 is considered overweight. ? A BMI of 30 and above is considered obese.  Watch levels of cholesterol and  blood lipids  You should start having your blood tested for lipids and cholesterol at 80 years of age, then have this test every 5 years.  You may need to have your cholesterol levels checked more often if: ? Your lipid or cholesterol levels are high. ? You are older than 80 years of age. ? You are at high risk for heart disease.  Cancer screening Lung Cancer  Lung cancer screening is recommended for adults 38-57 years old who are at high risk for lung cancer because of a history of smoking.  A yearly low-dose CT scan of the lungs is recommended for people who: ? Currently smoke. ? Have quit within the past 15 years. ? Have at least a 30-pack-year history of smoking. A pack year is smoking an average of one pack of cigarettes a day for 1 year.  Yearly screening should continue until it has been 15 years since you quit.  Yearly screening should stop if you develop a health problem that would prevent you from having lung cancer treatment.  Breast Cancer  Practice breast self-awareness. This means understanding how your breasts normally appear and feel.  It also means doing regular breast self-exams. Let your health care provider know about any changes, no matter how small.  If you are in your 20s or 30s, you should have a clinical breast exam (CBE) by a health care provider every 1-3 years as part of a regular health exam.  If you are 107 or older, have a CBE every year. Also consider having a breast X-ray (mammogram) every year.  If you have a family history of breast cancer, talk to your health care provider about genetic screening.  If you are at high risk for breast cancer, talk to your health care provider about having an MRI and a mammogram every year.  Breast cancer gene (BRCA) assessment is recommended for women who have family members with BRCA-related cancers. BRCA-related cancers include: ? Breast. ? Ovarian. ? Tubal. ? Peritoneal cancers.  Results of the assessment  will determine the need for genetic counseling and BRCA1 and  BRCA2 testing.  Cervical Cancer Your health care provider may recommend that you be screened regularly for cancer of the pelvic organs (ovaries, uterus, and vagina). This screening involves a pelvic examination, including checking for microscopic changes to the surface of your cervix (Pap test). You may be encouraged to have this screening done every 3 years, beginning at age 18.  For women ages 1-65, health care providers may recommend pelvic exams and Pap testing every 3 years, or they may recommend the Pap and pelvic exam, combined with testing for human papilloma virus (HPV), every 5 years. Some types of HPV increase your risk of cervical cancer. Testing for HPV may also be done on women of any age with unclear Pap test results.  Other health care providers may not recommend any screening for nonpregnant women who are considered low risk for pelvic cancer and who do not have symptoms. Ask your health care provider if a screening pelvic exam is right for you.  If you have had past treatment for cervical cancer or a condition that could lead to cancer, you need Pap tests and screening for cancer for at least 20 years after your treatment. If Pap tests have been discontinued, your risk factors (such as having a new sexual partner) need to be reassessed to determine if screening should resume. Some women have medical problems that increase the chance of getting cervical cancer. In these cases, your health care provider may recommend more frequent screening and Pap tests.  Colorectal Cancer  This type of cancer can be detected and often prevented.  Routine colorectal cancer screening usually begins at 80 years of age and continues through 80 years of age.  Your health care provider may recommend screening at an earlier age if you have risk factors for colon cancer.  Your health care provider may also recommend using home test kits to  check for hidden blood in the stool.  A small camera at the end of a tube can be used to examine your colon directly (sigmoidoscopy or colonoscopy). This is done to check for the earliest forms of colorectal cancer.  Routine screening usually begins at age 101.  Direct examination of the colon should be repeated every 5-10 years through 80 years of age. However, you may need to be screened more often if early forms of precancerous polyps or small growths are found.  Skin Cancer  Check your skin from head to toe regularly.  Tell your health care provider about any new moles or changes in moles, especially if there is a change in a mole's shape or color.  Also tell your health care provider if you have a mole that is larger than the size of a pencil eraser.  Always use sunscreen. Apply sunscreen liberally and repeatedly throughout the day.  Protect yourself by wearing long sleeves, pants, a wide-brimmed hat, and sunglasses whenever you are outside.  Heart disease, diabetes, and high blood pressure  High blood pressure causes heart disease and increases the risk of stroke. High blood pressure is more likely to develop in: ? People who have blood pressure in the high end of the normal range (130-139/85-89 mm Hg). ? People who are overweight or obese. ? People who are African American.  If you are 24-33 years of age, have your blood pressure checked every 3-5 years. If you are 33 years of age or older, have your blood pressure checked every year. You should have your blood pressure measured twice-once when you are at  a hospital or clinic, and once when you are not at a hospital or clinic. Record the average of the two measurements. To check your blood pressure when you are not at a hospital or clinic, you can use: ? An automated blood pressure machine at a pharmacy. ? A home blood pressure monitor.  If you are between 68 years and 15 years old, ask your health care provider if you should  take aspirin to prevent strokes.  Have regular diabetes screenings. This involves taking a blood sample to check your fasting blood sugar level. ? If you are at a normal weight and have a low risk for diabetes, have this test once every three years after 80 years of age. ? If you are overweight and have a high risk for diabetes, consider being tested at a younger age or more often. Preventing infection Hepatitis B  If you have a higher risk for hepatitis B, you should be screened for this virus. You are considered at high risk for hepatitis B if: ? You were born in a country where hepatitis B is common. Ask your health care provider which countries are considered high risk. ? Your parents were born in a high-risk country, and you have not been immunized against hepatitis B (hepatitis B vaccine). ? You have HIV or AIDS. ? You use needles to inject street drugs. ? You live with someone who has hepatitis B. ? You have had sex with someone who has hepatitis B. ? You get hemodialysis treatment. ? You take certain medicines for conditions, including cancer, organ transplantation, and autoimmune conditions.  Hepatitis C  Blood testing is recommended for: ? Everyone born from [birth year] through 1963. ? Anyone with known risk factors for hepatitis C.  Sexually transmitted infections (STIs)  You should be screened for sexually transmitted infections (STIs) including gonorrhea and chlamydia if: ? You are sexually active and are younger than 80 years of age. ? You are older than 80 years of age and your health care provider tells you that you are at risk for this type of infection. ? Your sexual activity has changed since you were last screened and you are at an increased risk for chlamydia or gonorrhea. Ask your health care provider if you are at risk.  If you do not have HIV, but are at risk, it may be recommended that you take a prescription medicine daily to prevent HIV infection. This is called  pre-exposure prophylaxis (PrEP). You are considered at risk if: ? You are sexually active and do not regularly use condoms or know the HIV status of your partner(s). ? You take drugs by injection. ? You are sexually active with a partner who has HIV.  Talk with your health care provider about whether you are at high risk of being infected with HIV. If you choose to begin PrEP, you should first be tested for HIV. You should then be tested every 3 months for as long as you are taking PrEP. Pregnancy  If you are premenopausal and you may become pregnant, ask your health care provider about preconception counseling.  If you may become pregnant, take 400 to 800 micrograms (mcg) of folic acid every day.  If you want to prevent pregnancy, talk to your health care provider about birth control (contraception). Osteoporosis and menopause  Osteoporosis is a disease in which the bones lose minerals and strength with aging. This can result in serious bone fractures. Your risk for osteoporosis can be identified using a  bone density scan.  If you are 7 years of age or older, or if you are at risk for osteoporosis and fractures, ask your health care provider if you should be screened.  Ask your health care provider whether you should take a calcium or vitamin D supplement to lower your risk for osteoporosis.  Menopause may have certain physical symptoms and risks.  Hormone replacement therapy may reduce some of these symptoms and risks. Talk to your health care provider about whether hormone replacement therapy is right for you. Follow these instructions at home:  Schedule regular health, dental, and eye exams.  Stay current with your immunizations.  Do not use any tobacco products including cigarettes, chewing tobacco, or electronic cigarettes.  If you are pregnant, do not drink alcohol.  If you are breastfeeding, limit how much and how often you drink alcohol.  Limit alcohol intake to no more  than 1 drink per day for nonpregnant women. One drink equals 12 ounces of beer, 5 ounces of wine, or 1 ounces of hard liquor.  Do not use street drugs.  Do not share needles.  Ask your health care provider for help if you need support or information about quitting drugs.  Tell your health care provider if you often feel depressed.  Tell your health care provider if you have ever been abused or do not feel safe at home. This information is not intended to replace advice given to you by your health care provider. Make sure you discuss any questions you have with your health care provider. Document Released: 02/19/2011 Document Revised: 01/12/2016 Document Reviewed: 05/10/2015 Elsevier Interactive Patient Education  [employer].

## 2017-03-27 NOTE — Progress Notes (Signed)
I have reviewed and agree with note, evaluation, plan.   Stephen Hunter, MD  

## 2017-04-19 ENCOUNTER — Encounter: Payer: Self-pay | Admitting: Internal Medicine

## 2017-04-19 ENCOUNTER — Encounter: Payer: Self-pay | Admitting: Family Medicine

## 2017-04-19 ENCOUNTER — Ambulatory Visit (INDEPENDENT_AMBULATORY_CARE_PROVIDER_SITE_OTHER): Payer: Medicare Other | Admitting: Family Medicine

## 2017-04-19 VITALS — BP 128/76 | HR 57 | Temp 98.0°F | Ht 65.0 in | Wt 119.0 lb

## 2017-04-19 DIAGNOSIS — Z86010 Personal history of colonic polyps: Secondary | ICD-10-CM | POA: Diagnosis not present

## 2017-04-19 DIAGNOSIS — Z0000 Encounter for general adult medical examination without abnormal findings: Secondary | ICD-10-CM | POA: Diagnosis not present

## 2017-04-19 DIAGNOSIS — M810 Age-related osteoporosis without current pathological fracture: Secondary | ICD-10-CM

## 2017-04-19 DIAGNOSIS — E785 Hyperlipidemia, unspecified: Secondary | ICD-10-CM | POA: Diagnosis not present

## 2017-04-19 LAB — COMPREHENSIVE METABOLIC PANEL
ALT: 13 U/L (ref 0–35)
AST: 17 U/L (ref 0–37)
Albumin: 4.3 g/dL (ref 3.5–5.2)
Alkaline Phosphatase: 109 U/L (ref 39–117)
BILIRUBIN TOTAL: 0.6 mg/dL (ref 0.2–1.2)
BUN: 14 mg/dL (ref 6–23)
CALCIUM: 9.8 mg/dL (ref 8.4–10.5)
CO2: 31 meq/L (ref 19–32)
Chloride: 101 mEq/L (ref 96–112)
Creatinine, Ser: 0.77 mg/dL (ref 0.40–1.20)
GFR: 76.54 mL/min (ref >60.00)
Glucose, Bld: 91 mg/dL (ref 70–99)
POTASSIUM: 5 meq/L (ref 3.5–5.1)
SODIUM: 138 meq/L (ref 135–145)
Total Protein: 6.7 g/dL (ref 6.0–8.3)

## 2017-04-19 LAB — LIPID PANEL
CHOLESTEROL: 246 mg/dL — AB (ref 0–200)
HDL: 78.9 mg/dL (ref >39.00)
LDL CALC: 131 mg/dL — AB (ref 0–99)
NonHDL: 167.1
Total CHOL/HDL Ratio: 3
Triglycerides: 181 mg/dL — ABNORMAL HIGH (ref 0.0–149.0)
VLDL: 36.2 mg/dL (ref 0.0–40.0)

## 2017-04-19 LAB — CBC
HEMATOCRIT: 42.9 % (ref 36.0–46.0)
Hemoglobin: 14 g/dL (ref 12.0–15.0)
MCHC: 32.7 g/dL (ref 30.0–36.0)
MCV: 93.6 fl (ref 78.0–100.0)
PLATELETS: 253 10*3/uL (ref 150.0–400.0)
RBC: 4.59 Mil/uL (ref 3.87–5.11)
RDW: 13.8 % (ref 11.5–15.5)
WBC: 5.5 10*3/uL (ref 4.0–10.5)

## 2017-04-19 NOTE — Patient Instructions (Addendum)
Please stop by lab before you go  See dental resources  We will call you within a week or two about your referral to [facility] for potential colonoscopy. If you do not hear within 3 weeks, give [place] a call.   I would try to eat a little more- gaining 5 lbs or so would be great for you in case you get sick at some point  I would also like for you to sign up for an annual wellness visit with our nurse, Manuela Schwartz, who specializes in the annual wellness exam. This is a free benefit under medicare that may help [place] find additional ways to help you. Some highlights are reviewing medications, lifestyle, and doing a dementia screen.

## 2017-04-19 NOTE — Assessment & Plan Note (Signed)
Osteoporosis- would decline medication and therefore declines dexa. Discussed regular weight bearing exercise and continue calcium and vitamin D 1000 units a day

## 2017-04-19 NOTE — Assessment & Plan Note (Signed)
HLD- no clear indication for primary prevention at her age, update lipids

## 2017-04-19 NOTE — Progress Notes (Signed)
Phone: [phone]  Subjective:  Patient presents today for their annual physical. Chief complaint-noted.   See problem oriented charting- ROS- full  review of systems was completed and negative including No chest pain or shortness of breath. No headache or blurry vision.   The following were reviewed and entered/updated in epic: Past Medical History:  Diagnosis Date  . History of colonic polyps   . Hyperlipidemia   . Osteoporosis    Patient Active Problem List   Diagnosis Date Noted  . Hyperlipidemia 05/05/2007    Priority: Medium  . Osteoporosis 05/05/2007    Priority: Medium  . Glaucoma 03/12/2016    Priority: Low  . History of colonic polyps 05/05/2007    Priority: Low   Past Surgical History:  Procedure Laterality Date  . CHOLECYSTECTOMY     at time of colectomy  . RIGHT COLECTOMY     adenomatous polyp    Family History  Problem Relation Age of Onset  . Colon cancer Brother        colon  . Heart disease Mother        unclear history  . Diabetes Sister   . Lung cancer Father     Medications- reviewed and updated Current Outpatient Prescriptions  Medication Sig Dispense Refill  . calcium-vitamin D (OSCAL WITH D) 500-200 MG-UNIT per tablet Take 1 tablet by mouth daily.     No current facility-administered medications for this visit.     Allergies-reviewed and updated Allergies  Allergen Reactions  . Penicillins Rash  . Aspirin     Social History   Social History  . Marital status: Married    Spouse name: N/A  . Number of children: N/A  . Years of education: N/A   Social History Main Topics  . Smoking status: Never Smoker  . Smokeless tobacco: Never Used  . Alcohol use 1.2 oz/week    2 Standard drinks or equivalent per week     Comment: glass of wine- rarely uses  . Drug use: No  . Sexual activity: Not Asked   Other Topics Concern  . None   Social History Narrative   Widowed in Oct 2014. Lives alone. 3 children (daughter, 2 sons) all  live close-sees regularly. 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 Oropharynx normal Plaque noted on teeth at bases- some gum regresion Neck: no thyromegaly CV: RRR, not bradycardic on my exam, no murmurs rubs or gallops Lungs: CTAB no crackles, wheeze, rhonchi Abdomen: soft/nontender/nondistended/normal bowel sounds. No rebound or guarding.  Ext: no edema Skin: warm, dry Neuro: grossly normal, moves all extremities, PERRLA  Assessment/Plan:  82 y.o. female presenting for annual physical.  Health Maintenance counseling: 1. Anticipatory guidance: Patient counseled regarding regular dental exams - advised q6 months- doesn't go unless has issues though, eye exams sees Dr. Bing Plume yearly, wearing seatbelts.  2. Risk factor reduction:  Advised patient of need for regular  exercise and diet rich and fruits and vegetables to reduce risk of heart attack and stroke. Exercise- still walking on daily basis unless severe heat. Diet-balanced diet. Actually discussed increasing calorie intake Wt Readings from Last 3 Encounters:  04/19/17 119 lb (54 kg)  03/27/17 120 lb (54.4 kg)  09/14/16 121 lb 12.8 oz (55.2 kg)  3. Immunizations/screenings/ancillary studies- advised fall flu shot - declines for now- after having flu last year may consider getting later. Declines pneumonia shots again- seems more open at least this year.  Advised shingrix at pharmacy - declines Immunization History  Administered Date(s)  Administered  . Influenza Whole 06/05/2010  4. Cervical cancer screening- aged out per usptf guidelines 5. Breast cancer screening-  Aged out per usptf guidelines- does self exams and no concerns 6. Colon cancer screening - she has declined follow up colonoscopy that has been advised since 2015 due to adenoma. Stool cards 03/20/16 negative- discussed this is not ideal follow up. She does agree to colonoscopy today! Will refer today 7. Skin cancer screening- [facility] sees them yearly  Status of chronic or acute concerns   Osteoporosis Osteoporosis- would decline medication and therefore declines dexa. Discussed regular weight bearing exercise and continue calcium and vitamin D 1000 units a day  Hyperlipidemia HLD- no clear indication for primary prevention at her age, update lipids  History of adenomatous polyp of colon - Plan: Ambulatory referral to Gastroenterology. 2015 recall later advised follow up. This will be reviewed by Dr. Carlean Purl prior to potential repeat colonoscopy.   Gave dental resources- due to plaque build up, cost, and poor care  Orders Placed This Encounter  Procedures  . CBC    [place]  . Comprehensive metabolic panel    [place]    Order Specific Question:   Has the patient fasted?    Answer:   No  . Lipid panel    [place]    Order Specific Question:   Has the patient fasted?    Answer:   No  . Ambulatory referral to Gastroenterology    Referral Priority:   Routine    Referral Type:   Consultation    Referral Reason:   Specialty Services Required    Referred to Provider:   Gatha Mayer, MD    Number of Visits Requested:   1   Return precautions advised.  Garret Reddish, MD

## 2017-05-01 DIAGNOSIS — H2513 Age-related nuclear cataract, bilateral: Secondary | ICD-10-CM | POA: Diagnosis not present

## 2017-05-01 DIAGNOSIS — H01001 Unspecified blepharitis right upper eyelid: Secondary | ICD-10-CM | POA: Diagnosis not present

## 2017-05-01 DIAGNOSIS — H401122 Primary open-angle glaucoma, left eye, moderate stage: Secondary | ICD-10-CM | POA: Diagnosis not present

## 2017-05-01 DIAGNOSIS — H401111 Primary open-angle glaucoma, right eye, mild stage: Secondary | ICD-10-CM | POA: Diagnosis not present

## 2017-06-10 DIAGNOSIS — H401122 Primary open-angle glaucoma, left eye, moderate stage: Secondary | ICD-10-CM | POA: Diagnosis not present

## 2017-06-10 DIAGNOSIS — H01001 Unspecified blepharitis right upper eyelid: Secondary | ICD-10-CM | POA: Diagnosis not present

## 2017-06-10 DIAGNOSIS — H401111 Primary open-angle glaucoma, right eye, mild stage: Secondary | ICD-10-CM | POA: Diagnosis not present

## 2017-06-10 DIAGNOSIS — H2513 Age-related nuclear cataract, bilateral: Secondary | ICD-10-CM | POA: Diagnosis not present

## 2017-06-17 ENCOUNTER — Encounter: Payer: Self-pay | Admitting: Family Medicine

## 2017-06-19 ENCOUNTER — Ambulatory Visit: Payer: Medicare Other | Admitting: Internal Medicine

## 2017-07-01 ENCOUNTER — Ambulatory Visit (INDEPENDENT_AMBULATORY_CARE_PROVIDER_SITE_OTHER): Payer: Medicare Other

## 2017-07-01 DIAGNOSIS — Z23 Encounter for immunization: Secondary | ICD-10-CM

## 2017-07-02 DIAGNOSIS — H43813 Vitreous degeneration, bilateral: Secondary | ICD-10-CM | POA: Diagnosis not present

## 2017-07-02 DIAGNOSIS — H2513 Age-related nuclear cataract, bilateral: Secondary | ICD-10-CM | POA: Diagnosis not present

## 2017-07-02 DIAGNOSIS — H35373 Puckering of macula, bilateral: Secondary | ICD-10-CM | POA: Diagnosis not present

## 2017-07-02 DIAGNOSIS — H35432 Paving stone degeneration of retina, left eye: Secondary | ICD-10-CM | POA: Diagnosis not present

## 2017-08-26 DIAGNOSIS — H401111 Primary open-angle glaucoma, right eye, mild stage: Secondary | ICD-10-CM | POA: Diagnosis not present

## 2017-08-26 DIAGNOSIS — H53452 Other localized visual field defect, left eye: Secondary | ICD-10-CM | POA: Diagnosis not present

## 2017-08-26 DIAGNOSIS — H04123 Dry eye syndrome of bilateral lacrimal glands: Secondary | ICD-10-CM | POA: Diagnosis not present

## 2017-08-26 DIAGNOSIS — H401122 Primary open-angle glaucoma, left eye, moderate stage: Secondary | ICD-10-CM | POA: Diagnosis not present

## 2017-09-24 DIAGNOSIS — H2512 Age-related nuclear cataract, left eye: Secondary | ICD-10-CM | POA: Diagnosis not present

## 2017-10-01 DIAGNOSIS — H59212 Accidental puncture and laceration of left eye and adnexa during an ophthalmic procedure: Secondary | ICD-10-CM | POA: Diagnosis not present

## 2017-10-01 DIAGNOSIS — H401420 Capsular glaucoma with pseudoexfoliation of lens, left eye, stage unspecified: Secondary | ICD-10-CM | POA: Diagnosis not present

## 2017-10-01 DIAGNOSIS — H25812 Combined forms of age-related cataract, left eye: Secondary | ICD-10-CM | POA: Diagnosis not present

## 2017-10-01 DIAGNOSIS — H2512 Age-related nuclear cataract, left eye: Secondary | ICD-10-CM | POA: Diagnosis not present

## 2017-10-09 DIAGNOSIS — H2511 Age-related nuclear cataract, right eye: Secondary | ICD-10-CM | POA: Diagnosis not present

## 2017-10-24 ENCOUNTER — Ambulatory Visit: Payer: Self-pay | Admitting: *Deleted

## 2017-10-24 NOTE — Telephone Encounter (Signed)
Pt    Has  Had inner  Ear   In   Past   She  Reports last pm  She  Developed  Some  dizziness  When she  Bent  Down  To clean her  Oven  . She  Reports  Dizziness/ vertigo  is  Worse  When she  Moves  Her head . She  States  When she  Walks  straight  She  Has  No  Symptoms.  Denies  Any  Weakness  Speech is  Clear . She is  Alert  And  Oriented   Has  Had  In past  About 22  Years  Ago  And  Was  DX  With  Inner ear .   Appointment  Made  tomorrow  With Dr  Juleen China Pt  Advised  To  Stand  Up  Slowly  Avoid  Head  Movements  As  Much  As  Possible. Call back if any  Problems  Or call 911  If  Severe Advised  Not to  Drive    Reason for Disposition . [1] NO dizziness now AND [2] age > 44  Answer Assessment - Initial Assessment Questions 1. DESCRIPTION: "Describe your dizziness."       Has  Had in past   Worse  Last  Pm   Worse  When  Moves  Her  Head     2. VERTIGO: "Do you feel like either you or the room is spinning or tilting?"        Spinning or  Tilting    3. LIGHTHEADED: "Do you feel lightheaded?" (e.g., somewhat faint, woozy, weak upon standing)     Yes   4. SEVERITY: "How bad is it?"  "Can you walk?"   - MILD - Feels unsteady but walking normally.   - MODERATE - Feels very unsteady when walking, but not falling; interferes with normal activities (e.g., school, work) .   - SEVERE - Unable to walk without falling (requires assistance).       Mild   Unless  She   Moves  Her  Head    5. ONSET:  "When did the dizziness begin?"      Last  Night     6. AGGRAVATING FACTORS: "Does anything make it worse?" (e.g., standing, change in head position)     Change  In head  position    7. CAUSE: "What do you think is causing the dizziness?"        Inner  Ear    8. RECURRENT SYMPTOM: "Have you had dizziness before?" If so, ask: "When was the last time?" "What happened that time?"      Has  Had  In  Past   8  Years   Years  Ago  Was   Inner ear   9. OTHER SYMPTOMS: "Do you have any other symptoms?"  (e.g., headache, weakness, numbness, vomiting, earache)       No  Feels  Slightly  Nauseated     10. PREGNANCY: "Is there any chance you are pregnant?" "When was your last menstrual period?"       n/a  Protocols used: DIZZINESS - VERTIGO-A-AH

## 2017-10-24 NOTE — Telephone Encounter (Signed)
noted 

## 2017-10-24 NOTE — Telephone Encounter (Signed)
See note

## 2017-10-25 ENCOUNTER — Ambulatory Visit: Payer: Medicare Other | Admitting: Family Medicine

## 2017-10-25 ENCOUNTER — Encounter: Payer: Self-pay | Admitting: Family Medicine

## 2017-10-25 VITALS — BP 130/74 | HR 67 | Temp 98.6°F | Wt 119.2 lb

## 2017-10-25 DIAGNOSIS — H8110 Benign paroxysmal vertigo, unspecified ear: Secondary | ICD-10-CM | POA: Diagnosis not present

## 2017-10-25 NOTE — Patient Instructions (Addendum)
Benign Positional Vertigo Vertigo is the feeling that you or your surroundings are moving when they are not. Benign positional vertigo is the most common form of vertigo. The cause of this condition is not serious (is benign). This condition is triggered by certain movements and positions (is positional). This condition can be dangerous if it occurs while you are doing something that could endanger you or others, such as driving. What are the causes? In many cases, the cause of this condition is not known. It may be caused by a disturbance in an area of the inner ear that helps your brain to sense movement and balance. This disturbance can be caused by a viral infection (labyrinthitis), head injury, or repetitive motion. What increases the risk? This condition is more likely to develop in:  Women.  People who are 50 years of age or older.  What are the signs or symptoms? Symptoms of this condition usually happen when you move your head or your eyes in different directions. Symptoms may start suddenly, and they usually last for less than a minute. Symptoms may include:  Loss of balance and falling.  Feeling like you are spinning or moving.  Feeling like your surroundings are spinning or moving.  Nausea and vomiting.  Blurred vision.  Dizziness.  Involuntary eye movement (nystagmus).  Symptoms can be mild and cause only slight annoyance, or they can be severe and interfere with daily life. Episodes of benign positional vertigo may return (recur) over time, and they may be triggered by certain movements. Symptoms may improve over time. How is this diagnosed? This condition is usually diagnosed by medical history and a physical exam of the head, neck, and ears. You may be referred to a health care provider who specializes in ear, nose, and throat (ENT) problems (otolaryngologist) or a provider who specializes in disorders of the nervous system (neurologist). You may have additional testing,  including:  MRI.  A CT scan.  Eye movement tests. Your health care provider may ask you to change positions quickly while he or she watches you for symptoms of benign positional vertigo, such as nystagmus. Eye movement may be tested with an electronystagmogram (ENG), caloric stimulation, the Dix-Hallpike test, or the roll test.  An electroencephalogram (EEG). This records electrical activity in your brain.  Hearing tests.  How is this treated? Usually, your health care provider will treat this by moving your head in specific positions to adjust your inner ear back to normal. Surgery may be needed in severe cases, but this is rare. In some cases, benign positional vertigo may resolve on its own in 2-4 weeks. Follow these instructions at home: Safety  Move slowly.Avoid sudden body or head movements.  Avoid driving.  Avoid operating heavy machinery.  Avoid doing any tasks that would be dangerous to you or others if a vertigo episode would occur.  If you have trouble walking or keeping your balance, try using a cane for stability. If you feel dizzy or unstable, sit down right away.  Return to your normal activities as told by your health care provider. Ask your health care provider what activities are safe for you. General instructions  Take over-the-counter and prescription medicines only as told by your health care provider.  Avoid certain positions or movements as told by your health care provider.  Drink enough fluid to keep your urine clear or pale yellow.  Keep all follow-up visits as told by your health care provider. This is important. Contact a health care   provider if:  You have a fever.  Your condition gets worse or you develop new symptoms.  Your family or friends notice any behavioral changes.  Your nausea or vomiting gets worse.  You have numbness or a "pins and needles" sensation. Get help right away if:  You have difficulty speaking or moving.  You are  always dizzy.  You faint.  You develop severe headaches.  You have weakness in your legs or arms.  You have changes in your hearing or vision.  You develop a stiff neck.  You develop sensitivity to light. This information is not intended to replace advice given to you by your health care provider. Make sure you discuss any questions you have with your health care provider. Document Released: 05/14/2006 Document Revised: 01/12/2016 Document Reviewed: 11/29/2014 Elsevier Interactive Patient Education  2018 Elsevier Inc.   How to Perform the Epley Maneuver The Epley maneuver is an exercise that relieves symptoms of vertigo. Vertigo is the feeling that you or your surroundings are moving when they are not. When you feel vertigo, you may feel like the room is spinning and have trouble walking. Dizziness is a little different than vertigo. When you are dizzy, you may feel unsteady or light-headed. You can do this maneuver at home whenever you have symptoms of vertigo. You can do it up to 3 times a day until your symptoms go away. Even though the Epley maneuver may relieve your vertigo for a few weeks, it is possible that your symptoms will return. This maneuver relieves vertigo, but it does not relieve dizziness. What are the risks? If it is done correctly, the Epley maneuver is considered safe. Sometimes it can lead to dizziness or nausea that goes away after a short time. If you develop other symptoms, such as changes in vision, weakness, or numbness, stop doing the maneuver and call your health care provider. How to perform the Epley maneuver 1. Sit on the edge of a bed or table with your back straight and your legs extended or hanging over the edge of the bed or table. 2. Turn your head halfway toward the affected ear or side. 3. Lie backward quickly with your head turned until you are lying flat on your back. You may want to position a pillow under your shoulders. 4. Hold this position  for 30 seconds. You may experience an attack of vertigo. This is normal. 5. Turn your head to the opposite direction until your unaffected ear is facing the floor. 6. Hold this position for 30 seconds. You may experience an attack of vertigo. This is normal. Hold this position until the vertigo stops. 7. Turn your whole body to the same side as your head. Hold for another 30 seconds. 8. Sit back up. You can repeat this exercise up to 3 times a day. Follow these instructions at home:  After doing the Epley maneuver, you can return to your normal activities.  Ask your health care provider if there is anything you should do at home to prevent vertigo. He or she may recommend that you: ? Keep your head raised (elevated) with two or more pillows while you sleep. ? Do not sleep on the side of your affected ear. ? Get up slowly from bed. ? Avoid sudden movements during the day. ? Avoid extreme head movement, like looking up or bending over. Contact a health care provider if:  Your vertigo gets worse.  You have other symptoms, including: ? Nausea. ? Vomiting. ? Headache.   Get help right away if:  You have vision changes.  You have a severe or worsening headache or neck pain.  You cannot stop vomiting.  You have new numbness or weakness in any part of your body. Summary  Vertigo is the feeling that you or your surroundings are moving when they are not.  The Epley maneuver is an exercise that relieves symptoms of vertigo.  If the Epley maneuver is done correctly, it is considered safe. You can do it up to 3 times a day. This information is not intended to replace advice given to you by your health care provider. Make sure you discuss any questions you have with your health care provider. Document Released: 08/11/2013 Document Revised: 06/26/2016 Document Reviewed: 06/26/2016 Elsevier Interactive Patient Education  2017 Elsevier Inc.  

## 2017-10-25 NOTE — Progress Notes (Signed)
   Sheila Wood is a 83 y.o. female here for an acute visit.  History of Present Illness:   Sheila Wood, CMA acting as scribe for Dr. Briscoe Deutscher.   HPI: Patient states that two days ago she was cleaning the oven and when she bent down she became very dizzy and nauseous. She states that after that she is having same symptoms but not as bad. She has noticed that movements of her head are what causes it to start.     PMHx, SurgHx, SocialHx, Medications, and Allergies were reviewed in the Visit Navigator and updated as appropriate.  Current Medications:   .  PROLENSA 0.07 % SOLN, 1 DROP IN RIGHT EYE DAILY START 1 DAY BEFORE SURGERY USE TILL GONE, Disp: , Rfl: 0   Allergies  Allergen Reactions  . Penicillins Rash  . Aspirin    Review of Systems:   Pertinent items are noted in the HPI. Otherwise, ROS is negative.  Vitals:   Vitals:   10/25/17 1011  BP: 130/74  Pulse: 67  Temp: 98.6 F (37 C)  TempSrc: Oral  SpO2: 97%  Weight: 119 lb 3.2 oz (54.1 kg)     Body mass index is 19.84 kg/m.  Physical Exam:   Physical Exam  Constitutional: She is oriented to person, place, and time. She appears well-developed and well-nourished. No distress.  HENT:  Head: Normocephalic and atraumatic.  Right Ear: External ear normal.  Left Ear: External ear normal.  Nose: Nose normal.  Mouth/Throat: Oropharynx is clear and moist.  Eyes: Conjunctivae and EOM are normal. Pupils are equal, round, and reactive to light.  Neck: Normal range of motion. Neck supple. No thyromegaly present.  Cardiovascular: Normal rate, regular rhythm, normal heart sounds and intact distal pulses.  Pulmonary/Chest: Effort normal.  Abdominal: Soft.  Musculoskeletal: Normal range of motion.  Neurological: She is alert and oriented to person, place, and time. She displays normal reflexes. No cranial nerve deficit or sensory deficit. She exhibits normal muscle tone. Coordination normal.  Skin: Skin is warm.    Psychiatric: She has a normal mood and affect. Her behavior is normal.  Nursing note and vitals reviewed.   Assessment and Plan:   Sheila Wood was seen today for dizziness and nausea.  Diagnoses and all orders for this visit:  Benign paroxysmal vertigo Comments: See AVS.    . Reviewed expectations re: course of current medical issues. . Discussed self-management of symptoms. . Outlined signs and symptoms indicating need for more acute intervention. . Patient verbalized understanding and all questions were answered. Marland Kitchen Health Maintenance issues including appropriate healthy diet, exercise, and smoking avoidance were discussed with patient. . See orders for this visit as documented in the electronic medical record. . Patient received an After Visit Summary.  CMA served as Education administrator during this visit. History, Physical, and Plan performed by medical provider. The above documentation has been reviewed and is accurate and complete. Briscoe Deutscher, D.O.  Briscoe Deutscher, DO [place], [facility] 10/25/2017

## 2017-12-03 DIAGNOSIS — H2511 Age-related nuclear cataract, right eye: Secondary | ICD-10-CM | POA: Diagnosis not present

## 2017-12-03 DIAGNOSIS — H25811 Combined forms of age-related cataract, right eye: Secondary | ICD-10-CM | POA: Diagnosis not present

## 2018-03-31 ENCOUNTER — Encounter: Payer: Self-pay | Admitting: Family Medicine

## 2018-03-31 ENCOUNTER — Ambulatory Visit: Payer: Medicare Other | Admitting: Family Medicine

## 2018-03-31 ENCOUNTER — Ambulatory Visit (INDEPENDENT_AMBULATORY_CARE_PROVIDER_SITE_OTHER): Payer: Medicare Other

## 2018-03-31 VITALS — BP 122/70 | HR 60 | Temp 98.0°F | Ht 65.0 in | Wt 119.8 lb

## 2018-03-31 DIAGNOSIS — M25511 Pain in right shoulder: Secondary | ICD-10-CM

## 2018-03-31 NOTE — Patient Instructions (Signed)
Take 600mg  of ibuprofen three times a day with food as needed for pain. Ice/heat..  Do exercises if doesn't get better will need MRI or physical therapy

## 2018-03-31 NOTE — Progress Notes (Signed)
Patient: Sheila Wood MRN: [id number] DOB: 25-Apr-1935 PCP: Marin Olp, MD     Subjective:  Chief Complaint  Patient presents with  . right arm pain    x 1 wk-getting worse    HPI: The patient is a 83 y.o. female who presents today for worsening right arm pain. She states last week her arm started to hurt her, but it wasn't that bad. She states it usually goes away, but today it is really bad and painful. She put some ice on it, but it didn't seem to help. Pain is in her superior aspect of her right humerus, doesn't really go into her shoulder. Pain started after she was doing some yard work. She was picking up sticks, not heavy lifting. Didn't feel anything pop or strain during yard work. No numbness or tingling down the arm. She does feel like her arm is weaker. Hand grip is intact. She is left handed, but writes right handed and does everything with her right hand. She is ambidextrous. Upward movement makes the pain worse. Pain described as sharp and intermittent. She hasn't really taking any medication until yesterday and it didn't do much at all. Has pain at night if she lays on her shoulder.   Review of Systems  Respiratory: Negative for shortness of breath.   Cardiovascular: Negative for chest pain.  Musculoskeletal: Positive for myalgias. Negative for back pain and neck pain.       Right upper arm/shoulder pain  Neurological: Positive for weakness. Negative for dizziness, numbness and headaches.    Allergies Patient is allergic to penicillins and aspirin.  Past Medical History Patient  has a past medical history of History of colonic polyps, Hyperlipidemia, Internal hemorrhoid, and Osteoporosis.  Surgical History Patient  has a past surgical history that includes Right colectomy; Cholecystectomy; Esophagogastroduodenoscopy; and Colonoscopy.  Family History Pateint's family history includes Colon cancer in her brother; Diabetes in her sister; Heart disease in her mother;  Lung cancer in her father.  Social History Patient  reports that she has never smoked. She has never used smokeless tobacco. She reports that she drinks about 2.0 standard drinks of alcohol per week. She reports that she does not use drugs.    Objective: Vitals:   03/31/18 1422  BP: 122/70  Pulse: 60  Temp: 98 F (36.7 C)  TempSrc: Oral  SpO2: 97%  Weight: 119 lb 12.8 oz (54.3 kg)  Height: 5\' 5"  (1.651 m)    Body mass index is 19.94 kg/m.  Physical Exam  Constitutional: She appears well-developed and well-nourished.  Musculoskeletal:  Negative gerber, +neers test, negative HK  Vitals reviewed.  Shoulder xray: no acute findings.     Assessment/plan: 1. Acute pain of right shoulder Impingement vs. Rotator cuff issue. Exercises, NSAID at home for now. If not better will need PT vs. MRI. She will let us know. Allergy to aspirin, but is fine taking ibuprofen.  - DG Shoulder Right; Future      Return if symptoms worsen or fail to improve.     Orma Flaming, MD [place]   03/31/2018

## 2018-04-22 DIAGNOSIS — H04123 Dry eye syndrome of bilateral lacrimal glands: Secondary | ICD-10-CM | POA: Diagnosis not present

## 2018-04-22 DIAGNOSIS — H401111 Primary open-angle glaucoma, right eye, mild stage: Secondary | ICD-10-CM | POA: Diagnosis not present

## 2018-04-22 DIAGNOSIS — H401122 Primary open-angle glaucoma, left eye, moderate stage: Secondary | ICD-10-CM | POA: Diagnosis not present

## 2018-04-22 DIAGNOSIS — H10503 Unspecified blepharoconjunctivitis, bilateral: Secondary | ICD-10-CM | POA: Diagnosis not present

## 2018-04-29 DIAGNOSIS — H401111 Primary open-angle glaucoma, right eye, mild stage: Secondary | ICD-10-CM | POA: Diagnosis not present

## 2018-04-29 DIAGNOSIS — H01001 Unspecified blepharitis right upper eyelid: Secondary | ICD-10-CM | POA: Diagnosis not present

## 2018-04-29 DIAGNOSIS — H10503 Unspecified blepharoconjunctivitis, bilateral: Secondary | ICD-10-CM | POA: Diagnosis not present

## 2018-04-29 DIAGNOSIS — H401122 Primary open-angle glaucoma, left eye, moderate stage: Secondary | ICD-10-CM | POA: Diagnosis not present

## 2018-06-17 DIAGNOSIS — H401122 Primary open-angle glaucoma, left eye, moderate stage: Secondary | ICD-10-CM | POA: Diagnosis not present

## 2018-06-17 DIAGNOSIS — H10503 Unspecified blepharoconjunctivitis, bilateral: Secondary | ICD-10-CM | POA: Diagnosis not present

## 2018-06-17 DIAGNOSIS — H401111 Primary open-angle glaucoma, right eye, mild stage: Secondary | ICD-10-CM | POA: Diagnosis not present

## 2018-06-17 DIAGNOSIS — H01001 Unspecified blepharitis right upper eyelid: Secondary | ICD-10-CM | POA: Diagnosis not present

## 2018-09-23 DIAGNOSIS — H53452 Other localized visual field defect, left eye: Secondary | ICD-10-CM | POA: Diagnosis not present

## 2018-09-23 DIAGNOSIS — H401122 Primary open-angle glaucoma, left eye, moderate stage: Secondary | ICD-10-CM | POA: Diagnosis not present

## 2018-09-23 DIAGNOSIS — H10503 Unspecified blepharoconjunctivitis, bilateral: Secondary | ICD-10-CM | POA: Diagnosis not present

## 2018-09-23 DIAGNOSIS — H401111 Primary open-angle glaucoma, right eye, mild stage: Secondary | ICD-10-CM | POA: Diagnosis not present

## 2018-09-26 ENCOUNTER — Ambulatory Visit (INDEPENDENT_AMBULATORY_CARE_PROVIDER_SITE_OTHER): Payer: Medicare Other

## 2018-09-26 VITALS — BP 112/72 | HR 70 | Ht 65.0 in | Wt 118.2 lb

## 2018-09-26 DIAGNOSIS — Z0000 Encounter for general adult medical examination without abnormal findings: Secondary | ICD-10-CM | POA: Diagnosis not present

## 2018-09-26 NOTE — Progress Notes (Signed)
PCP notes: Last OV 03/31/2018 (with Dr. Rogers Blocker)   Health maintenance:Flu-declined. HM updated   Abnormal screenings: MMSE score of 30   Patient concerns: None   Nurse concerns:None   Next PCP appt: Schedule as Needed -scheduled for 10/09/2018 as patient has not seen Dr. Yong Channel since 2018 and would like ot have blood work done to check cholesterol and any other needed labs

## 2018-09-26 NOTE — Progress Notes (Signed)
Subjective:   Sheila Wood is a 84 y.o. female who presents for Medicare Annual (Subsequent) preventive examination.  Review of Systems:  No ROS.  Medicare Wellness Visit. Additional risk factors are reflected in the social history. Cardiac Risk Factors include: advanced age (>22men, >35 women) Patient lives in a single story home with her son who is in Dialysis 3 days a week. Patient enjoys jig saw puzzles, walking in the park, spending time on the computer. Member at [employer].   Patient goes to bed between 10-11pm. Patient get sup about 1 time a night to go to the bathroom. No CPAP. Gets up around 6:30am. Feels rested sometimes when she wakes up.    Objective:     Vitals: BP 112/72 (BP Location: Left Arm, Patient Position: Sitting, Cuff Size: Large)   Pulse 70   Ht 5\' 5"  (1.651 m)   Wt 118 lb 3.2 oz (53.6 kg)   LMP  (LMP Unknown)   SpO2 95%   BMI 19.67 kg/m   Body mass index is 19.67 kg/m.  Advanced Directives 09/26/2018 03/27/2017  Does Patient Have a Medical Advance Directive? No Yes  Would patient like information on creating a medical advance directive? No - Patient declined -    Tobacco Social History   Tobacco Use  Smoking Status Never Smoker  Smokeless Tobacco Never Used     Counseling given: Not Answered      Past Medical History:  Diagnosis Date  . History of colonic polyps   . Hyperlipidemia   . Internal hemorrhoid   . Osteoporosis    Past Surgical History:  Procedure Laterality Date  . CHOLECYSTECTOMY     at time of colectomy  . COLONOSCOPY    . ESOPHAGOGASTRODUODENOSCOPY    . RIGHT COLECTOMY     adenomatous polyp   Family History  Problem Relation Age of Onset  . Colon cancer Brother   . Heart disease Mother        unclear history  . Diabetes Sister   . Lung cancer Father   . Kidney disease Son   . Prostate cancer Brother    Social History   Socioeconomic History  . Marital status: Married    Spouse name: Not on file  .  Number of children: Not on file  . Years of education: Not on file  . Highest education level: Not on file  Occupational History  . Not on file  Social Needs  . Financial resource strain: Not on file  . Food insecurity:    Worry: Not on file    Inability: Not on file  . Transportation needs:    Medical: Not on file    Non-medical: Not on file  Tobacco Use  . Smoking status: Never Smoker  . Smokeless tobacco: Never Used  Substance and Sexual Activity  . Alcohol use: Yes    Alcohol/week: 2.0 standard drinks    Types: 2 Standard drinks or equivalent per week    Comment: glass of wine- rarely uses  . Drug use: No  . Sexual activity: Not Currently  Lifestyle  . Physical activity:    Days per week: Not on file    Minutes per session: Not on file  . Stress: Not on file  Relationships  . Social connections:    Talks on phone: Not on file    Gets together: Not on file    Attends religious service: Not on file    Active member of club or organization:  Not on file    Attends meetings of clubs or organizations: Not on file    Relationship status: Not on file  Other Topics Concern  . Not on file  Social History Narrative   Widowed in Oct 2014. Lives alone. 3 children (daughter, 2 sons) all live close-sees regularly. 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 TRAVATAN Z 0.004 % SOLN ophthalmic solution Place 1 drop into the left eye daily.  . [DISCONTINUED] PROLENSA 0.07 % SOLN 1 DROP IN RIGHT EYE DAILY START 1 DAY BEFORE SURGERY USE TILL GONE   No facility-administered encounter medications on file as of 09/26/2018.     Activities of Daily Living In your present state of health, do you have  any difficulty performing the following activities: 09/26/2018  Hearing? N  Vision? N  Difficulty concentrating or making decisions? N  Walking or climbing stairs? N  Dressing or bathing? N  Doing errands, shopping? N  Preparing Food and eating ? N  Using the Toilet? N  In the past six months, have you accidently leaked urine? N  Do you have problems with loss of bowel control? N  Managing your Medications? N  Managing your Finances? N  Housekeeping or managing your Housekeeping? N  Some recent data might be hidden    Patient Care Team: Marin Olp, MD as PCP - General (Family Medicine)    Assessment:   This is a routine wellness examination for [place].  Exercise Activities and Dietary recommendations Current Exercise Habits: The patient does not participate in regular exercise at present, Exercise limited by: None identified  Breakfast: English Muffin with butter and jelly, Coffee with a little milk  Lunch: Left overs, sandwich, drinks cup of coffee or water  Dinner: water to drink, roast beef, potatoes, green beans  Not really a snacker, occasional apple Goals    . patient      Go on vacation!         Fall Risk Fall Risk  09/26/2018 03/27/2017 12/29/2015 09/07/2014  Falls in the past year? 0 No No No    Depression Screen PHQ 2/9 Scores 09/26/2018 03/27/2017 12/29/2015 09/07/2014  PHQ - 2 Score 0 0 0 0     Cognitive Function MMSE - Mini Mental State Exam 09/26/2018  Orientation to time 5  Orientation to Place 5  Registration 3  Attention/ Calculation 5  Recall 3  Language- name 2 objects 2  Language- repeat 1  Language- follow 3 step command 3  Language- read & follow direction 1  Write a sentence 1  Copy design 1  Total score 30        Immunization History  Administered Date(s) Administered  . Influenza Whole 06/05/2010  . Influenza, High Dose Seasonal PF 07/01/2017      Screening Tests Health Maintenance  Topic Date Due  . INFLUENZA VACCINE   11/18/2018 (Originally 03/20/2018)  . DEXA SCAN  09/07/2024 (Originally 08/23/2001)  . TETANUS/TDAP  09/07/2024 (Originally 08/24/1953)  . PNA vac Low Risk Adult (1 of 2 - PCV13) 03/24/2029 (Originally 08/23/2001)         Plan:   Follow Up with PCP as Advised   I  have personally reviewed and noted the following in the patient's chart:   . Medical and social history . Use of alcohol, tobacco or illicit drugs  . Current medications and supplements . Functional ability and status . Nutritional status . Physical activity . Advanced directives . List of other physicians . Vitals . Screenings to include cognitive, depression, and falls . Referrals and appointments  In addition, I have reviewed and discussed with patient certain preventive protocols, quality metrics, and best practice recommendations. A written personalized care plan for preventive services as well as general preventive health recommendations were provided to patient.     [place], [place]  0/01/3015

## 2018-09-26 NOTE — Patient Instructions (Signed)
Ms. Burandt , Thank you for taking time to come for your Medicare Wellness Visit. I appreciate your ongoing commitment to your health goals. Please review the following plan we discussed and let me know if I can assist you in the future.   These are the goals we discussed: Goals    . patient      Go on vacation!         This is a list of the screening recommended for you and due dates:  Health Maintenance  Topic Date Due  . Flu Shot  11/18/2018*  . DEXA scan (bone density measurement)  09/07/2024*  . Tetanus Vaccine  09/07/2024*  . Pneumonia vaccines (1 of 2 - PCV13) 03/24/2029*  *Topic was postponed. The date shown is not the original due date.   Preventive Care for Adults  A healthy lifestyle and preventive care can promote health and wellness. Preventive health guidelines for adults include the following key practices.  . A routine yearly physical is a good way to check with your health care provider about your health and preventive screening. It is a chance to share any concerns and updates on your health and to receive a thorough exam.  . Visit your dentist for a routine exam and preventive care every 6 months. Brush your teeth twice a day and floss once a day. Good oral hygiene prevents tooth decay and gum disease.  . The frequency of eye exams is based on your age, health, family medical history, use  of contact lenses, and other factors. Follow your health care provider's recommendations for frequency of eye exams.  . Eat a healthy diet. Foods like vegetables, fruits, whole grains, low-fat dairy products, and lean protein foods contain the nutrients you need without too many calories. Decrease your intake of foods high in solid fats, added sugars, and salt. Eat the right amount of calories for you. Get information about a proper diet from your health care provider, if necessary.  . Regular physical exercise is one of the most important things you can do for your health. Most  adults should get at least 150 minutes of moderate-intensity exercise (any activity that increases your heart rate and causes you to sweat) each week. In addition, most adults need muscle-strengthening exercises on 2 or more days a week.  Silver Sneakers may be a benefit available to you. To determine eligibility, you may visit the website: www.silversneakers.com or contact program at 502-752-2833 Mon-Fri between 8AM-8PM.   . Maintain a healthy weight. The body mass index (BMI) is a screening tool to identify possible weight problems. It provides an estimate of body fat based on height and weight. Your health care provider can find your BMI and can help you achieve or maintain a healthy weight.   For adults 20 years and older: ? A BMI below 18.5 is considered underweight. ? A BMI of 18.5 to 24.9 is normal. ? A BMI of 25 to 29.9 is considered overweight. ? A BMI of 30 and above is considered obese.   . Maintain normal blood lipids and cholesterol levels by exercising and minimizing your intake of saturated fat. Eat a balanced diet with plenty of fruit and vegetables. Blood tests for lipids and cholesterol should begin at age 69 and be repeated every 5 years. If your lipid or cholesterol levels are high, you are over 50, or you are at high risk for heart disease, you may need your cholesterol levels checked more frequently. Ongoing high lipid and  cholesterol levels should be treated with medicines if diet and exercise are not working.  . If you smoke, find out from your health care provider how to quit. If you do not use tobacco, please do not start.  . If you choose to drink alcohol, please do not consume more than 2 drinks per day. One drink is considered to be 12 ounces (355 mL) of beer, 5 ounces (148 mL) of wine, or 1.5 ounces (44 mL) of liquor.  . If you are 87-2 years old, ask your health care provider if you should take aspirin to prevent strokes.  . Use sunscreen. Apply sunscreen  liberally and repeatedly throughout the day. You should seek shade when your shadow is shorter than you. Protect yourself by wearing long sleeves, pants, a wide-brimmed hat, and sunglasses year round, whenever you are outdoors.  . Once a month, do a whole body skin exam, using a mirror to look at the skin on your back. Tell your health care provider of new moles, moles that have irregular borders, moles that are larger than a pencil eraser, or moles that have changed in shape or color.

## 2018-09-26 NOTE — Progress Notes (Signed)
I have reviewed and agree with note, evaluation, plan.   Glad she was scheduled for visit with me  so we can check in together.   Garret Reddish, MD

## 2018-10-09 ENCOUNTER — Ambulatory Visit (INDEPENDENT_AMBULATORY_CARE_PROVIDER_SITE_OTHER): Payer: Medicare Other | Admitting: Family Medicine

## 2018-10-09 ENCOUNTER — Encounter: Payer: Self-pay | Admitting: Family Medicine

## 2018-10-09 VITALS — BP 122/76 | HR 50 | Temp 98.0°F | Ht 64.25 in | Wt 119.2 lb

## 2018-10-09 DIAGNOSIS — Z0000 Encounter for general adult medical examination without abnormal findings: Secondary | ICD-10-CM

## 2018-10-09 DIAGNOSIS — E785 Hyperlipidemia, unspecified: Secondary | ICD-10-CM | POA: Diagnosis not present

## 2018-10-09 LAB — POC URINALSYSI DIPSTICK (AUTOMATED)
Bilirubin, UA: NEGATIVE
Blood, UA: NEGATIVE
Glucose, UA: NEGATIVE
KETONES UA: NEGATIVE
Leukocytes, UA: NEGATIVE
Nitrite, UA: NEGATIVE
PROTEIN UA: NEGATIVE
SPEC GRAV UA: 1.025 (ref 1.010–1.025)
Urobilinogen, UA: 0.2 E.U./dL
pH, UA: 5.5 (ref 5.0–8.0)

## 2018-10-09 LAB — COMPREHENSIVE METABOLIC PANEL
ALT: 11 U/L (ref 0–35)
AST: 16 U/L (ref 0–37)
Albumin: 4.4 g/dL (ref 3.5–5.2)
Alkaline Phosphatase: 103 U/L (ref 39–117)
BUN: 17 mg/dL (ref 6–23)
CHLORIDE: 102 meq/L (ref 96–112)
CO2: 29 mEq/L (ref 19–32)
Calcium: 9.7 mg/dL (ref 8.4–10.5)
Creatinine, Ser: 0.75 mg/dL (ref 0.40–1.20)
GFR: 73.96 mL/min (ref >60.00)
GLUCOSE: 80 mg/dL (ref 70–99)
POTASSIUM: 5.3 meq/L — AB (ref 3.5–5.1)
SODIUM: 139 meq/L (ref 135–145)
Total Bilirubin: 0.4 mg/dL (ref 0.2–1.2)
Total Protein: 6.8 g/dL (ref 6.0–8.3)

## 2018-10-09 LAB — CBC
HEMATOCRIT: 42.2 % (ref 36.0–46.0)
Hemoglobin: 14 g/dL (ref 12.0–15.0)
MCHC: 33.2 g/dL (ref 30.0–36.0)
MCV: 92.4 fl (ref 78.0–100.0)
Platelets: 239 10*3/uL (ref 150.0–400.0)
RBC: 4.56 Mil/uL (ref 3.87–5.11)
RDW: 13.3 % (ref 11.5–15.5)
WBC: 5.1 10*3/uL (ref 4.0–10.5)

## 2018-10-09 LAB — LIPID PANEL
CHOLESTEROL: 226 mg/dL — AB (ref 0–200)
HDL: 82.9 mg/dL (ref >39.00)
LDL CALC: 109 mg/dL — AB (ref 0–99)
NONHDL: 143.49
Total CHOL/HDL Ratio: 3
Triglycerides: 174 mg/dL — ABNORMAL HIGH (ref 0.0–149.0)
VLDL: 34.8 mg/dL (ref 0.0–40.0)

## 2018-10-09 NOTE — Addendum Note (Signed)
Addended by: Francis Dowse T on: 10/09/2018 11:57 AM   Modules accepted: Orders

## 2018-10-09 NOTE — Patient Instructions (Addendum)
No changes today. You declined colonscopy, flu, pneumonia shot, bone density - let me know if you change your mind.   Please stop by lab before you go If you do not have mychart- we will call you about results within 5 business days of [place] receiving them.  If you have mychart- we will send your results within 3 business days of [place] receiving them.  If abnormal or we want to clarify a result, we will call or mychart you to make sure you receive the message.  If you have questions or concerns or don't hear within 5-7 days, please send [place] a message or call us.

## 2018-10-09 NOTE — Progress Notes (Signed)
Phone: [phone]   Subjective:  Patient presents today for their annual physical. Chief complaint-noted.   See problem oriented charting- ROS- full  review of systems was completed and negative including No chest pain or shortness of breath. No headache or blurry vision.   The following were reviewed and entered/updated in epic: Past Medical History:  Diagnosis Date  . History of colonic polyps   . Hyperlipidemia   . Internal hemorrhoid   . Osteoporosis    Patient Active Problem List   Diagnosis Date Noted  . Hyperlipidemia 05/05/2007    Priority: Medium  . Osteoporosis 05/05/2007    Priority: Medium  . Glaucoma 03/12/2016    Priority: Low  . History of colonic polyps 05/05/2007    Priority: Low   Past Surgical History:  Procedure Laterality Date  . CHOLECYSTECTOMY     at time of colectomy  . COLONOSCOPY    . ESOPHAGOGASTRODUODENOSCOPY    . RIGHT COLECTOMY     adenomatous polyp    Family History  Problem Relation Age of Onset  . Colon cancer Brother   . Heart disease Mother        unclear history  . Diabetes Sister   . Lung cancer Father   . Kidney disease Son   . Prostate cancer Brother     Medications- reviewed and updated Current Outpatient Medications  Medication Sig Dispense Refill  . TRAVATAN Z 0.004 % SOLN ophthalmic solution Place 1 drop into the left eye daily.     No current facility-administered medications for this visit.     Allergies-reviewed and updated Allergies  Allergen Reactions  . Penicillins Rash  . Aspirin     Social History   Social History Narrative   Widowed in Oct 2014. Lives alone. 3 children (daughter, 2 sons) all live close-sees regularly. 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 Oropharynx normal Neck: no thyromegaly CV: RRR no murmurs rubs or gallops Lungs: CTAB no crackles, wheeze, rhonchi Abdomen: soft/nontender/nondistended/normal bowel sounds. No rebound or guarding.  Ext: no edema Skin: warm, dry Neuro: grossly normal, moves all extremities, PERRLA   Assessment and Plan    84 y.o. female presenting for annual physical.  Health Maintenance counseling: 1. Anticipatory guidance: Patient counseled regarding regular dental exams -q6 months, eye exams - yearly with Dr. Bing Plume,  avoiding smoking and second hand smoke , limiting alcohol to 1 beverage per day- sparing wine or pizza socially .   2. Risk factor reduction:  Advised patient of need for regular exercise (stays active but no intentional regular exercise other than tries to walk with nice weather)  and diet rich and fruits and vegetables to reduce risk of heart attack and stroke.  Fortunately weight stable over the last year-have discussed actually gaining weight if possible.  Discussed using boost/Ensure products Wt Readings from Last 3 Encounters:  10/09/18 119 lb 3.2 oz (54.1 kg)  09/26/18 118 lb  3.2 oz (53.6 kg)  03/31/18 119 lb 12.8 oz (54.3 kg)  3. Immunizations/screenings/ancillary studies-declines flu and pneumonia shot and shingles shot  Immunization History  Administered Date(s) Administered  . Influenza Whole 06/05/2010  . Influenza, High Dose Seasonal PF 07/01/2017  4. Cervical cancer screening-passed age based screening.  Denies GU concerns  5. Breast cancer screening-past age based screening, does self exams. She declines mammogram.   6. Colon cancer screening -patient with history of adenomatous polyp.  Last checked in 2015-was advised to repeat.   She has declined.  She had stool cards in 2017 which were normal.  She agreed to colonoscopy last year but did not follow through. She declines follow up despite colon cancer risk with prior polyp.  7. Skin cancer screening-see skin surgery center yearly in past- about 2 years ago at this point- now only going as needed per their recommendatoins.  Advised regular sunscreen use. Denies worrisome, changing, or new skin lesions.  8.. Osteoporosis screening at 65- declines bone density.  She states she would decline medication regardless.  Continue calcium and vitamin D. -9.  Never smoker  Status of chronic or acute concerns   Osteoporosis-see above  Hyperlipidemia- no clear indication for primary prevention given her age-update lipids today for her preference  Adenomatous colon polyp history- strongly declines follow up despite risks. Agrees to reconsider if notes blood in stool or anemic.   Future Appointments  Date Time Provider [place]  10/14/2019  1:00 PM [facility]   Lab/Order associations:english muffin, banana, and coffee . Denies urinary issues other than incontinence if holds for long period Preventative health care - Plan: CBC, Comprehensive metabolic panel, Lipid panel, POCT Urinalysis Dipstick (Automated)  Hyperlipidemia, unspecified hyperlipidemia type - Plan: CBC, Comprehensive metabolic panel, Lipid panel, POCT Urinalysis Dipstick (Automated)  Return precautions advised.  Garret Reddish, MD

## 2018-10-13 ENCOUNTER — Telehealth: Payer: Self-pay | Admitting: Family Medicine

## 2018-10-13 NOTE — Telephone Encounter (Signed)
Labs has been released. Pt notified!

## 2018-10-13 NOTE — Telephone Encounter (Signed)
Copied from [place] [id number]. Topic: General - Other >> Oct 13, 2018  3:41 PM Sheran Luz wrote: Reason for CRM: Patient called to request labs from 10/09/2018 be released to mychart.

## 2018-10-13 NOTE — Telephone Encounter (Signed)
See note

## 2018-11-04 ENCOUNTER — Other Ambulatory Visit: Payer: Self-pay

## 2018-11-04 ENCOUNTER — Ambulatory Visit (INDEPENDENT_AMBULATORY_CARE_PROVIDER_SITE_OTHER): Payer: Medicare Other | Admitting: Family Medicine

## 2018-11-04 ENCOUNTER — Encounter: Payer: Self-pay | Admitting: Family Medicine

## 2018-11-04 VITALS — BP 110/68 | HR 67 | Temp 98.2°F | Ht 64.25 in | Wt 119.0 lb

## 2018-11-04 DIAGNOSIS — B029 Zoster without complications: Secondary | ICD-10-CM

## 2018-11-04 MED ORDER — VALACYCLOVIR HCL 1 G PO TABS: 1000.0000 mg | ORAL_TABLET | Freq: Three times a day (TID) | ORAL | 0 refills | Status: DC

## 2018-11-04 NOTE — Patient Instructions (Signed)
Start your first valtrex as soon as you get it - can probably go ahead and take another right before bed. Try to have a little something at least on your stomach  If you change your mind on pain medicine please let me know- can contact through mychart. Tylenol or ibuprofen/aleve is fine to trial for it at home. TOo much aleve can be tough on kidneys and can cause stomach bleeds- can alternate these as well    Shingles  Shingles is an infection. It gives you a painful skin rash and blisters that have fluid in them. Shingles is caused by the same germ (virus) that causes chickenpox. Shingles only happens in people who:  Have had chickenpox.  Have been given a shot of medicine (vaccine) to protect against chickenpox. Shingles is rare in this group. The first symptoms of shingles may be itching, tingling, or pain in an area on your skin. A rash will show on your skin a few days or weeks later. The rash is likely to be on one side of your body. The rash usually has a shape like a belt or a band. Over time, the rash turns into fluid-filled blisters. The blisters will break open, change into scabs, and dry up. Medicines may:  Help with pain and itching.  Help you get better sooner.  Help to prevent long-term problems. Follow these instructions at home: Medicines  Take over-the-counter and prescription medicines only as told by your doctor.  Put on an anti-itch cream or numbing cream where you have a rash, blisters, or scabs. Do this as told by your doctor. Helping with itching and discomfort   Put cold, wet cloths (cold compresses) on the area of the rash or blisters as told by your doctor.  Cool baths can help you feel better. Try adding baking soda or dry oatmeal to the water to lessen itching. Do not bathe in hot water. Blister and rash care  Keep your rash covered with a loose bandage (dressing).  Wear loose clothing that does not rub on your rash.  Keep your rash and blisters  clean. To do this, wash the area with mild soap and cool water as told by your doctor.  Check your rash every day for signs of infection. Check for: ? More redness, swelling, or pain. ? Fluid or blood. ? Warmth. ? Pus or a bad smell.  Do not scratch your rash. Do not pick at your blisters. To help you to not scratch: ? Keep your fingernails clean and cut short. ? Wear gloves or mittens when you sleep, if scratching is a problem. General instructions  Rest as told by your doctor.  Keep all follow-up visits as told by your doctor. This is important.  Wash your hands often with soap and water. If soap and water are not available, use hand sanitizer. Doing this lowers your chance of getting a skin infection caused by germs (bacteria).  Your infection can cause chickenpox in people who have never had chickenpox or never got a shot of chickenpox vaccine. If you have blisters that did not change into scabs yet, try not to touch other people or be around other people, especially: ? Babies. ? Pregnant women. ? Children who have areas of red, itchy, or rough skin (eczema). ? Very old people who have transplants. ? People who have a long-term (chronic) sickness, like cancer or AIDS. Contact a doctor if:  Your pain does not get better with medicine.  Your pain does  not get better after the rash heals.  You have any signs of infection in the rash area. These signs include: ? More redness, swelling, or pain around the rash. ? Fluid or blood coming from the rash. ? The rash area feeling warm to the touch. ? Pus or a bad smell coming from the rash. Get help right away if:  The rash is on your face or nose.  You have pain in your face or pain by your eye.  You lose feeling on one side of your face.  You have trouble seeing.  You have ear pain, or you have ringing in your ear.  You have a loss of taste.  Your condition gets worse. Summary  Shingles gives you a painful skin rash and  blisters that have fluid in them.  Shingles is an infection. It is caused by the same germ (virus) that causes chickenpox.  Keep your rash covered with a loose bandage (dressing). Wear loose clothing that does not rub on your rash.  If you have blisters that did not change into scabs yet, try not to touch other people or be around people. This information is not intended to replace advice given to you by your health care provider. Make sure you discuss any questions you have with your health care provider. Document Released: 01/23/2008 Document Revised: 04/10/2017 Document Reviewed: 04/10/2017 Elsevier Interactive Patient Education  2019 [employer].

## 2018-11-04 NOTE — Progress Notes (Signed)
  Phone [phone]   Subjective:  Sheila Wood is a 84 y.o. year old very pleasant female patient who presents for/with See problem oriented charting  Past Medical History-  Patient Active Problem List   Diagnosis Date Noted  . Hyperlipidemia 05/05/2007    Priority: Medium  . Osteoporosis 05/05/2007    Priority: Medium  . Glaucoma 03/12/2016    Priority: Low  . History of colonic polyps 05/05/2007    Priority: Low    Medications- reviewed and updated Current Outpatient Medications  Medication Sig Dispense Refill  . TRAVATAN Z 0.004 % SOLN ophthalmic solution Place 1 drop into the left eye daily.     No current facility-administered medications for this visit.      Objective:  BP 110/68 (BP Location: Left Arm, Patient Position: Sitting, Cuff Size: Normal)   Pulse 67   Temp 98.2 F (36.8 C) (Oral)   Ht 5' 4.25" (1.632 m)   Wt 119 lb (54 kg)   LMP  (LMP Unknown)   SpO2 99%   BMI 20.27 kg/m  Gen: NAD, resting comfortably CV: RRR no murmurs rubs or gallops Lungs: CTAB no crackles, wheeze, rhonchi Abdomen: soft/nontender/nondistended/normal bowel sounds. No rebound or guarding.  Ext: no edema Skin: warm, dry, multiple papules and vesicles over right neck and shoulder-does not appear to be outside of C4-C6 nerve distribution.  Does not cross the midline. Neuro: Normal gait and speech    Assessment and Plan   #Rash-right neck and shoulder S:Noted rash 2 days ago on right neck and shoulder. Pain started today.  Like 7/10 toothache pain. No shingles immunization. Pain is worsening. Took an aleve today and didn't help much. Able to sleep last night - pain is not stopping hre.  Denies recent irritating contacts such as poison ivy exposure ROS-not ill appearing, no fever/chills. No new medications. Not immunocompromised. No mucus membrane involvement.  A/P: Rash on right neck and shoulder-potentially in C4-C6 distribution-in patient who is not immunized against shingles.  High  probability of shingles-we will treat with Valtrex.  She declines medication such as gabapentin or stronger pain medicines.  She wants to try Tylenol and Aleve over-the-counter  Future Appointments  Date Time Provider [place]  10/14/2019  1:00 PM [facility]   Meds ordered this encounter  Medications  . valACYclovir (VALTREX) 1000 MG tablet    Sig: Take 1 tablet (1,000 mg total) by mouth 3 (three) times daily. For 7 days for shingles.    Dispense:  21 tablet    Refill:  0    Return precautions advised.  Garret Reddish, MD

## 2018-11-05 ENCOUNTER — Ambulatory Visit: Payer: Self-pay | Admitting: *Deleted

## 2018-11-05 MED ORDER — GABAPENTIN 100 MG PO CAPS: 100.0000 mg | ORAL_CAPSULE | Freq: Three times a day (TID) | ORAL | 3 refills | Status: DC

## 2018-11-05 NOTE — Addendum Note (Signed)
Addended by: Gwenyth Ober R on: 11/05/2018 05:53 PM   Modules accepted: Orders

## 2018-11-05 NOTE — Telephone Encounter (Signed)
Pt was unavailable. Rx sent to pharmacy will call pt tomorrow to see if she received Rx. Spoke to someone not on pt DPR and was advised that pt emergency contact was deceased. Name removed from emergency contact.

## 2018-11-05 NOTE — Telephone Encounter (Signed)
Saw Dr. Yong Channel, yesterday and dx with Shingles. She is now requesting medication for the nerve pain at this time. Pharmacy on file. R[facility] to PCP.  [facility] [address]  Reason for Disposition . [1] Caller requesting NON-URGENT health information AND [2] PCP's office is the best resource  Answer Assessment - Initial Assessment Questions 1. REASON FOR CALL or QUESTION: "What is your reason for calling today?" or "How can I best help you?" or "What question do you have that I can help answer?"     Pain along the shingles rash.  Protocols used: INFORMATION ONLY CALL-A-AH

## 2018-11-05 NOTE — Telephone Encounter (Signed)
Please send in gabapentin 100 mg 3 times daily #90 with 2 refills.  Have her start with just 1 pill at night.  If she does really well with this-can take another pill in the morning after 3 days-if does really well with that but needs more pain relief and can take a third pill in the daytime

## 2018-11-05 NOTE — Telephone Encounter (Signed)
Forwarding to Dr. Yong Channel regarding request for new rx.

## 2018-11-05 NOTE — Addendum Note (Signed)
Addended by: Gwenyth Ober R on: 11/05/2018 05:55 PM   Modules accepted: Orders

## 2018-11-05 NOTE — Telephone Encounter (Signed)
See note

## 2018-11-06 ENCOUNTER — Other Ambulatory Visit: Payer: Self-pay

## 2018-11-06 ENCOUNTER — Telehealth: Payer: Self-pay | Admitting: Family Medicine

## 2018-11-06 MED ORDER — GABAPENTIN 100 MG PO CAPS: 100.0000 mg | ORAL_CAPSULE | Freq: Three times a day (TID) | ORAL | 3 refills | Status: DC

## 2018-11-06 NOTE — Telephone Encounter (Signed)
I called pharmacy and verbally gave rx  to [facility] in pharmacy.  Pt aware.

## 2018-11-06 NOTE — Telephone Encounter (Signed)
Copied from [place] #[id number]. Topic: Quick Communication - Rx Refill/Question >> Nov 06, 2018  9:15 AM Virl Axe D wrote: Medication: gabapentin (NEURONTIN) 100 MG capsule / Pt stated she spoke with the pharmacy and they do not have rx refill. Requesting it be resent to the pharmacy as soon as possible.  Has the patient contacted their pharmacy? Yes.   (Agent: If no, request that the patient contact the pharmacy for the refill.) (Agent: If yes, when and what did the pharmacy advise?)  Preferred Pharmacy (with phone number or street name): [id number] [place], [place] - [address] [phone] (Phone) [phone] (Fax)    Agent: Please be advised that RX refills may take up to 3 business days. We ask that you follow-up with your pharmacy.

## 2018-11-06 NOTE — Telephone Encounter (Signed)
See note

## 2018-11-06 NOTE — Telephone Encounter (Signed)
Pt stated that the pharmacy received Rx and she is on the way to pick it up. No further action needed.

## 2018-11-12 ENCOUNTER — Ambulatory Visit: Payer: Self-pay

## 2018-11-12 NOTE — Telephone Encounter (Signed)
Call ret'd to pt.  Stated she does not know how long Dr. Yong Channel wants her to take Gabapentin.  Reported she stopped taking it last night, but thinks that was a mistake, because she if feeling increased nerve pain.  Reported she never started / took the Valtrex, because she felt it had "too many scary side effects."  Reported the shingles on back of neck are drying; appearance is dark red rash.  The shingles on front of chest are fluid filled blisters.  Denied itching.  C/o nerve pain.  Reported she will go back on the Gabapentin today, but questioned how long Dr. Yong Channel wants her to take it, and when she does come off of it, should she wean herself?  Advised will send this message to Dr. Yong Channel.  Agreed with plan.       Reason for Disposition . Caller has NON-URGENT medication question about med that PCP prescribed and triager unable to answer question  Answer Assessment - Initial Assessment Questions 1. SYMPTOMS: "Do you have any symptoms?"     Pt. Voiced concern about taking Gabapentin too long.  Denied experiencing any side effects 2. SEVERITY: If symptoms are present, ask "Are they mild, moderate or severe?"     N/a  Protocols used: MEDICATION QUESTION CALL-A-AH  Message from Berneta Levins sent at 11/12/2018 11:21 AM EDT   Summary: medication issue   Pt called and left voicemail on [place] on 11/11/2018 states that she was DX with shingles and given gabapentin (NEURONTIN) 100 MG capsule. Pt states she doesn't like the medication, has been taking it for 1 week and would like to know how to safely come off of it.

## 2018-11-12 NOTE — Telephone Encounter (Signed)
Forwarding to Dr. Hunter.  

## 2018-11-12 NOTE — Telephone Encounter (Signed)
How many is she taking per day right now?   If she is taking 3 a day- have her do that for at least for 2-3 weeks and then can try to reduce by 1 pill every 2-3 weeks. May need medicine for long term (some people have pain after shingles for months, years unfortunatelY)

## 2018-11-13 NOTE — Telephone Encounter (Signed)
Pt is taking the Rx 3 times daily and was advised per Dr. Yong Channel "her do that for at least for 2-3 weeks and then can try to reduce by 1 pill every 2-3 weeks. May need medicine for long term (some people have pain after shingles for months, years unfortunatelY)"

## 2018-11-17 ENCOUNTER — Ambulatory Visit (INDEPENDENT_AMBULATORY_CARE_PROVIDER_SITE_OTHER): Payer: Medicare Other | Admitting: Family Medicine

## 2018-11-17 ENCOUNTER — Encounter: Payer: Self-pay | Admitting: Family Medicine

## 2018-11-17 VITALS — BP 150/80 | HR 67 | Wt 116.0 lb

## 2018-11-17 DIAGNOSIS — B029 Zoster without complications: Secondary | ICD-10-CM | POA: Diagnosis not present

## 2018-11-17 NOTE — Progress Notes (Addendum)
Phone [phone]   Subjective:  Virtual visit via Video note--> phone visit Time spent on the phone-21 minutes.  Counseling needed due to intense discomfort  Our team/I connected with Sheila Wood on 11/17/18 at  1:20 PM EDT by a video enabled telemedicine application (webex-Interactive audio and video telecommunications were attempted between this provider and patient, however failed, due to patient having technical difficulties OR patient did not have access to video capability.  We continued and completed visit with audio in addition to patient being able to see me (but I could not see her)) and verified that I am speaking with the correct person using two identifiers.  Location patient: Home[place] Location provider: [facility], office Persons participating in the virtual visit: patient  Our team/I discussed the limitations of evaluation and management by telemedicine and the availability of in person appointments. In light of current covid-19 pandemic, patient also understands that we are trying to protect them by minimizing in office contact if at all possible.  The patient expressed consent for telemedicine visit and agreed to proceed.   ROS- continued pain in right neck and shoulder- actually worse than last visit. No chest pain.    Past Medical History-  Patient Active Problem List   Diagnosis Date Noted  . Hyperlipidemia 05/05/2007    Priority: Medium  . Osteoporosis 05/05/2007    Priority: Medium  . Glaucoma 03/12/2016    Priority: Low  . History of colonic polyps 05/05/2007    Priority: Low    Medications- reviewed and updated Current Outpatient Medications  Medication Sig Dispense Refill  . gabapentin (NEURONTIN) 100 MG capsule Take 1 capsule (100 mg total) by mouth 3 (three) times daily. (Patient taking differently: Take 100 mg by mouth 2 (two) times daily. ) 90 capsule 3  . TRAVATAN Z 0.004 % SOLN ophthalmic solution Place 1 drop into the left eye daily.     No  current facility-administered medications for this visit.      Objective:  BP (!) 150/80 (BP Location: Left Arm)   Pulse 67   Wt 116 lb (52.6 kg)   LMP  (LMP Unknown)   BMI 19.76 kg/m  Gen: Patient sounds to be in discomfort in regards to pain Nonlabored breathing Normal speech    Assessment and Plan   Herpes zoster without complication S: Patient was diagnosed with shingles on March 17th.  She declined to take Shingrix worried about side effects.  She did call back 2 days later complaining of significant pain and asked for pain modulator.  We started her on gabapentin 100 mg 3 times a day-she was not sure if this was making a big difference so she went to 2 pills a day.  When she reduced her dosage of gabapentin-pain seemed to worsen  Since that time, patient states she has been having significant pain with her shingles. She has been using gabapentin 100mg  twice a day only.  She is noted it is been much more difficult to get to sleep with this change in regimen. A/P: Shingles with poor control of pain.  Unfortunately patient never took Valtrex.  She is also been cutting down on her gabapentin dose. - We discussed increasing gabapentin to 100 mg in the morning, 100 mg in the afternoon, 200 mg before bed. - After 3 days can add an extra dose to the morning or afternoon - After 3 more days can go to 200 mg 3 times a day-she should let me know if this does  not adequately control her pain and we can adjust further. -Patient's blood pressure noted to be elevated at this time-suspect this is related to poorly controlled pain.  She does not have a history of hypertension-we will monitor at future visits  Future Appointments  Date Time Provider [place]  10/14/2019  1:00 PM [facility]   Lab/Order associations: Herpes zoster without complication  Return precautions advised.  Garret Reddish, MD

## 2018-11-17 NOTE — Patient Instructions (Addendum)
Video visit

## 2018-11-19 ENCOUNTER — Telehealth: Payer: Self-pay | Admitting: Family Medicine

## 2018-11-19 NOTE — Telephone Encounter (Signed)
Copied from [place] [id number]. Topic: General - Inquiry >> Nov 19, 2018  2:28 PM Margot Ables wrote: Reason for CRM: Pt left [facility] General VM stating that her shingles nerve pain is worse than ever. She is requesting call back that gabapentin is not doing the trick.

## 2018-11-20 NOTE — Telephone Encounter (Signed)
Left message to return phone call.

## 2018-11-20 NOTE — Telephone Encounter (Signed)
It sounds like the gabapentin is helpful when at a good dose. Have her increase to 2 in the morning, 2 in afternoon, 3 at night. Give [place] an update next week- may go up on her morning dose as well.   Garret Reddish

## 2018-11-20 NOTE — Telephone Encounter (Signed)
What is her current dose? Please make sure she is not getting new lesions as well.   Garret Reddish

## 2018-11-20 NOTE — Telephone Encounter (Signed)
Pt called last week wanting to stop Gabapentin. Per phone note from 11/13/2018, pt was told that iIf she is taking 3 a day- have her do that for at least for 2-3 weeks and then can try to reduce by 1 pill every 2-3 weeks. May need medicine for long term.   So pt is suppose to take 3 tablets a day. Spoke to pt and she stated that she is taking 2 in the am, 1 tab in the afternoon and 3 at night. Pt stated that the am and pm dose helps her but the afternoon dose is 'useless". I asked pt has she been doing the 3 tabs a day at all and if she is still trying to get off the Gabapentin. Pt stated that she does not know what to do bc she is in pain but the Gabapentin does not work that good. Pt stated that he Shingle rash is drying up but denies any new lesions.   Please advise

## 2018-11-21 ENCOUNTER — Ambulatory Visit (INDEPENDENT_AMBULATORY_CARE_PROVIDER_SITE_OTHER): Payer: Medicare Other | Admitting: Family Medicine

## 2018-11-21 ENCOUNTER — Encounter: Payer: Self-pay | Admitting: Family Medicine

## 2018-11-21 DIAGNOSIS — L03319 Cellulitis of trunk, unspecified: Secondary | ICD-10-CM | POA: Diagnosis not present

## 2018-11-21 MED ORDER — DOXYCYCLINE HYCLATE 100 MG PO TABS: 100.0000 mg | ORAL_TABLET | Freq: Two times a day (BID) | ORAL | 0 refills | Status: DC

## 2018-11-21 NOTE — Telephone Encounter (Signed)
Spoke to pt and she stated that the rash was drying up yesterday but now it open. Pt stated tht she think it may be infected and everything that Dr. Yong Channel told her to look out for in mychart message is happening.   Please advise or do you want me to schedule Doxy.me?

## 2018-11-21 NOTE — Progress Notes (Signed)
Phone [phone]   Subjective:  Virtual visit via Video note  Our team/I connected with Sheila Wood on 11/21/18 at  1:00 PM EDT by a video enabled telemedicine application (webex) and verified that I am speaking with the correct person using two identifiers.  Location patient: Home[place] Location provider: [facility], office Persons participating in the virtual visit:  Patient and son who helped with set up and camera views  Our team/I discussed the limitations of evaluation and management by telemedicine and the availability of in person appointments. In light of current covid-19 pandemic, patient also understands that we are trying to protect them by minimizing in office contact if at all possible.  The patient expressed consent for telemedicine visit and agreed to proceed.   ROS- no fever. No nausea or vomiting.  Some chills. Not feeling fatigued overall.   Past Medical History-  Patient Active Problem List   Diagnosis Date Noted  . Hyperlipidemia 05/05/2007    Priority: Medium  . Osteoporosis 05/05/2007    Priority: Medium  . Glaucoma 03/12/2016    Priority: Low  . History of colonic polyps 05/05/2007    Priority: Low    Medications- reviewed and updated Current Outpatient Medications  Medication Sig Dispense Refill  . doxycycline (VIBRA-TABS) 100 MG tablet Take 1 tablet (100 mg total) by mouth 2 (two) times daily. 20 tablet 0  . gabapentin (NEURONTIN) 100 MG capsule Take 1 capsule (100 mg total) by mouth 3 (three) times daily. (Patient taking differently: Take 100 mg by mouth 2 (two) times daily. ) 90 capsule 3  . TRAVATAN Z 0.004 % SOLN ophthalmic solution Place 1 drop into the left eye daily.     No current facility-administered medications for this visit.      Objective:  Patient did not have thermometer to check temperature Gen: Appears uncomfortable Lungs: nonlabored, normal respiratory rate  Skin: Extensive rash over right neck, shoulder, right upper  chest-multiple scaled over areas-1 open wound/prior vesicle.  Patient pushes on areas and these are tender to touch    Assessment and Plan   #Cellulitis resulting from shingles S:Shingles was starting to dry up then last night opened up again and now has a funny smell.  She denies fever or feeling ill overall-she is very worried about the redness and potential infection  Patient has noted expanding redness surrounding all the areas that had vesicles before.7-8/10 pain aching superficial pain.  This seems to be different than the nerve pain she was experiencing before.Nerve pain seems to be doing somewhat better- 2 in the morning and 3 at bedtime. -Counseled her she can take additional afternoon gabapentin A/P: Patient appears to have a cellulitis resulting from prior shingles.  She has a penicillin allergy-I would like to use Keflex we are going to start with doxycycline and if she fails to improve on this would add Keflex at a later date. -Asked her to call our on-call physician if she starts to feel ill overall -We discussed may take 24 to 48 hours to start to turn the corner -Asked her to update me on Monday if not improving-would add the Keflex as discussed  Future Appointments  Date Time Provider [place]  11/21/2018  1:00 PM Marin Olp, MD [facility]  10/14/2019  1:00 PM [facility]   Lab/Order associations: Cellulitis of trunk, unspecified site of trunk  Meds ordered this encounter  Medications  . doxycycline (VIBRA-TABS) 100 MG tablet    Sig: Take 1  tablet (100 mg total) by mouth 2 (two) times daily.    Dispense:  20 tablet    Refill:  0   Return precautions advised.  Garret Reddish, MD

## 2018-11-21 NOTE — Patient Instructions (Addendum)
Video visit

## 2018-11-21 NOTE — Telephone Encounter (Signed)
I asked if pt would like to do Doxy.me. Pt stated that its fine but she does not know how to work her phone. Pt was advise that someone will walk her through it. Pt agreed with the visit.   Pt cell phone number [phone] [email or web address]

## 2018-11-27 ENCOUNTER — Telehealth: Payer: Self-pay | Admitting: Family Medicine

## 2018-11-27 NOTE — Telephone Encounter (Signed)
FYI  Spoke to pt and per Dr. Ansel Bong verbally, Pt should increase dose to 3am 3 afternoon and 3 at night. Pt stated that she will try that but will need more refills since her previous Rx stated to take 1 3x daily. Pt stated that she just received a refill. Pt was advised to call or office back to  4 days before she runs out. Pt verbalized understanding.

## 2018-11-27 NOTE — Telephone Encounter (Signed)
Copied from [place] [id number]. Topic: General - Inquiry >> Nov 27, 2018  9:42 AM Margot Ables wrote: Reason for CRM: pt stating that she has been taking gabapentin for nerve pain/shingles (2 in the morning, 2 in the afternoon, and 3 in the evening). It doesn't seem to be helping and she is in pain. She is wanting to get off the gabapentin. Pt requesting call back from Dr. Hollice Gong. Please advise.  [id number] [place], [place] [phone] (Phone) [phone] (Fax)

## 2018-11-27 NOTE — Telephone Encounter (Signed)
See note

## 2018-12-01 ENCOUNTER — Telehealth: Payer: Self-pay

## 2018-12-01 NOTE — Telephone Encounter (Signed)
Received a Skype stating the pt left a vm on [facility] answering machine regarding nerve damage. I asked if the Vm could be transferred to see if pt questions was answered. I spoke to pt Thurs. But do not know if the message came before or after contacting pt.   I called pt just to make sure and pt stated that her question was answered. Pt stated that the Rx is still not working and wants to know what she can do. Pt stated she does not want to continue taking a lot of this Rx.   Please advise

## 2018-12-01 NOTE — Telephone Encounter (Signed)
Lets schedule another video visit

## 2018-12-02 ENCOUNTER — Ambulatory Visit (INDEPENDENT_AMBULATORY_CARE_PROVIDER_SITE_OTHER): Payer: Medicare Other | Admitting: Family Medicine

## 2018-12-02 ENCOUNTER — Encounter: Payer: Self-pay | Admitting: Family Medicine

## 2018-12-02 VITALS — BP 113/64 | HR 88 | Temp 98.5°F | Wt 116.0 lb

## 2018-12-02 DIAGNOSIS — B029 Zoster without complications: Secondary | ICD-10-CM

## 2018-12-02 NOTE — Patient Instructions (Addendum)
Video visit

## 2018-12-02 NOTE — Progress Notes (Signed)
Phone [phone]   Subjective:  Virtual visit via Video note. Chief complaint: Chief Complaint  Patient presents with  . Herpes Zoster   This visit type was conducted due to national recommendations for restrictions regarding the COVID-19 Pandemic (e.g. social distancing).  This format is felt to be most appropriate for this patient at this time balancing risks to patient and risks to population by having him in for in person visit.  No physical exam was performed (except for noted visual exam or audio findings with Telehealth visits).    Our team/I connected with Lorin Mercy on 12/02/18 at  3:40 PM EDT by a video enabled telemedicine application ([email or web address]) and verified that I am speaking with the correct person using two identifiers.  Location patient: [place] Location provider: [facility], office Persons participating in the virtual visit:  patient  Our team/I discussed the limitations of evaluation and management by telemedicine and the availability of in person appointments. In light of current covid-19 pandemic, patient also understands that we are trying to protect them by minimizing in office contact if at all possible.  The patient expressed consent for telemedicine visit and agreed to proceed. Patient understands insurance will be billed.   ROS- no fever, chills. Continued pain. Many open blisters have scabbed over except for ones in neck which are slower to heal.    Past Medical History-  Patient Active Problem List   Diagnosis Date Noted  . Hyperlipidemia 05/05/2007    Priority: Medium  . Osteoporosis 05/05/2007    Priority: Medium  . Glaucoma 03/12/2016    Priority: Low  . History of colonic polyps 05/05/2007    Priority: Low    Medications- reviewed and updated Current Outpatient Medications  Medication Sig Dispense Refill  . gabapentin (NEURONTIN) 100 MG capsule Take 1 capsule (100 mg total) by mouth 3 (three) times daily. (Patient taking differently: Take 100 mg  by mouth 2 (two) times daily. ) 90 capsule 3   No current facility-administered medications for this visit.      Objective:  BP 113/64   Pulse 88   Temp 98.5 F (36.9 C) (Oral)   Wt 116 lb (52.6 kg)   LMP  (LMP Unknown)   BMI 19.76 kg/m  Gen: NAD, appears uncomfortable Lungs: nonlabored, normal respiratory rate  Skin: intense erythema on right upper chest has resolved. Mild erythema and prior blisters or open blisters have scabbed over. Patient has some open sores on right neck. Rash does not pass midline     Assessment and Plan   # Herpes zoster/shingles and related pain S:chest is doing better- now dried up. Still has a few patches on the neck that are particularly tender but doesn't have redness surrounding this like cellulitis that was treated last week. She finished antibiotic.   She takes 3 gabapentin in AM, 3 at 1 PM, and 3 at night.  nigthtime is the worst- just cant sleep. She does not want to be on way higher doses of gabapentin as makes her feel groggy.  A/P: Poor control of pain -add capsaicin in the day and increase gabapentin to 400mg  before bed.  - we discussed prior cellulitis has improved but should continue to watch for recurrence particularly with how her neck is doing.  - I did tell her would order some tramadol for bedtime instead of further increasing gabapentin if these next steps (above) are not effective. She would not need another video visit for this as we have already planned it.  Future Appointments  Date Time Provider [place]  10/14/2019  1:00 PM [facility]   Lab/Order associations: Herpes zoster without complication  Return precautions advised.  Garret Reddish, MD

## 2018-12-02 NOTE — Telephone Encounter (Signed)
Pt has been schdeuled

## 2018-12-03 ENCOUNTER — Ambulatory Visit (INDEPENDENT_AMBULATORY_CARE_PROVIDER_SITE_OTHER): Payer: Medicare Other | Admitting: Physician Assistant

## 2018-12-03 ENCOUNTER — Encounter (HOSPITAL_BASED_OUTPATIENT_CLINIC_OR_DEPARTMENT_OTHER): Payer: Self-pay | Admitting: *Deleted

## 2018-12-03 ENCOUNTER — Other Ambulatory Visit: Payer: Self-pay

## 2018-12-03 ENCOUNTER — Encounter: Payer: Self-pay | Admitting: Physician Assistant

## 2018-12-03 ENCOUNTER — Emergency Department (HOSPITAL_BASED_OUTPATIENT_CLINIC_OR_DEPARTMENT_OTHER)
Admission: EM | Admit: 2018-12-03 | Discharge: 2018-12-03 | Disposition: A | Discharge status: Home / Self Care | Payer: Medicare Other | Attending: Emergency Medicine | Admitting: Emergency Medicine

## 2018-12-03 DIAGNOSIS — Z79899 Other long term (current) drug therapy: Secondary | ICD-10-CM | POA: Diagnosis not present

## 2018-12-03 DIAGNOSIS — B028 Zoster with other complications: Secondary | ICD-10-CM | POA: Diagnosis not present

## 2018-12-03 DIAGNOSIS — B029 Zoster without complications: Secondary | ICD-10-CM

## 2018-12-03 HISTORY — DX: Zoster without complications: B02.9

## 2018-12-03 LAB — CBC WITH DIFFERENTIAL/PLATELET
Abs Immature Granulocytes: 0.01 10*3/uL (ref 0.00–0.07)
Basophils Absolute: 0 10*3/uL (ref 0.0–0.1)
Basophils Relative: 1 %
Eosinophils Absolute: 0.1 10*3/uL (ref 0.0–0.5)
Eosinophils Relative: 2 %
HCT: 39.1 % (ref 36.0–46.0)
Hemoglobin: 12.3 g/dL (ref 12.0–15.0)
Immature Granulocytes: 0 %
Lymphocytes Relative: 31 %
Lymphs Abs: 1.6 10*3/uL (ref 0.7–4.0)
MCH: 29.9 pg (ref 26.0–34.0)
MCHC: 31.5 g/dL (ref 30.0–36.0)
MCV: 94.9 fL (ref 80.0–100.0)
Monocytes Absolute: 0.5 10*3/uL (ref 0.1–1.0)
Monocytes Relative: 9 %
Neutro Abs: 3.1 10*3/uL (ref 1.7–7.7)
Neutrophils Relative %: 57 %
Platelets: 309 10*3/uL (ref 150–400)
RBC: 4.12 MIL/uL (ref 3.87–5.11)
RDW: 12.9 % (ref 11.5–15.5)
WBC: 5.3 10*3/uL (ref 4.0–10.5)
nRBC: 0 % (ref 0.0–0.2)

## 2018-12-03 LAB — BASIC METABOLIC PANEL
Anion gap: 7 (ref 5–15)
BUN: 19 mg/dL (ref 8–23)
CO2: 24 mmol/L (ref 22–32)
Calcium: 9.1 mg/dL (ref 8.9–10.3)
Chloride: 103 mmol/L (ref 98–111)
Creatinine, Ser: 0.62 mg/dL (ref 0.44–1.00)
GFR calc Af Amer: >60 mL/min (ref >60)
GFR calc non Af Amer: >60 mL/min (ref >60)
Glucose, Bld: 115 mg/dL — ABNORMAL HIGH (ref 70–99)
Potassium: 4 mmol/L (ref 3.5–5.1)
Sodium: 134 mmol/L — ABNORMAL LOW (ref 135–145)

## 2018-12-03 LAB — SEDIMENTATION RATE: Sed Rate: 21 mm/hr (ref 0–22)

## 2018-12-03 MED ORDER — PREDNISONE 10 MG PO TABS: 40.0000 mg | ORAL_TABLET | Freq: Every day | ORAL | 0 refills | Status: DC

## 2018-12-03 MED ORDER — OXYCODONE-ACETAMINOPHEN 5-325 MG PO TABS
1.0000 | ORAL_TABLET | Freq: Once | ORAL | Status: AC
  Administered 2018-12-03: 1 via ORAL
  Filled 2018-12-03: qty 1

## 2018-12-03 MED ORDER — DOXYCYCLINE HYCLATE 100 MG PO CAPS: 100.0000 mg | ORAL_CAPSULE | Freq: Two times a day (BID) | ORAL | 0 refills | Status: AC

## 2018-12-03 MED ORDER — OXYCODONE-ACETAMINOPHEN 5-325 MG PO TABS: 1.0000 | ORAL_TABLET | Freq: Three times a day (TID) | ORAL | 0 refills | Status: DC | PRN

## 2018-12-03 NOTE — Discharge Instructions (Addendum)
You have been seen today for a wound. Please read and follow all provided instructions.   1. Medications: short course of steroids, percocet for severe pain (this medication may make you drowsy - do not operate heavy machinery or drive while taking this medication), and doxycycline (antibiotic), usual home medications 2. Treatment: rest, drink plenty of fluids 3. Follow Up: Please follow up with your primary doctor in 2-5 days for discussion of your diagnoses and further evaluation after today's visit; if you do not have a primary care doctor use the resource guide provided to find one; Please return to the ER for any new or worsening symptoms. Please obtain all of your results from medical records or have your doctors office obtain the results - share them with your doctor - you should be seen at your doctors office. Call today to arrange your follow up.   Take medications as prescribed. Please review all of the medicines and only take them if you do not have an allergy to them. Return to the emergency room for worsening condition or new concerning symptoms. Follow up with your regular doctor. If you don't have a regular doctor use one of the numbers below to establish a primary care doctor.  Please be aware that if you are taking birth control pills, taking other prescriptions, ESPECIALLY ANTIBIOTICS may make the birth control ineffective - if this is the case, either do not engage in sexual activity or use alternative methods of birth control such as condoms until you have finished the medicine and your family doctor says it is OK to restart them. If you are on a blood thinner such as COUMADIN, be aware that any other medicine that you take may cause the coumadin to either work too much, or not enough - you should have your coumadin level rechecked in next 7 days if this is the case.  ?  It is also a possibility that you have an allergic reaction to any of the medicines that you have been prescribed -  Everybody reacts differently to medications and while MOST people have no trouble with most medicines, you may have a reaction such as nausea, vomiting, rash, swelling, shortness of breath. If this is the case, please stop taking the medicine immediately and contact your physician.  ?  You should return to the ER if you develop severe or worsening symptoms.   Emergency Department Resource Guide 1) Find a Doctor and Pay Out of Pocket Although you won't have to find out who is covered by your insurance plan, it is a good idea to ask around and get recommendations. You will then need to call the office and see if the doctor you have chosen will accept you as a new patient and what types of options they offer for patients who are self-pay. Some doctors offer discounts or will set up payment plans for their patients who do not have insurance, but you will need to ask so you aren't surprised when you get to your appointment.  2) Contact Your Local Health Department Not all health departments have doctors that can see patients for sick visits, but many do, so it is worth a call to see if yours does. If you don't know where your local health department is, you can check in your phone book. The CDC also has a tool to help you locate your state's health department, and many state websites also have listings of all of their local health departments.  3) Find a  Walk-in Clinic If your illness is not likely to be very severe or complicated, you may want to try a walk in clinic. These are popping up all over the country in pharmacies, drugstores, and shopping centers. They're usually staffed by nurse practitioners or physician assistants that have been trained to treat common illnesses and complaints. They're usually fairly quick and inexpensive. However, if you have serious medical issues or chronic medical problems, these are probably not your best option.  No Primary Care Doctor: Call Health Connect at  [phone]   - they can help you locate a primary care doctor that  accepts your insurance, provides certain services, etc. Physician Referral Service- [phone]  Chronic Pain Problems: Organization         Address  Phone   Notes  [facility]  [phone] Patients need to be referred by their primary care doctor.   Medication Assistance: Organization         Address  Phone   Notes  [facility] Medication [facility] [address] [place], [place] [address] [phone] --Must be a resident of [facility] -- Must have NO insurance coverage whatsoever (no Medicaid/ Medicare, etc.) -- The pt. MUST have a primary care doctor that directs their care regularly and follows them in the community   [facility]  [phone]   [employer]  [phone]    Agencies that provide inexpensive medical care: Organization         Address  Phone   Notes  [place]  [phone]   [facility]    [phone]   [facility] [place], [place] [address] [phone]   [place] [address] [place] [phone]   [facility]    [phone]   [facility]    [phone]   [place] [address], [place] Phone:  [phone], Fax:  [phone] Hours of Operation:  9 am - 6 pm, M-F.  Also accepts Medicaid/Medicare and self-pay.  [facility] for [place], [address], [place] Phone: [phone], Fax: [phone]. Hours of Operation:  8:30 am - 5:30 pm, M-F.  Also accepts Medicaid and self-pay.  [facility] [address] [place] Phone: [phone]   [place], [place], [place] [phone], Ext. [phone] Mondays & Thursdays: 7-9 AM.  First 15 patients are seen on a first come, first serve basis.    [place] Providers:  Organization         Address  Phone   Notes  [facility] [address], [place] [phone] Also accepts self-pay patients.  [facility] [address], [place]  [phone]   [place], [address], [place] [phone]   [facility] [address] [place] [phone]   Lucianne Lei [address], [place]   [phone] Only accepts [place] patients after they have their name applied to their card.   Self-Pay (no insurance) in [facility]:  Organization         Address  Phone   Notes  [facility], Investment banker, corporate Internal Medicine [place] (  [phone]   [facility] [place] [phone]   [facility] [place]  [place], [place], [place] [phone]   [facility]/Dr. Osei-Bonsu  [address], [place] or [address], [place] [phone] Phone number for both [place] and [place] locations is the same.  [facility] [address], [place] [phone]   [facility] [address] [place] or [address] [phone]   [facility] [address], [place] [phone], phone; [phone], fax Sees patients 1st and 3rd Saturday of every month.  Must not qualify for public or private insurance (i.e. Medicaid, Medicare, [place] [employer] Choice, Veterans' Benefits)  Household income should be no more than 200% of the poverty level The clinic cannot treat you if you are pregnant or think you are pregnant  Sexually transmitted diseases are not treated at the clinic.

## 2018-12-03 NOTE — ED Notes (Signed)
Dressed open shingles wound with xeroform, non-adherent gauze, and paper tape, per EDP's instructions

## 2018-12-03 NOTE — Progress Notes (Signed)
Virtual Visit via Video   I connected with Sheila Wood on 12/03/18 at  3:40 PM EDT by a video enabled telemedicine application and verified that I am speaking with the correct person using two identifiers. Location patient: Home Location provider: [facility], Office Persons participating in the virtual visit: Aliceson Dolbow, [place], [place]   I discussed the limitations of evaluation and management by telemedicine and the availability of in person appointments. The patient expressed understanding and agreed to proceed.  Subjective:   HPI:   Patient has been seen multiple times for herpes zoster and cellulitis, with difficulty managing her pain, over the past few weeks.  She called the office today because she has concerns with possible recurrence of infection and new open area on her neck, as well as significantly increased pain.  Patient's son providing and supplementing history, to summarize overall recent course for this provider.  While they do feel as though patient's cellulitis and shingles improved on the trunk, this new area on her neck has significantly worsened and recently opened up causing the most significant pain she has had this entire course.  Patient was in such severe pain today, per family, that she had almost fallen and passed out several times.  They also stated that they felt like she was slightly confused.  She denies fever.  She is taking her gabapentin without relief.    ROS: See pertinent positives and negatives per HPI.  Patient Active Problem List   Diagnosis Date Noted  . Glaucoma 03/12/2016  . Hyperlipidemia 05/05/2007  . Osteoporosis 05/05/2007  . History of colonic polyps 05/05/2007    Social History   Tobacco Use  . Smoking status: Never Smoker  . Smokeless tobacco: Never Used  Substance Use Topics  . Alcohol use: Yes    Alcohol/week: 2.0 standard drinks    Types: 2 Standard drinks or equivalent per week    Comment: glass of wine- rarely uses     Current Outpatient Medications:  .  gabapentin (NEURONTIN) 100 MG capsule, Take 1 capsule (100 mg total) by mouth 3 (three) times daily. (Patient taking differently: Take 100 mg by mouth 2 (two) times daily. ), Disp: 90 capsule, Rfl: 3  Allergies  Allergen Reactions  . Penicillins Rash  . Aspirin     Objective:   VITALS: Per patient if applicable, see vitals. GENERAL: Alert, appears well and in no acute distress. HEENT: Atraumatic, conjunctiva clear, no obvious abnormalities on inspection of external nose and ears. NECK: Normal movements of the head and neck. CARDIOPULMONARY: No increased WOB. Speaking in clear sentences. I:E ratio WNL.  MS: Moves all visible extremities without noticeable abnormality. PSYCH: Pleasant and cooperative, well-groomed. Speech normal rate and rhythm. Affect is appropriate. Insight and judgement are appropriate. Attention is focused, linear, and appropriate.  NEURO: CN grossly intact. Oriented as arrived to appointment on time with no prompting. Moves both UE equally.  SKIN:  Based on my limited ability to evaluate patient's skin via WebCam, obvious large area of ulceration on posterior neck with areas of possible necrosis?  No obvious drainage seen.  Assessment and Plan:   Diagnoses and all orders for this visit:  Herpes zoster without complication   Discussion with family and patient regarding how to proceed with care regarding this visit today.  I did offer restarting antibiotics and adding tramadol for pain, as discussed in prior note from PCP, however patient and family have concerns that this is not going away and every time  it returns it is significantly worse.  Due to patient's worsening of overall symptoms, we did decide for patient to go to the emergency department for further evaluation and treatment.  I did recommend that they go to [facility] for further evaluation and treatment.  Patient and son verbalized understanding and were in  agreement.  . Reviewed expectations re: course of current medical issues. . Discussed self-management of symptoms. . Outlined signs and symptoms indicating need for more acute intervention. . Patient verbalized understanding and all questions were answered. Marland [facility] Health Maintenance issues including appropriate healthy diet, exercise, and smoking avoidance were discussed with patient. . See orders for this visit as documented in the electronic medical record.  I discussed the assessment and treatment plan with the patient. The patient was provided an opportunity to ask questions and all were answered. The patient agreed with the plan and demonstrated an understanding of the instructions.   The patient was advised to call back or seek an in-person evaluation if the symptoms worsen or if the condition fails to improve as anticipated.     [place], [place] 12/03/2018

## 2018-12-03 NOTE — ED Provider Notes (Signed)
[facility] EMERGENCY DEPARTMENT Provider Note   CSN: [id number] Arrival date & time: 12/03/18  1604    History   Chief Complaint Chief Complaint  Patient presents with  . Herpes Zoster    HPI Sheila Wood is a 84 y.o. female with a PMH of HLD, osteoporosis, and shingles presenting with a wound on the posterior aspect of her neck onset 1 month ago. Patient reports she was diagnosed with shingles and rash on chest has been improving. Patient states wound on the posterior aspect of her neck worsened today. Patient reports bleeding and discharge over wound. Patient reports intermittent severe burning pain. Patient reports taking gabapentin without relief. Patient states she was prescribed doxycycline and finished the course of antibiotics a few days ago. Patient denies fever, abdominal pain, nausea, or vomiting. Patient denies having Shingrix vaccine. Patient states she has been evaluated by her PCP and advised to come to the ER.      HPI  Past Medical History:  Diagnosis Date  . History of colonic polyps   . Hyperlipidemia   . Internal hemorrhoid   . Osteoporosis   . Shingles     Patient Active Problem List   Diagnosis Date Noted  . Glaucoma 03/12/2016  . Hyperlipidemia 05/05/2007  . Osteoporosis 05/05/2007  . History of colonic polyps 05/05/2007    Past Surgical History:  Procedure Laterality Date  . CHOLECYSTECTOMY     at time of colectomy  . COLONOSCOPY    . ESOPHAGOGASTRODUODENOSCOPY    . RIGHT COLECTOMY     adenomatous polyp     OB History   No obstetric history on file.      Home Medications    Prior to Admission medications   Medication Sig Start Date End Date Taking? Authorizing Provider  gabapentin (NEURONTIN) 100 MG capsule Take 1 capsule (100 mg total) by mouth 3 (three) times daily. Patient taking differently: Take 100 mg by mouth 2 (two) times daily.  11/06/18  Yes Marin Olp, MD  ibuprofen (ADVIL,MOTRIN) 400 MG tablet Take 400 mg  by mouth every 6 (six) hours as needed.   Yes [provider]  doxycycline (VIBRAMYCIN) 100 MG capsule Take 1 capsule (100 mg total) by mouth 2 (two) times daily for 7 days. 12/03/18 12/10/18  Darlin Drop P, PA-C  oxyCODONE-acetaminophen (PERCOCET/ROXICET) 5-325 MG tablet Take 1 tablet by mouth every 8 (eight) hours as needed for severe pain. 12/03/18   Darlin Drop P, PA-C  predniSONE (DELTASONE) 10 MG tablet Take 4 tablets (40 mg total) by mouth daily with breakfast for 5 days. 12/03/18 12/08/18  Arville Lime, PA-C    Family History Family History  Problem Relation Age of Onset  . Colon cancer Brother   . Heart disease Mother        unclear history  . Diabetes Sister   . Lung cancer Father   . Kidney disease Son   . Prostate cancer Brother     Social History Social History   Tobacco Use  . Smoking status: Never Smoker  . Smokeless tobacco: Never Used  Substance Use Topics  . Alcohol use: Yes    Alcohol/week: 2.0 standard drinks    Types: 2 Standard drinks or equivalent per week    Comment: glass of wine- rarely uses  . Drug use: No     Allergies   Penicillins and Aspirin   Review of Systems Review of Systems  Constitutional: Negative for chills, diaphoresis and fever.  Respiratory: Negative  for cough and shortness of breath.   Cardiovascular: Negative for chest pain.  Gastrointestinal: Negative for abdominal pain, nausea and vomiting.  Endocrine: Negative for cold intolerance and heat intolerance.  Skin: Positive for rash and wound.  Neurological: Negative for dizziness, weakness, numbness and headaches.  Hematological: Negative for adenopathy.    Physical Exam Updated Vital Signs BP (!) 156/90   Pulse 80   Temp 98.3 F (36.8 C)   Resp 18   LMP  (LMP Unknown)   SpO2 99%   Physical Exam Vitals signs and nursing note reviewed.  Constitutional:      General: She is not in acute distress.    Appearance: She is well-developed. She is not  diaphoretic.  HENT:     Head: Normocephalic and atraumatic.  Neck:     Musculoskeletal: Normal range of motion.  Cardiovascular:     Rate and Rhythm: Normal rate and regular rhythm.     Heart sounds: Normal heart sounds. No murmur. No friction rub. No gallop.   Pulmonary:     Effort: Pulmonary effort is normal. No respiratory distress.     Breath sounds: Normal breath sounds. No wheezing or rales.  Abdominal:     Palpations: Abdomen is soft.     Tenderness: There is no abdominal tenderness.  Musculoskeletal: Normal range of motion.  Skin:    General: Skin is warm.     Findings: Erythema and rash present.  Neurological:     Mental Status: She is alert.          ED Treatments / Results  Labs (all labs ordered are listed, but only abnormal results are displayed) Labs Reviewed  BASIC METABOLIC PANEL - Abnormal; Notable for the following components:      Result Value   Sodium 134 (*)    Glucose, Bld 115 (*)    All other components within normal limits  CBC WITH DIFFERENTIAL/PLATELET  SEDIMENTATION RATE    EKG None  Radiology No results found.  Procedures Procedures (including critical care time)  Medications Ordered in ED Medications  oxyCODONE-acetaminophen (PERCOCET/ROXICET) 5-325 MG per tablet 1 tablet (has no administration in time range)   Initial Impression / Assessment and Plan / ED Course  I have reviewed the triage vital signs and the nursing notes.  Pertinent labs & imaging results that were available during my care of the patient were reviewed by me and considered in my medical decision making (see chart for details).  Clinical Course as of Dec 02 1745  Wed Dec 03, 2018  1707 WBCs are within normal limits.  WBC: 5.3 [AH]    Clinical Course User Index [AH] Arville Lime, PA-C      Patient presents with a wound on the posterior aspect of her neck after shingles infection. Labs and vitals reviewed. Pt is without gross abscess for which I&D  would be possible.  Pt is alert, oriented, NAD, afebrile, non tachycardic, nonseptic and nontoxic appearing.  WBCs are within normal limits. Provided wound care and pain medicine while in the ER. Daughter will drive patient back to home. Pt to be d/c on oral antibiotics with strict f/u instructions. Will prescribe a short course of steroids and pain medicine for severe pain. Discussed pain medicine may cause drowsiness and patient must avoid driving or operating heavy machinery while taking this medication. Advised patient to follow up with PCP. Discussed return precautions with patient. Patient states she understands and agrees with plan.   Final Clinical Impressions(s) /  ED Diagnoses   Final diagnoses:  Herpes zoster with other complication    ED Discharge Orders         Ordered    oxyCODONE-acetaminophen (PERCOCET/ROXICET) 5-325 MG tablet  Every 8 hours PRN     12/03/18 1741    predniSONE (DELTASONE) 10 MG tablet  Daily with breakfast     12/03/18 1741    doxycycline (VIBRAMYCIN) 100 MG capsule  2 times daily     12/03/18 1741           Arville Lime, [place] 12/03/18 1748    Charlesetta Shanks, MD 12/03/18 1802

## 2018-12-03 NOTE — ED Triage Notes (Signed)
Pt here for shingles wound, dx 3/30

## 2018-12-03 NOTE — ED Notes (Signed)
Pt verbalizes understanding of d/c instructions and denies any further need at this time. 

## 2018-12-03 NOTE — ED Provider Notes (Signed)
Medical screening examination/treatment/procedure(s) were conducted as a shared visit with non-physician practitioner(s) and myself.  I personally evaluated the patient during the encounter.  None Patient got diagnosed with shingles approximately a month ago.  She declined treatment with antiviral and prednisone.  He has been taking Neurontin for pain but having worsening pain in the large ulcerating lesion on the back of her neck.  Reports that shoots of pain up to her head sometimes.  No associated fever, no weakness numbness tingling to extremities.  Reports the main reason for coming in is because the wound just will not heal and she is still suffering pain from it.  Is alert and appropriate.  Nontoxic.  See attached images for zoster lesions.  Patient clearly had severe episode of shingles.  She is resolving most of the lesions.  She has not developed fever, encephalopathy or neurologic symptoms.  She is having difficulty with a large nonhealing ulcerative wound persists and being very painful for her.  At this time, plan will be to use none stick Xeroform dressing over this ulceration.  Patient has been on doxycycline from her PCP.  She reports about 6 days worse.  Will opt to extend this for another 7 days and add a burst of prednisone and Percocet for pain control.  Patient will need close follow-up and supervision.  May need referral to dermatology and wound care for ulcerating wound.   Charlesetta Shanks, MD 12/03/18 1735

## 2018-12-08 ENCOUNTER — Encounter: Payer: Self-pay | Admitting: Family Medicine

## 2018-12-08 ENCOUNTER — Ambulatory Visit (INDEPENDENT_AMBULATORY_CARE_PROVIDER_SITE_OTHER): Payer: Medicare Other | Admitting: Family Medicine

## 2018-12-08 VITALS — BP 138/74 | Temp 97.7°F

## 2018-12-08 DIAGNOSIS — B029 Zoster without complications: Secondary | ICD-10-CM | POA: Diagnosis not present

## 2018-12-08 MED ORDER — PREGABALIN 50 MG PO CAPS: 50.0000 mg | ORAL_CAPSULE | Freq: Three times a day (TID) | ORAL | 3 refills | Status: DC

## 2018-12-08 NOTE — Progress Notes (Signed)
Phone [phone]   Subjective:  Virtual visit via Video note. Chief complaint: Chief Complaint  Patient presents with  . Herpes Zoster   This visit type was conducted due to national recommendations for restrictions regarding the COVID-19 Pandemic (e.g. social distancing).  This format is felt to be most appropriate for this patient at this time balancing risks to patient and risks to population by having him in for in person visit.  No physical exam was performed (except for noted visual exam or audio findings with Telehealth visits).    Our team/I connected with Lorin Mercy on 12/08/18 at 10:00 AM EDT by a video enabled telemedicine application (doxy.me) and verified that I am speaking with the correct person using two identifiers.  Location patient: Home-O2 Location provider: [facility], office Persons participating in the virtual visit:  patient  Our team/I discussed the limitations of evaluation and management by telemedicine and the availability of in person appointments. In light of current covid-19 pandemic, patient also understands that we are trying to protect them by minimizing in office contact if at all possible.  The patient expressed consent for telemedicine visit and agreed to proceed. Patient understands insurance will be billed.   ROS- no fever, chills. Redness on back is improving- wound has now scabbed over. Still having signficant pain particularly in the neck.    Past Medical History-  Patient Active Problem List   Diagnosis Date Noted  . Hyperlipidemia 05/05/2007    Priority: Medium  . Osteoporosis 05/05/2007    Priority: Medium  . Glaucoma 03/12/2016    Priority: Low  . History of colonic polyps 05/05/2007    Priority: Low    Medications- reviewed and updated Current Outpatient Medications  Medication Sig Dispense Refill  . doxycycline (VIBRAMYCIN) 100 MG capsule Take 1 capsule (100 mg total) by mouth 2 (two) times daily for 7 days. 14 capsule 0  .  gabapentin (NEURONTIN) 100 MG capsule Take 1 capsule (100 mg total) by mouth 3 (three) times daily. (Patient taking differently: Take 300 mg by mouth 3 (three) times daily. ) 90 capsule 3  . ibuprofen (ADVIL,MOTRIN) 400 MG tablet Take 400 mg by mouth every 6 (six) hours as needed.     No current facility-administered medications for this visit.      Objective:  BP 138/74 (BP Location: Left Arm, Patient Position: Sitting, Cuff Size: Normal)   Temp 97.7 F (36.5 C) (Oral)   LMP  (LMP Unknown)  Gen: Appears uncomfortable Lungs: nonlabored, normal respiratory rate  Skin: Prior open wounds on neck are now closed-all areas scabbed over.  Still with erythema on the chest and neck but not expanding past the previous borders and appears less intense.     Assessment and Plan   #Herpes zoster S: Patient was seen in the emergency room on the 15th-she was started on doxycycline again for concern of infection this time on the back-she reports the redness has improved.  The wound in that area has scabbed over.  She was given oxycodone for pain but unfortunately Oxycodone didn't help.  She is on 300 mg of gabapentin 3 times a day with an additional 100 mg at night but would like to have better pain control.  Has tried ibuprofen as well which has not been helpful. A/P: Significant pain from herpes zoster which is poorly controlled.  We are going to start low-dose Lyrica 50 mg twice a day- I wrote this for 3 times a day and we are going to  touch base later this week and potentially change to 3 times a day.  She is going to reduce gabapentin to 100 mg 3 times a day for the next 3 days and then stop as we are starting the Lyrica.  She is going to stop ibuprofen is not helpful.  She is already out of oxycodone and does not want a refill as did not find it helpful. -She is going consider seeing wound care but since areas have scabbed over she is leaning away from this.She was referred from emergency room -She will  finish doxycycline course  Advised her to call in for Thursday or Friday follow-up Future Appointments  Date Time Provider Anna  10/14/2019  1:00 PM [facility]   Meds ordered this encounter  Medications  . pregabalin (LYRICA) 50 MG capsule    Sig: Take 1 capsule (50 mg total) by mouth 3 (three) times daily.    Dispense:  90 capsule    Refill:  3    Return precautions advised.  Garret Reddish, MD

## 2018-12-08 NOTE — Patient Instructions (Addendum)
Video visit

## 2018-12-15 ENCOUNTER — Ambulatory Visit (INDEPENDENT_AMBULATORY_CARE_PROVIDER_SITE_OTHER): Payer: Medicare Other | Admitting: Family Medicine

## 2018-12-15 ENCOUNTER — Encounter: Payer: Self-pay | Admitting: Family Medicine

## 2018-12-15 VITALS — BP 118/63 | HR 71 | Temp 97.1°F | Ht 65.0 in | Wt 118.0 lb

## 2018-12-15 DIAGNOSIS — B029 Zoster without complications: Secondary | ICD-10-CM | POA: Diagnosis not present

## 2018-12-15 MED ORDER — PREGABALIN 100 MG PO CAPS: 100.0000 mg | ORAL_CAPSULE | Freq: Three times a day (TID) | ORAL | 2 refills | Status: DC

## 2018-12-15 NOTE — Progress Notes (Signed)
Phone [phone]   Subjective:  Virtual visit via Video note. Chief complaint: Chief Complaint  Patient presents with   Herpes Zoster    Pt claims she is still in pain even after increasing dosage of gabapentin or lyrica   This visit type was conducted due to national recommendations for restrictions regarding the COVID-19 Pandemic (e.g. social distancing).  This format is felt to be most appropriate for this patient at this time balancing risks to patient and risks to population by having him in for in person visit.  No physical exam was performed (except for noted visual exam or audio findings with Telehealth visits).    Our team/I connected with Lorin Mercy on 12/15/18 at  8:40 AM EDT by a video enabled telemedicine application (doxy.me) and verified that I am speaking with the correct person using two identifiers.  Location patient: Home-O2 Location provider: [facility], office Persons participating in the virtual visit:  patient  Our team/I discussed the limitations of evaluation and management by telemedicine and the availability of in person appointments. In light of current covid-19 pandemic, patient also understands that we are trying to protect them by minimizing in office contact if at all possible.  The patient expressed consent for telemedicine visit and agreed to proceed. Patient understands insurance will be billed.   ROS- no fever, chills. Rash on chest and neck are improving. Continued pain in chest and neck- rather intense.    Past Medical History-  Patient Active Problem List   Diagnosis Date Noted   Hyperlipidemia 05/05/2007    Priority: Medium   Osteoporosis 05/05/2007    Priority: Medium   Glaucoma 03/12/2016    Priority: Low   History of colonic polyps 05/05/2007    Priority: Low    Medications- reviewed and updated Current Outpatient Medications  Medication Sig Dispense Refill   pregabalin (LYRICA) 100 MG capsule Take 1 capsule (100 mg total) by  mouth 3 (three) times daily. 90 capsule 2   No current facility-administered medications for this visit.      Objective:  BP 118/63    Pulse 71    Temp (!) 97.1 F (36.2 C) (Oral)    Ht 5\' 5"  (1.651 m)    Wt 118 lb (53.5 kg)    LMP  (LMP Unknown)    BMI 19.64 kg/m  Gen: NAD, resting comfortably Lungs: nonlabored, normal respiratory rate  Skin: appears dry, skin over right chest largely normal- minimal erythema- moderate erythema on neck with some scabbed over areas (continues to appear to improve)      Assessment and Plan   #Herpes zoster S: Patient is still in significant pain.  Has a very difficult time sleeping as a result.  She has not found treatments this far effective.  She has started the lyrica twice a day and now is up to 3x a day. She is off the gabapentin. Not overly sedated or sleepy  She describes a burning like pain in different directions. May cool off for a minute then come back. 8-9/10. She is now off gabapentin A/P: Poor control of pain despite Lyrica 50 mg 3 times a day.  She seems to be tolerating the medication well-we will increase to 100 mg 3 times a day. - Did some counseling about why her pain pattern is particularly bad-age, no shingles shot, declined to take Valtrex.  Really sorry she is having a difficult time with this. -We discussed possibly titrating again in 2 weeks or so not really helping  improved pain control with this higher dose -She may add Tylenol arthritis every 8 hours as well  Future Appointments  Date Time Provider Graf  10/14/2019  1:00 PM [facility]   Lab/Order associations: Herpes zoster without complication  Meds ordered this encounter  Medications   pregabalin (LYRICA) 100 MG capsule    Sig: Take 1 capsule (100 mg total) by mouth 3 (three) times daily.    Dispense:  90 capsule    Refill:  2    Return precautions advised.  Garret Reddish, MD

## 2018-12-15 NOTE — Patient Instructions (Addendum)
There are no preventive care reminders to display for this patient.  Depression screen [facility] 2/9 09/26/2018 03/27/2017 12/29/2015  Decreased Interest 0 0 0  Down, Depressed, Hopeless 0 0 0  PHQ - 2 Score 0 0 0   Video visit

## 2018-12-31 ENCOUNTER — Telehealth: Payer: Self-pay | Admitting: Family Medicine

## 2018-12-31 ENCOUNTER — Encounter: Payer: Self-pay | Admitting: Family Medicine

## 2018-12-31 ENCOUNTER — Ambulatory Visit (INDEPENDENT_AMBULATORY_CARE_PROVIDER_SITE_OTHER): Payer: Medicare Other | Admitting: Family Medicine

## 2018-12-31 VITALS — BP 112/67 | HR 69 | Temp 97.1°F | Ht 65.0 in | Wt 115.0 lb

## 2018-12-31 DIAGNOSIS — B029 Zoster without complications: Secondary | ICD-10-CM | POA: Diagnosis not present

## 2018-12-31 DIAGNOSIS — B0229 Other postherpetic nervous system involvement: Secondary | ICD-10-CM

## 2018-12-31 MED ORDER — PREGABALIN 150 MG PO CAPS: 150.0000 mg | ORAL_CAPSULE | Freq: Three times a day (TID) | ORAL | 2 refills | Status: DC

## 2018-12-31 NOTE — Telephone Encounter (Signed)
We discussed at visit giving current medicine a 2 week trial first- does she not want to do that? If not- may refer to see if they have any other ideas for postherpetic neuralgia.

## 2018-12-31 NOTE — Telephone Encounter (Signed)
Please see message and advise.  Thank you. ° °

## 2018-12-31 NOTE — Telephone Encounter (Signed)
Copied from [place] [id number]. Topic: Referral - Request for Referral >> Dec 31, 2018  1:07 PM Nils Flack wrote: Has patient seen PCP for this complaint? Yes.   *If NO, is insurance requiring patient see PCP for this issue before PCP can refer them? Referral for which specialty: neuro Preferred provider/office: [place] Reason for referral: for shingles  Cb is [phone]

## 2018-12-31 NOTE — Patient Instructions (Addendum)
There are no preventive care reminders to display for this patient.  Depression screen [facility] 2/9 09/26/2018 03/27/2017 12/29/2015  Decreased Interest 0 0 0  Down, Depressed, Hopeless 0 0 0  PHQ - 2 Score 0 0 0   Video visit

## 2018-12-31 NOTE — Progress Notes (Signed)
Phone [phone]   Subjective:  Virtual visit via Video note. Chief complaint: Chief Complaint  Patient presents with  . Herpes Zoster    Follow up visit    This visit type was conducted due to national recommendations for restrictions regarding the COVID-19 Pandemic (e.g. social distancing).  This format is felt to be most appropriate for this patient at this time balancing risks to patient and risks to population by having him in for in person visit.  No physical exam was performed (except for noted visual exam or audio findings with Telehealth visits).    Our team/I connected with Lorin Mercy on 12/31/18 at 10:00 AM EDT by a video enabled telemedicine application (doxy.me) and verified that I am speaking with the correct person using two identifiers.  Location patient: Home-O2 Location provider: [facility], office Persons participating in the virtual visit:  patient  Our team/I discussed the limitations of evaluation and management by telemedicine and the availability of in person appointments. In light of current covid-19 pandemic, patient also understands that we are trying to protect them by minimizing in office contact if at all possible.  The patient expressed consent for telemedicine visit and agreed to proceed. Patient understands insurance will be billed.   ROS- no fever, chills, nausea, vomiting, expanding rash   Past Medical History-  Patient Active Problem List   Diagnosis Date Noted  . Hyperlipidemia 05/05/2007    Priority: Medium  . Osteoporosis 05/05/2007    Priority: Medium  . Glaucoma 03/12/2016    Priority: Low  . History of colonic polyps 05/05/2007    Priority: Low    Medications- reviewed and updated Current Outpatient Medications  Medication Sig Dispense Refill  . pregabalin (LYRICA) 150 MG capsule Take 1 capsule (150 mg total) by mouth 3 (three) times daily. 90 capsule 2   No current facility-administered medications for this visit.       Objective:  BP 112/67 (BP Location: Left Arm, Patient Position: Sitting, Cuff Size: Normal)   Pulse 69   Temp (!) 97.1 F (36.2 C) (Oral)   Ht 5\' 5"  (1.651 m)   Wt 115 lb (52.2 kg)   LMP  (LMP Unknown)   BMI 19.14 kg/m  self reported vitals Gen: NAD, resting comfortably Lungs: nonlabored, normal respiratory rate  Skin: appears dry, no obvious rash Patient appears in distress due to continued issues with pain     Assessment and Plan   #Postherpetic neuralgia/herpes zoster S:patient continues to have significant pain in distribution of prior shingles. Reports pain is 9/10. This is despite 100mg  TID of lyrica. Not sleeping well due to pain- asks about a sleep aid A/P: Patient now at 2 months from diagnosis of herpes zoster-this may be postherpetic neuralgia-I am concerned she may have ongoing issues with level of current pain.  We will increase her Lyrica to 150 mg 3 times daily- at her age was somewhat hesitant to do so but she is in significant discomfort.  She asks about adding a sleep aid- with this high dose of Lyrica wanted to hold off on that for now.  She is going to update me in 2 weeks-with severity of pain we discussed possible neurology referral to see if they have any other thoughts for control of pain.  I have not frequently had to get to this high dose for folks with postherpetic neuralgia-once again she had declined Valtrex, has not been immunized against Shingles (declines most vaccines), and at her age-these are all risks for  significant pain from shingles  Future Appointments  Date Time Provider Quentin  10/14/2019  1:00 PM [facility]   Lab/Order associations:  Meds ordered this encounter  Medications  . pregabalin (LYRICA) 150 MG capsule    Sig: Take 1 capsule (150 mg total) by mouth 3 (three) times daily.    Dispense:  90 capsule    Refill:  2    Return precautions advised.  Garret Reddish, MD

## 2019-01-01 NOTE — Telephone Encounter (Signed)
Pt has been notified of update

## 2019-01-01 NOTE — Telephone Encounter (Signed)
Spoke to pt and she stated that she does not want to trail the Rx. Pt stated that she can barely make it to her mail box w/o feeling pain. Referral has been placed! No further action needed! Routing to PCP as Juluis Rainier

## 2019-01-02 ENCOUNTER — Telehealth (INDEPENDENT_AMBULATORY_CARE_PROVIDER_SITE_OTHER): Payer: Medicare Other | Admitting: Neurology

## 2019-01-02 ENCOUNTER — Other Ambulatory Visit: Payer: Self-pay

## 2019-01-02 ENCOUNTER — Encounter: Payer: Self-pay | Admitting: Neurology

## 2019-01-02 VITALS — Ht 65.0 in | Wt 118.0 lb

## 2019-01-02 DIAGNOSIS — B0229 Other postherpetic nervous system involvement: Secondary | ICD-10-CM | POA: Diagnosis not present

## 2019-01-02 MED ORDER — LIDOCAINE 5 % EX PTCH: 1.0000 | MEDICATED_PATCH | CUTANEOUS | Status: DC

## 2019-01-02 MED ORDER — DULOXETINE HCL 30 MG PO CPEP: 30.0000 mg | ORAL_CAPSULE | Freq: Every day | ORAL | 5 refills | Status: DC

## 2019-01-02 NOTE — Progress Notes (Signed)
New Patient Virtual Visit via Video Note The purpose of this virtual visit is to provide medical care while limiting exposure to the novel coronavirus.    Consent was obtained for video visit:  Yes.   Answered questions that patient had about telehealth interaction:  Yes.   I discussed the limitations, risks, security and privacy concerns of performing an evaluation and management service by telemedicine. I also discussed with the patient that there may be a patient responsible charge related to this service. The patient expressed understanding and agreed to proceed.  Pt location: Home Physician Location: office Name of referring provider:  Marin Olp, MD I connected with Lorin Mercy at patients initiation/request on 01/02/2019 at 12:50 PM EDT by video enabled telemedicine application and verified that I am speaking with the correct person using two identifiers. Pt MRN:  [id number] Pt DOB:  [birth year] Video Participants:  Lorin Mercy    History of Present Illness: Sheila Wood is a 84 y.o. left-handed Caucasian female who is otherwise very healthy presenting for evaluation of postherpetic neuralgia.   Starting in early March 2020, she developed blistering rash over right upper shoulder/chest area and neck. Once the rash started to improve, she began having burning pain, which is constant.  She gets very rare spells of no pain, which can be a few times per day, only lasting a few minutes.  Pain is worse with any tactile stimuli, such as her clothing.  She saw her primary care doctor and was diagnosed with shingles.  She did not wish to be treated with acyclovir.  Due to ongoing pain, she was initially started on gabapentin and stopped this due to no benefit.  More recently, she has been on a titrating dose of Lyrica and is up to 150 mg 3 times daily.  She reports feeling unsteady and complains of increased muscle aches on this dose.  She is less active due to fatigue.    Out-side paper  records, electronic medical record, and images have been reviewed where available and summarized as:  Lab Results  Component Value Date   TSH 0.79 09/07/2014   Lab Results  Component Value Date   ESRSEDRATE 21 12/03/2018    Past Medical History:  Diagnosis Date  . History of colonic polyps   . Hyperlipidemia   . Internal hemorrhoid   . Osteoporosis   . Shingles     Past Surgical History:  Procedure Laterality Date  . CHOLECYSTECTOMY     at time of colectomy  . COLONOSCOPY    . ESOPHAGOGASTRODUODENOSCOPY    . RIGHT COLECTOMY     adenomatous polyp     Medications:  Outpatient Encounter Medications as of 01/02/2019  Medication Sig  . pregabalin (LYRICA) 150 MG capsule Take 1 capsule (150 mg total) by mouth 3 (three) times daily.   No facility-administered encounter medications on file as of 01/02/2019.     Allergies:  Allergies  Allergen Reactions  . Penicillins Rash  . Aspirin     Family History: Family History  Problem Relation Age of Onset  . Colon cancer Brother   . Heart disease Mother        unclear history  . Diabetes Sister   . Lung cancer Father   . Kidney disease Son   . Prostate cancer Brother     Social History: Social History   Tobacco Use  . Smoking status: Never Smoker  . Smokeless tobacco: Never Used  Substance Use Topics  . Alcohol  use: Yes    Alcohol/week: 2.0 standard drinks    Types: 2 Standard drinks or equivalent per week    Comment: glass of wine- rarely uses  . Drug use: No   Social History   Social History Narrative   Widowed in Oct 2014. Lives alone. 3 children (daughter, 2 sons) all live close-sees regularly. 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   EYES: No visual changes or  eye pain ENT: No hearing changes.  No history of nose bleeds.   RESPIRATORY: No cough, wheezing and shortness of breath.   CARDIOVASCULAR: Negative for chest pain, and palpitations.   GI: Negative for abdominal discomfort, blood in stools or black stools.  No recent change in bowel habits.   GU:  No history of incontinence.   MUSCLOSKELETAL: No history of joint pain or swelling.  No myalgias.   SKIN: Negative for lesions, rash, and itching.   HEMATOLOGY/ONCOLOGY: Negative for prolonged bleeding, bruising easily, and swollen nodes.  No history of cancer.   ENDOCRINE: Negative for cold or heat intolerance, polydipsia or goiter.   PSYCH:  No depression or anxiety symptoms.   NEURO: As Above.   Vital Signs:  Ht 5\' 5"  (1.651 m)   Wt 118 lb (53.5 kg)   LMP  (LMP Unknown)   BMI 19.64 kg/m    General Medical Exam:  Well appearing, comfortable.  Nonlabored breathing.  No deformity or edema.  Well-healing rash noted on the right C4 dermatome.   Neurological Exam: MENTAL STATUS including orientation to time, place, person, recent and remote memory, attention span and concentration, language, and fund of knowledge is fair normal.  Speech is not dysarthric.  CRANIAL NERVES:  Normal conjugate, extra-ocular eye movements in all directions of gaze.  No ptosis.  Normal facial symmetry and movements.  Normal shoulder shrug and head rotation.  Tongue is midline.  MOTOR:  Antigravity in all extremities.  No abnormal movements.  No pronator drift.   SENS  IORY/REFLEXES:  Unable to assess  COORDINATION/GAIT: Normal finger to nose bilaterally.  Intact rapid alternating movements bilaterally.  Able to rise from a chair without using arms.  Gait narrow based and stable.   IMPRESSION/PLAN: Postherpetic neuralgia, with refractory pain.   - Previously tried:  Gabapentin  - Currently on Lyrica 150mg  TID but having side effects, so I will reduce to 150mg  BID  - Start Cymbalta 30mg  daily  - Start lidocaine  patch.  If this is cost prohibitive, I have asked her to start over-the-counter lidocaine ointment  - Will need updated EKG, prior to starting TCA (if needed)   Follow Up Instructions:  I discussed the assessment and treatment plan with the patient. The patient was provided an opportunity to ask questions and all were answered. The patient agreed with the plan and demonstrated an understanding of the instructions.   The patient was advised to call back or seek an in-person evaluation if the symptoms worsen or if the condition fails to improve as anticipated.  Return to clinic in in 2-3 weeks  Alda Berthold, DO

## 2019-01-14 ENCOUNTER — Encounter: Payer: Self-pay | Admitting: *Deleted

## 2019-01-16 ENCOUNTER — Other Ambulatory Visit: Payer: Self-pay

## 2019-01-16 ENCOUNTER — Telehealth (INDEPENDENT_AMBULATORY_CARE_PROVIDER_SITE_OTHER): Payer: Medicare Other | Admitting: Neurology

## 2019-01-16 DIAGNOSIS — B0229 Other postherpetic nervous system involvement: Secondary | ICD-10-CM

## 2019-01-16 DIAGNOSIS — Z79899 Other long term (current) drug therapy: Secondary | ICD-10-CM

## 2019-01-16 MED ORDER — LIDOCAINE 5 % EX PTCH: 1.0000 | MEDICATED_PATCH | CUTANEOUS | 4 refills | Status: DC

## 2019-01-16 NOTE — Progress Notes (Signed)
Dr. Posey Pronto- thanks for caring for her  Team- can we set her up for a nurse visit for EKG under high risk medication use on a day when both of you will be here (that way other CMA can room my other patient and we will not lose a patient slot) - also this could be level 1 visit which would be cheapest for patient.   Thanks, Garret Reddish

## 2019-01-16 NOTE — Progress Notes (Signed)
Meant to send to you as well Dr. Posey Pronto sorry!

## 2019-01-16 NOTE — Progress Notes (Signed)
   Virtual Visit via Video Note The purpose of this virtual visit is to provide medical care while limiting exposure to the novel coronavirus.    Consent was obtained for video visit:  Yes.   Answered questions that patient had about telehealth interaction:  Yes.   I discussed the limitations, risks, security and privacy concerns of performing an evaluation and management service by telemedicine. I also discussed with the patient that there may be a patient responsible charge related to this service. The patient expressed understanding and agreed to proceed.  Pt location: Home Physician Location: office Name of referring provider:  Marin Olp, MD I connected with Lorin Mercy at patients initiation/request on 01/16/2019 at  2:30 PM EDT by video enabled telemedicine application and verified that I am speaking with the correct person using two identifiers. Pt MRN:  [id number] Pt DOB:  1934-09-14 Video Participants:  Lorin Mercy   History of Present Illness: This is a 84 y.o. female returning for follow-up of postherpetic neuralgia. In March 2020, she developed herpes zoster over the right upper shoulder, chest, and neck.  Unfortunately, although her rash has significantly improved, she has severe postherpetic neuralgia over this area which is worse with tactile stimuli.  At her last visit, I started Cymbalta 30 mg, but she stopped it because of intolerability (flushing, grogginess).  She has not tried lidocaine.  She continues to take Lyrica 150mg  TID which does not provide any relief and instead she feels unsteady, fatigued, and lightheaded.   Observations/Objective:   Patient is awake, alert, and appears comfortable.  Oriented x 4.   Extraocular muscles are intact. No ptosis.  Face is symmetric.  Speech is not dysarthric. Tongue is midline. Antigravity in all extremities.    Assessment and Plan:  Post herpetic neuralgia with refractory pain  -Previously tried: Gabapentin  -Start  lidocaine 5% patch  -Reduce Lyrica 150 mg twice daily due to side effects at 150mg  TID and limited benefit.  If she develops worsening pain as the dose is reduced, then it indicates she was having some pain relief  - Check EKG in the event that tricyclic antidepressants need to be started.  I will contact her PCP to see if this can be performed in their office  Follow Up Instructions:   I discussed the assessment and treatment plan with the patient. The patient was provided an opportunity to ask questions and all were answered. The patient agreed with the plan and demonstrated an understanding of the instructions.   The patient was advised to call back or seek an in-person evaluation if the symptoms worsen or if the condition fails to improve as anticipated.  Follow-up in 1 month  Total time spent:  15 minutes     Alda Berthold, DO

## 2019-01-19 NOTE — Addendum Note (Signed)
Addended by: Gwenyth Ober R on: 01/19/2019 11:07 AM   Modules accepted: Orders

## 2019-01-19 NOTE — Progress Notes (Signed)
Noted! Pt has been scheduled for  Friday 01/23/2019.

## 2019-01-20 ENCOUNTER — Telehealth: Payer: Self-pay

## 2019-01-20 NOTE — Telephone Encounter (Signed)
Prior authorization request for Lidocaine 5% patches received and processed via cover my meds.  Waiting response.

## 2019-01-23 ENCOUNTER — Ambulatory Visit (INDEPENDENT_AMBULATORY_CARE_PROVIDER_SITE_OTHER): Payer: Medicare Other | Admitting: Family Medicine

## 2019-01-23 ENCOUNTER — Other Ambulatory Visit: Payer: Self-pay

## 2019-01-23 DIAGNOSIS — Z79899 Other long term (current) drug therapy: Secondary | ICD-10-CM | POA: Diagnosis not present

## 2019-01-23 NOTE — Patient Instructions (Signed)
There are no preventive care reminders to display for this patient.  Depression screen [facility] 2/9 09/26/2018 03/27/2017 12/29/2015  Decreased Interest 0 0 0  Down, Depressed, Hopeless 0 0 0  PHQ - 2 Score 0 0 0

## 2019-01-23 NOTE — Progress Notes (Signed)
EKG completed due to high risk medication use being considered to make sure no qt prolongation  EKG: sinus bradycardia with rate 58, normal axis, normal intervals including specifically qt interval, no hypertrophy, no st or t wave chages  Will let Dr. Posey Pronto know.

## 2019-02-24 ENCOUNTER — Encounter: Payer: Self-pay | Admitting: Neurology

## 2019-02-25 ENCOUNTER — Telehealth (INDEPENDENT_AMBULATORY_CARE_PROVIDER_SITE_OTHER): Payer: Medicare Other | Admitting: Neurology

## 2019-02-25 ENCOUNTER — Other Ambulatory Visit: Payer: Self-pay

## 2019-02-25 VITALS — Ht 65.0 in | Wt 118.0 lb

## 2019-02-25 DIAGNOSIS — Z79899 Other long term (current) drug therapy: Secondary | ICD-10-CM

## 2019-02-25 DIAGNOSIS — B0229 Other postherpetic nervous system involvement: Secondary | ICD-10-CM

## 2019-02-25 MED ORDER — NORTRIPTYLINE HCL 10 MG PO CAPS: ORAL_CAPSULE | ORAL | 3 refills | Status: DC

## 2019-02-25 NOTE — Progress Notes (Signed)
   Virtual Visit via Telephone Note The purpose of this virtual visit is to provide medical care while limiting exposure to the novel coronavirus.    Consent was obtained for telephone visit:  Yes.   Answered questions that patient had about telehealth interaction:  Yes.   I discussed the limitations, risks, security and privacy concerns of performing an evaluation and management service by telemedicine. I also discussed with the patient that there may be a patient responsible charge related to this service. The patient expressed understanding and agreed to proceed.  Pt location: Home Physician Location: office Name of referring provider:  Marin Olp, MD I connected with Sheila Wood at patients initiation/request on 02/25/2019 at 10:10 AM EDT by video enabled telemedicine application and verified that I am speaking with the correct person using two identifiers. Pt MRN:  [id number] Pt DOB:  14-Nov-1934 Video Participants:  Sheila Wood   History of Present Illness: This is a 84 y.o. female returning for follow-up of postherpetic neuralgia involving the right shoulder/chest (March 2020).  She continues to have severe burning pain over the right side of the neck, shoulder, and chest.  She is intolerant to medications so is taking Lyrica 150mg  TID with minimial relief and would like to stop the medication.  She did not tolerate Cymbalta (flushing, grogginess).  Assessment and Plan:  Post herpetic neuralgia with refractory pain and multiple medication intolerances  -Previously tried: Gabapentin, Cymbalta   -She would like to discontinue Lyrica - reduce by 150mg  each week, then stop  -Start nortriptyline 10mg  at bedtime for 4 weeks, then increase to 2 tablet at bedtime.  EKG reviewed, normal QTC  Follow Up Instructions:   I discussed the assessment and treatment plan with the patient. The patient was provided an opportunity to ask questions and all were answered. The patient agreed with the plan  and demonstrated an understanding of the instructions.   The patient was advised to call back or seek an in-person evaluation if the symptoms worsen or if the condition fails to improve as anticipated.  Total time spent:  12 min  Alda Berthold, DO

## 2019-03-11 DIAGNOSIS — H04123 Dry eye syndrome of bilateral lacrimal glands: Secondary | ICD-10-CM | POA: Diagnosis not present

## 2019-03-11 DIAGNOSIS — H401122 Primary open-angle glaucoma, left eye, moderate stage: Secondary | ICD-10-CM | POA: Diagnosis not present

## 2019-03-11 DIAGNOSIS — H35373 Puckering of macula, bilateral: Secondary | ICD-10-CM | POA: Diagnosis not present

## 2019-03-11 DIAGNOSIS — H401111 Primary open-angle glaucoma, right eye, mild stage: Secondary | ICD-10-CM | POA: Diagnosis not present

## 2019-03-21 ENCOUNTER — Other Ambulatory Visit: Payer: Self-pay | Admitting: Neurology

## 2019-03-23 NOTE — Telephone Encounter (Signed)
Requested Prescriptions   Pending Prescriptions Disp Refills  . nortriptyline (PAMELOR) 10 MG capsule [Pharmacy Med Name: NORTRIPTYLINE HCL 10 MG CAP] 180 capsule 2    Sig: TAKE 1 TABLET AT BEDTIME FOR 1 MONTH, THEN INCREASE 2 TABLETS.   Rx last filled:02/25/19 #60 3 REFILLS  Pt last seen: 02/25/19   Follow up appt scheduled:NONE  DENIED REQUEST TO SOON

## 2019-04-16 DIAGNOSIS — Z0120 Encounter for dental examination and cleaning without abnormal findings: Secondary | ICD-10-CM | POA: Diagnosis not present

## 2019-05-01 ENCOUNTER — Other Ambulatory Visit: Payer: Self-pay | Admitting: Family Medicine

## 2019-05-01 NOTE — Telephone Encounter (Signed)
Last OV 01/23/19 Last refill 12/31/18 #90/2 Next OV 10/14/19 w/ health coach  Forwarding to Dr. Yong Channel

## 2019-09-16 DIAGNOSIS — L814 Other melanin hyperpigmentation: Secondary | ICD-10-CM | POA: Diagnosis not present

## 2019-09-16 DIAGNOSIS — L728 Other follicular cysts of the skin and subcutaneous tissue: Secondary | ICD-10-CM | POA: Diagnosis not present

## 2019-09-16 DIAGNOSIS — L821 Other seborrheic keratosis: Secondary | ICD-10-CM | POA: Diagnosis not present

## 2019-09-16 DIAGNOSIS — D1801 Hemangioma of skin and subcutaneous tissue: Secondary | ICD-10-CM | POA: Diagnosis not present

## 2019-10-14 ENCOUNTER — Ambulatory Visit: Payer: Medicare Other

## 2019-10-15 ENCOUNTER — Other Ambulatory Visit: Payer: Self-pay

## 2019-10-15 ENCOUNTER — Ambulatory Visit (INDEPENDENT_AMBULATORY_CARE_PROVIDER_SITE_OTHER): Payer: Medicare Other

## 2019-10-15 VITALS — BP 106/64 | HR 78 | Temp 97.3°F | Ht 65.0 in | Wt 120.8 lb

## 2019-10-15 DIAGNOSIS — Z0000 Encounter for general adult medical examination without abnormal findings: Secondary | ICD-10-CM | POA: Diagnosis not present

## 2019-10-15 NOTE — Progress Notes (Signed)
Subjective:   Sheila Wood is a 85 y.o. female who presents for Medicare Annual (Subsequent) preventive examination.  Review of Systems:   Cardiac Risk Factors include: advanced age (>4men, >49 women);dyslipidemia    Objective:     Vitals: BP 106/64   Pulse 78   Temp (!) 97.3 F (36.3 C) (Temporal)   Ht 5\' 5"  (1.651 m)   Wt 120 lb 12.8 oz (54.8 kg)   LMP  (LMP Unknown)   SpO2 97%   BMI 20.10 kg/m   Body mass index is 20.1 kg/m.  Advanced Directives 10/15/2019 02/24/2019 01/14/2019 12/03/2018 09/26/2018 03/27/2017  Does Patient Have a Medical Advance Directive? No No No No No Yes  Does patient want to make changes to medical advance directive? Yes (MAU/Ambulatory/Procedural Areas - Information given) - - - - -  Would patient like information on creating a medical advance directive? - - - - No - Patient declined -    Tobacco Social History   Tobacco Use  Smoking Status Never Smoker  Smokeless Tobacco Never Used     Counseling given: Not Answered   Clinical Intake:  Pre-visit preparation completed: Yes  Pain : No/denies pain  Diabetes: No  How often do you need to have someone help you when you read instructions, pamphlets, or other written materials from your doctor or pharmacy?: 1 - Never  Interpreter Needed?: No  Information entered by :: Denman George LPN  Past Medical History:  Diagnosis Date  . History of colonic polyps   . Hyperlipidemia   . Internal hemorrhoid   . Osteoporosis   . Shingles    Past Surgical History:  Procedure Laterality Date  . CHOLECYSTECTOMY     at time of colectomy  . COLONOSCOPY    . ESOPHAGOGASTRODUODENOSCOPY    . RIGHT COLECTOMY     adenomatous polyp   Family History  Problem Relation Age of Onset  . Colon cancer Brother   . Heart disease Mother        unclear history  . Diabetes Sister   . Lung cancer Father   . Kidney disease Son   . Prostate cancer Brother    Social History   Socioeconomic History  . Marital  status: Married    Spouse name: Not on file  . Number of children: 3  . Years of education: Not on file  . Highest education level: Not on file  Occupational History  . Occupation: Retired   Tobacco Use  . Smoking status: Never Smoker  . Smokeless tobacco: Never Used  Substance and Sexual Activity  . Alcohol use: Yes    Alcohol/week: 2.0 standard drinks    Types: 2 Standard drinks or equivalent per week    Comment: glass of wine- rarely uses  . Drug use: No  . Sexual activity: Not Currently  Other Topics Concern  . Not on file  Social History Narrative   Widowed in Oct 2014. 1 Son lives with her whom is on dialysis; 3 children (daughter, 2 sons) all live close-sees regularly. 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 Difficulty of Paying Living Expenses: Not on file  Food Insecurity:   . Worried About Running  Out of Food in the Last Year: Not on file  . Ran Out of Food in the Last Year: Not on file  Transportation Needs:   . Lack of Transportation (Medical): Not on file  . Lack of Transportation (Non-Medical): Not on file  Physical Activity:   . Days of Exercise per Week: Not on file  . Minutes of Exercise per Session: Not on file  Stress:   . Feeling of Stress : Not on file  Social Connections:   . Frequency of Communication with Friends and Family: Not on file  . Frequency of Social Gatherings with Friends and Family: Not on file  . Attends Religious Services: Not on file  . Active Member of Clubs or Organizations: Not on file  . Attends Archivist Meetings: Not on file  . Marital Status: Not on file    Outpatient Encounter Medications as of 10/15/2019  Medication Sig  . Acetaminophen (TYLENOL 8 HOUR  ARTHRITIS PAIN PO) Take by mouth as needed.  . lidocaine (LIDODERM) 5 % Place 1 patch onto the skin daily. Remove & Discard patch within 12 hours or as directed by MD (Patient not taking: Reported on 10/15/2019)  . nortriptyline (PAMELOR) 10 MG capsule Take 1 tablet at bedtime for 1 month, then increase 2 tablets. (Patient not taking: Reported on 10/15/2019)  . pregabalin (LYRICA) 150 MG capsule TAKE 1 CAPSULE (150 MG TOTAL) BY MOUTH 3 (THREE) TIMES DAILY. (Patient not taking: Reported on 10/15/2019)   No facility-administered encounter medications on file as of 10/15/2019.    Activities of Daily Living In your present state of health, do you have any difficulty performing the following activities: 10/15/2019  Hearing? N  Vision? N  Difficulty concentrating or making decisions? N  Walking or climbing stairs? N  Dressing or bathing? N  Doing errands, shopping? N  Preparing Food and eating ? N  Using the Toilet? N  In the past six months, have you accidently leaked urine? N  Do you have problems with loss of bowel control? N  Managing your Medications? N  Managing your Finances? N  Housekeeping or managing your Housekeeping? N  Some recent data might be hidden    Patient Care Team: Marin Olp, MD as PCP - General (Family Medicine) Alda Berthold, DO as Consulting Physician (Neurology)    Assessment:   This is a routine wellness examination for [place].  Exercise Activities and Dietary recommendations Current Exercise Habits: The patient does not participate in regular exercise at present  Goals    . patient      Go on vacation!         Fall Risk Fall Risk  10/15/2019 02/24/2019 01/14/2019 09/26/2018 03/27/2017  Falls in the past year? 0 0 0 0 No  Number falls in past yr: 0 - 1 - -  Injury with Fall? 0 - 0 - -  Follow up Falls evaluation completed;Education provided;Falls prevention discussed - - - -   Is the patient's home free of loose throw rugs in walkways, pet beds,  electrical cords, etc?   yes      Grab bars in the bathroom? yes      Handrails on the stairs?   yes      Adequate lighting?   yes  Timed Get Up and Go performed: completed and within normal timeframe; no gait abnormalities noted   Depression Screen PHQ 2/9 Scores 10/15/2019 09/26/2018 03/27/2017 12/29/2015  PHQ - 2 Score 0 0 0  0     Cognitive Function MMSE - Mini Mental State Exam 09/26/2018  Orientation to time 5  Orientation to Place 5  Registration 3  Attention/ Calculation 5  Recall 3  Language- name 2 objects 2  Language- repeat 1  Language- follow 3 step command 3  Language- read & follow direction 1  Write a sentence 1  Copy design 1  Total score 30     6CIT Screen 10/15/2019  What Year? 0 points  What month? 0 points  What time? 0 points  Count back from 20 0 points  Months in reverse 0 points  Repeat phrase 0 points  Total Score 0    Immunization History  Administered Date(s) Administered  . Influenza Whole 06/05/2010  . Influenza, High Dose Seasonal PF 07/01/2017    Qualifies for Shingles Vaccine?Discussed and patient will check with pharmacy for coverage.  Patient education handout provided   Screening Tests Health Maintenance  Topic Date Due  . INFLUENZA VACCINE  03/21/2019  . DEXA SCAN  09/07/2024 (Originally 08/23/2001)  . TETANUS/TDAP  09/07/2024 (Originally 08/24/1953)  . PNA vac Low Risk Adult (1 of 2 - PCV13) 03/24/2029 (Originally 08/23/2001)    Cancer Screenings: Lung: Low Dose CT Chest recommended if Age 80-80 years, 30 pack-year currently smoking OR have quit w/in 15years. Patient does not qualify. Breast:  Up to date on Mammogram? Yes   Up to date of Bone Density/Dexa? Yes Colorectal: No longer indicated     Plan:  I have personally reviewed and addressed the Medicare Annual Wellness questionnaire and have noted the following in the patient's chart:  A. Medical and social history B. Use of alcohol, tobacco or illicit drugs  C. Current  medications and supplements D. Functional ability and status E.  Nutritional status F.  Physical activity G. Advance directives H. List of other physicians I.  Hospitalizations, surgeries, and ER visits in previous 12 months J.  [place] such as hearing and vision if needed, cognitive and depression L. Referrals, records requested, and appointments- none   In addition, I have reviewed and discussed with patient certain preventive protocols, quality metrics, and best practice recommendations. A written personalized care plan for preventive services as well as general preventive health recommendations were provided to patient.   Signed,  Denman George, LPN  Nurse Health Advisor   Nurse Notes: Patient continues to have problems from shingles.  She is no longer taking any medications.  Discussed stress management and immune system support.

## 2019-10-15 NOTE — Patient Instructions (Addendum)
Sheila Wood , Thank you for taking time to come for your Medicare Wellness Visit. I appreciate your ongoing commitment to your health goals. Please review the following plan we discussed and let me know if I can assist you in the future.   Screening recommendations/referrals: Colorectal Screening: No longer indicated  Mammogram: No longer indicated  Bone Density: No longer indicated   Vision and Dental Exams: Recommended annual ophthalmology exams for early detection of glaucoma and other disorders of the eye Recommended annual dental exams for proper oral hygiene  Vaccinations: (see handouts) Influenza vaccine: recommended  Pneumococcal vaccine: recommended  Tdap vaccine: recommended; Please call your insurance company to determine your out of pocket expense. You may also receive this vaccine at your local pharmacy or Health Dept. Shingles vaccine: Please call your insurance company to determine your out of pocket expense for the Shingrix vaccine. You may receive this vaccine at your local pharmacy.  Advanced directives: Please bring a copy of your POA (Power of Attorney) and/or Living Will to your next appointment.  Goals: Recommend to drink at least 6-8 8oz glasses of water per day and consume a balanced diet rich in fresh fruits and vegetables.   Next appointment: Please schedule your Annual Wellness Visit with your Nurse Health Advisor in one year.  Please schedule a physical with Dr. Yong Channel for the next availability   Concerns:  Try Echinacea for Shingles which you can find over the counter in the vitamin section at the pharmacy   Preventive Care 65 Years and Older, Female Preventive care refers to lifestyle choices and visits with your health care provider that can promote health and wellness. What does preventive care include?  A yearly physical exam. This is also called an annual well check.  Dental exams once or twice a year.  Routine eye exams. Ask your health care  provider how often you should have your eyes checked.  Personal lifestyle choices, including:  Daily care of your teeth and gums.  Regular physical activity.  Eating a healthy diet.  Avoiding tobacco and drug use.  Limiting alcohol use.  Practicing safe sex.  Taking low-dose aspirin every day if recommended by your health care provider.  Taking vitamin and mineral supplements as recommended by your health care provider. What happens during an annual well check? The services and screenings done by your health care provider during your annual well check will depend on your age, overall health, lifestyle risk factors, and family history of disease. Counseling  Your health care provider may ask you questions about your:  Alcohol use.  Tobacco use.  Drug use.  Emotional well-being.  Home and relationship well-being.  Sexual activity.  Eating habits.  History of falls.  Memory and ability to understand (cognition).  Work and work Statistician.  Reproductive health. Screening  You may have the following tests or measurements:  Height, weight, and BMI.  Blood pressure.  Lipid and cholesterol levels. These may be checked every 5 years, or more frequently if you are over 66 years old.  Skin check.  Lung cancer screening. You may have this screening every year starting at age 53 if you have a 30-pack-year history of smoking and currently smoke or have quit within the past 15 years.  Fecal occult blood test (FOBT) of the stool. You may have this test every year starting at age 40.  Flexible sigmoidoscopy or colonoscopy. You may have a sigmoidoscopy every 5 years or a colonoscopy every 10 years starting at age  50.  Hepatitis C blood test.  Hepatitis B blood test.  Sexually transmitted disease (STD) testing.  Diabetes screening. This is done by checking your blood sugar (glucose) after you have not eaten for a while (fasting). You may have this done every 1-3  years.  Bone density scan. This is done to screen for osteoporosis. You may have this done starting at age 40.  Mammogram. This may be done every 1-2 years. Talk to your health care provider about how often you should have regular mammograms. Talk with your health care provider about your test results, treatment options, and if necessary, the need for more tests. Vaccines  Your health care provider may recommend certain vaccines, such as:  Influenza vaccine. This is recommended every year.  Tetanus, diphtheria, and acellular pertussis (Tdap, Td) vaccine. You may need a Td booster every 10 years.  Zoster vaccine. You may need this after age 34.  Pneumococcal 13-valent conjugate (PCV13) vaccine. One dose is recommended after age 46.  Pneumococcal polysaccharide (PPSV23) vaccine. One dose is recommended after age 51. Talk to your health care provider about which screenings and vaccines you need and how often you need them. This information is not intended to replace advice given to you by your health care provider. Make sure you discuss any questions you have with your health care provider. Document Released: 09/02/2015 Document Revised: 04/25/2016 Document Reviewed: 06/07/2015 Elsevier Interactive Patient Education  2017 [employer] Prevention in the Home Falls can cause injuries. They can happen to people of all ages. There are many things you can do to make your home safe and to help prevent falls. What can I do on the outside of my home?  Regularly fix the edges of walkways and driveways and fix any cracks.  Remove anything that might make you trip as you walk through a door, such as a raised step or threshold.  Trim any bushes or trees on the path to your home.  Use bright outdoor lighting.  Clear any walking paths of anything that might make someone trip, such as rocks or tools.  Regularly check to see if handrails are loose or broken. Make sure that both sides of any  steps have handrails.  Any raised decks and porches should have guardrails on the edges.  Have any leaves, snow, or ice cleared regularly.  Use sand or salt on walking paths during winter.  Clean up any spills in your garage right away. This includes oil or grease spills. What can I do in the bathroom?  Use night lights.  Install grab bars by the toilet and in the tub and shower. Do not use towel bars as grab bars.  Use non-skid mats or decals in the tub or shower.  If you need to sit down in the shower, use a plastic, non-slip stool.  Keep the floor dry. Clean up any water that spills on the floor as soon as it happens.  Remove soap buildup in the tub or shower regularly.  Attach bath mats securely with double-sided non-slip rug tape.  Do not have throw rugs and other things on the floor that can make you trip. What can I do in the bedroom?  Use night lights.  Make sure that you have a light by your bed that is easy to reach.  Do not use any sheets or blankets that are too big for your bed. They should not hang down onto the floor.  Have a firm chair that  has side arms. You can use this for support while you get dressed.  Do not have throw rugs and other things on the floor that can make you trip. What can I do in the kitchen?  Clean up any spills right away.  Avoid walking on wet floors.  Keep items that you use a lot in easy-to-reach places.  If you need to reach something above you, use a strong step stool that has a grab bar.  Keep electrical cords out of the way.  Do not use floor polish or wax that makes floors slippery. If you must use wax, use non-skid floor wax.  Do not have throw rugs and other things on the floor that can make you trip. What can I do with my stairs?  Do not leave any items on the stairs.  Make sure that there are handrails on both sides of the stairs and use them. Fix handrails that are broken or loose. Make sure that handrails are  as long as the stairways.  Check any carpeting to make sure that it is firmly attached to the stairs. Fix any carpet that is loose or worn.  Avoid having throw rugs at the top or bottom of the stairs. If you do have throw rugs, attach them to the floor with carpet tape.  Make sure that you have a light switch at the top of the stairs and the bottom of the stairs. If you do not have them, ask someone to add them for you. What else can I do to help prevent falls?  Wear shoes that:  Do not have high heels.  Have rubber bottoms.  Are comfortable and fit you well.  Are closed at the toe. Do not wear sandals.  If you use a stepladder:  Make sure that it is fully opened. Do not climb a closed stepladder.  Make sure that both sides of the stepladder are locked into place.  Ask someone to hold it for you, if possible.  Clearly mark and make sure that you can see:  Any grab bars or handrails.  First and last steps.  Where the edge of each step is.  Use tools that help you move around (mobility aids) if they are needed. These include:  Canes.  Walkers.  Scooters.  Crutches.  Turn on the lights when you go into a dark area. Replace any light bulbs as soon as they burn out.  Set up your furniture so you have a clear path. Avoid moving your furniture around.  If any of your floors are uneven, fix them.  If there are any pets around you, be aware of where they are.  Review your medicines with your doctor. Some medicines can make you feel dizzy. This can increase your chance of falling. Ask your doctor what other things that you can do to help prevent falls. This information is not intended to replace advice given to you by your health care provider. Make sure you discuss any questions you have with your health care provider. Document Released: 06/02/2009 Document Revised: 01/12/2016 Document Reviewed: 09/10/2014 Elsevier Interactive Patient Education  2017 [employer].

## 2019-12-24 ENCOUNTER — Other Ambulatory Visit: Payer: Self-pay | Admitting: Family Medicine

## 2019-12-24 ENCOUNTER — Encounter: Payer: Self-pay | Admitting: Family Medicine

## 2019-12-24 ENCOUNTER — Ambulatory Visit (INDEPENDENT_AMBULATORY_CARE_PROVIDER_SITE_OTHER): Payer: Medicare Other | Admitting: Family Medicine

## 2019-12-24 ENCOUNTER — Other Ambulatory Visit: Payer: Self-pay

## 2019-12-24 VITALS — BP 120/64 | HR 65 | Temp 97.5°F | Ht 65.0 in | Wt 118.6 lb

## 2019-12-24 DIAGNOSIS — Z0000 Encounter for general adult medical examination without abnormal findings: Secondary | ICD-10-CM

## 2019-12-24 DIAGNOSIS — Z86010 Personal history of colonic polyps: Secondary | ICD-10-CM

## 2019-12-24 DIAGNOSIS — E785 Hyperlipidemia, unspecified: Secondary | ICD-10-CM | POA: Diagnosis not present

## 2019-12-24 DIAGNOSIS — B0229 Other postherpetic nervous system involvement: Secondary | ICD-10-CM | POA: Insufficient documentation

## 2019-12-24 DIAGNOSIS — M810 Age-related osteoporosis without current pathological fracture: Secondary | ICD-10-CM

## 2019-12-24 LAB — CBC WITH DIFFERENTIAL/PLATELET
Basophils Absolute: 0 10*3/uL (ref 0.0–0.1)
Basophils Relative: 0.7 % (ref 0.0–3.0)
Eosinophils Absolute: 0 10*3/uL (ref 0.0–0.7)
Eosinophils Relative: 0.8 % (ref 0.0–5.0)
HCT: 41.7 % (ref 36.0–46.0)
Hemoglobin: 13.8 g/dL (ref 12.0–15.0)
Lymphocytes Relative: 33.7 % (ref 12.0–46.0)
Lymphs Abs: 1.9 10*3/uL (ref 0.7–4.0)
MCHC: 33.2 g/dL (ref 30.0–36.0)
MCV: 93.4 fl (ref 78.0–100.0)
Monocytes Absolute: 0.5 10*3/uL (ref 0.1–1.0)
Monocytes Relative: 9.6 % (ref 3.0–12.0)
Neutro Abs: 3.1 10*3/uL (ref 1.4–7.7)
Neutrophils Relative %: 55.2 % (ref 43.0–77.0)
Platelets: 219 10*3/uL (ref 150.0–400.0)
RBC: 4.46 Mil/uL (ref 3.87–5.11)
RDW: 13.6 % (ref 11.5–15.5)
WBC: 5.6 10*3/uL (ref 4.0–10.5)

## 2019-12-24 LAB — COMPREHENSIVE METABOLIC PANEL
ALT: 10 U/L (ref 0–35)
AST: 14 U/L (ref 0–37)
Albumin: 4.3 g/dL (ref 3.5–5.2)
Alkaline Phosphatase: 105 U/L (ref 39–117)
BUN: 17 mg/dL (ref 6–23)
CO2: 28 mEq/L (ref 19–32)
Calcium: 9.4 mg/dL (ref 8.4–10.5)
Chloride: 103 mEq/L (ref 96–112)
Creatinine, Ser: 0.71 mg/dL (ref 0.40–1.20)
GFR: 78.55 mL/min (ref >60.00)
Glucose, Bld: 91 mg/dL (ref 70–99)
Potassium: 4.7 mEq/L (ref 3.5–5.1)
Sodium: 138 mEq/L (ref 135–145)
Total Bilirubin: 0.7 mg/dL (ref 0.2–1.2)
Total Protein: 6.8 g/dL (ref 6.0–8.3)

## 2019-12-24 LAB — LIPID PANEL
Cholesterol: 242 mg/dL — ABNORMAL HIGH (ref 0–200)
HDL: 83.5 mg/dL (ref >39.00)
LDL Cholesterol: 131 mg/dL — ABNORMAL HIGH (ref 0–99)
NonHDL: 158.78
Total CHOL/HDL Ratio: 3
Triglycerides: 139 mg/dL (ref 0.0–149.0)
VLDL: 27.8 mg/dL (ref 0.0–40.0)

## 2019-12-24 LAB — VITAMIN D 25 HYDROXY (VIT D DEFICIENCY, FRACTURES): VITD: 14.32 ng/mL — ABNORMAL LOW (ref 30.00–100.00)

## 2019-12-24 MED ORDER — VITAMIN D (ERGOCALCIFEROL) 1.25 MG (50000 UNIT) PO CAPS: 50000.0000 [IU] | ORAL_CAPSULE | ORAL | 1 refills | Status: DC

## 2019-12-24 MED ORDER — TRAMADOL HCL 50 MG PO TABS: 25.0000 mg | ORAL_TABLET | Freq: Two times a day (BID) | ORAL | 2 refills | Status: DC | PRN

## 2019-12-24 NOTE — Assessment & Plan Note (Addendum)
#   Postherpetic neuralgia S:Pain can still get up to 8/10. Right now about 6/10. If really calm/quiet and concentrating on something pain can get lower.   -It appears patient eventually converted back to Lyrica.  lyrica caused imbalance - nortriptyline caused heartburn - capsaicin has not helped  - Uses tylenol arthritis and helps slightly Reviewed note from from February 25, 2019 visit with Dr. Posey Pronto  "Assessment and Plan:  Post herpetic neuralgia with refractory pain and multiple medication intolerances              -Previously tried: Gabapentin, Cymbalta               -She would like to discontinue Lyrica - reduce by 150mg  each week, then stop              -Start nortriptyline 10mg  at bedtime for 4 weeks, then increase to 2 tablet at bedtime.  EKG reviewed, normal QTC"  A/P: 85 year old female with shingles last year-ongoing postherpetic neuralgia- poor control. She has tried gabapentin, Cymbalta, Lyrica, nortriptyline, capsaicin, Tylenol arthritis with various issues with each of these. Tylenol arthritis has been the most helpful with least side effects. We opted to try a low-dose of tramadol if pain gets above 5 out of 10. I am willing to refill this for up to 6 months but we would need to check in in 6 months as its a controlled substance

## 2019-12-24 NOTE — Assessment & Plan Note (Addendum)
S: Patient with known osteoporosis from [place] reports. Had never been on fosamax.  She states she did not tolerate calcium and vitamin D. She is also not interested in repeating bone density. She knows she has increased fracture risk if she were to have a fall. A/P: Discussed doing bone density test today-patient declined. She is aware of risks with falls and her increased fracture risk.  -She agrees to restart calcium and vitamin D. Ideally she would get 1200 mg of calcium per day in combination between diet and supplement as well as at least 800 units of vitamin D. I will check a vitamin D level with her labs today

## 2019-12-24 NOTE — Patient Instructions (Addendum)
Recommended follow up:  6 months follow up if need refill on pain medicine or definitely 1 year for physical  Please stop by lab before you go If you have mychart- we will send your results within 3 business days of [place] receiving them.  If you do not have mychart- we will call you about results within 5 business days of [place] receiving them.   Want you to get your covid 19 vaccine [email or web address]  Also due for tetanus shot at your pharmacy- prioritize covid vaccine first  She agrees to restart calcium and vitamin D. Ideally she would get 1200 mg of calcium per day in combination between diet and supplement as well as at least 800 units of vitamin D. I will check a vitamin D level with her labs today - team please give a calcium in food handout from up to date  Try tramadol for pain. Do not drive for 8 hours after taking. Can use if pain >5/10 .

## 2019-12-24 NOTE — Progress Notes (Signed)
Phone: [phone]   Subjective:  Patient presents today for their annual physical. Chief complaint-noted.   See problem oriented charting- ROS- full  review of systems was completed and negative except for:  Pain from posteherpetic neuralgia. Occasional slight watery eyes  The following were reviewed and entered/updated in epic: Past Medical History:  Diagnosis Date  . History of colonic polyps   . Hyperlipidemia   . Internal hemorrhoid   . Osteoporosis   . Shingles    Patient Active Problem List   Diagnosis Date Noted  . Hyperlipidemia 05/05/2007    Priority: Medium  . Osteoporosis 05/05/2007    Priority: Medium  . Glaucoma 03/12/2016    Priority: Low  . History of colonic polyps 05/05/2007    Priority: Low   Past Surgical History:  Procedure Laterality Date  . CHOLECYSTECTOMY     at time of colectomy  . COLONOSCOPY    . ESOPHAGOGASTRODUODENOSCOPY    . RIGHT COLECTOMY     adenomatous polyp    Family History  Problem Relation Age of Onset  . Colon cancer Brother   . Heart disease Mother        unclear history  . Diabetes Sister   . Lung cancer Father   . Kidney disease Son   . Prostate cancer Brother     Medications- reviewed and updated Current Outpatient Medications  Medication Sig Dispense Refill  . Acetaminophen (TYLENOL 8 HOUR ARTHRITIS PAIN PO) Take by mouth as needed.    . lidocaine (LIDODERM) 5 % Place 1 patch onto the skin daily. Remove & Discard patch within 12 hours or as directed by MD (Patient not taking: Reported on 10/15/2019) 30 patch 4  . nortriptyline (PAMELOR) 10 MG capsule Take 1 tablet at bedtime for 1 month, then increase 2 tablets. (Patient not taking: Reported on 10/15/2019) 60 capsule 3  . pregabalin (LYRICA) 150 MG capsule TAKE 1 CAPSULE (150 MG TOTAL) BY MOUTH 3 (THREE) TIMES DAILY. (Patient not taking: Reported on 10/15/2019) 90 capsule 2   No current facility-administered medications for this visit.    Allergies-reviewed and  updated Allergies  Allergen Reactions  . Penicillins Rash  . Aspirin     Social History   Social History Narrative   Widowed in Oct 2014. 1 Son lives with her whom is on dialysis; 3 children (daughter, 2 sons) all live close-sees regularly. 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 Oropharynx normal Neck: no thyromegaly CV: RRR no murmurs rubs or gallops Lungs: CTAB no crackles, wheeze, rhonchi Abdomen: soft/nontender/nondistended/normal bowel sounds. No rebound or guarding.  Ext: no edema Skin: warm, dry, scarring from postherpetic neuralgia Neuro: grossly normal, moves all extremities, PERRLA   Assessment and Plan   85 y.o. female presenting for annual physical.  Health Maintenance counseling: 1. Anticipatory guidance: Patient counseled regarding regular dental exams -q6 months, eye exams - yearly,  avoiding smoking and second hand smoke , limiting  alcohol to 1 beverage per day- worsens postherpetic neuralgia so avoiding .   2. Risk factor reduction:  Advised patient of need for regular exercise and diet rich and fruits and vegetables to reduce risk of heart attack and stroke. Exercise- active around her house- doesn't feel up to taking walks anymore-encouraged to restart. Diet- stable weight at 118- encouraged no weight loss.  Wt Readings from Last 3 Encounters:  12/24/19 118 lb 9.6 oz (53.8 kg)  10/15/19 120 lb 12.8 oz (54.8 kg)  02/24/19 118 lb (53.5 kg)  3. Immunizations/screenings/ancillary studies- discussed covid 19 vaccination - she is  going to get this.  Discussed shingles vaccination - 10/2018 had shingles and still dealing with post herpetic neuralgia.  Discussed TD vaccinations .  Immunization History  Administered Date(s) Administered  . Influenza Whole 06/05/2010  . Influenza, High Dose Seasonal PF 07/01/2017  4. Cervical cancer screening-  past age based screening recommendations 5. Breast cancer screening- occasional self exams.  past age based screening recommendations for mammogram 6. Colon cancer screening -  10/2013 advised 5 year follow up due to family history and right colectomy 2007 for large adenomatous polyp . She declines today unless she were to have anemia on labs. I told her I would at least like to have GIs opinion due to history of March follow-up-she understands there is a risk of cancer being missed almost to hold off at this time 7. Skin cancer screening- recent visit reassuring. advised regular sunscreen use. Denies worrisome, changing, or new skin lesions.  8. Birth control/STD check- widowed- not sexually active at present before covid had been dating- declines STD screening 9. Osteoporosis screening at 26-  patient has declined in past here and declines here -Never smoker  Status of chronic or acute concerns   #hyperlipidemia S: Medication:none  Lab Results  Component Value Date   CHOL 226 (H) 10/09/2018   HDL 82.90 10/09/2018   LDLCALC 109 (H) 10/09/2018   TRIG 174.0 (H) 10/09/2018   CHOLHDL 3 10/09/2018   A/P: no indication for primary prevention medication at her age. We will update bloodwork  #Glaucoma-follows up with Dr. Bing Plume  Recommended follow up:  6 months follow up or definitely 1 year for physical  Lab/Order associations: fasting No diagnosis found.  No orders of the defined types were placed in this encounter.   Return precautions advised.  Garret Reddish, MD

## 2019-12-24 NOTE — Progress Notes (Addendum)
Phone [phone] In person visit   Subjective:   Female Masis is a 85 y.o. year old very pleasant female patient who presents for/with See problem oriented charting  This visit occurred during the SARS-CoV-2 public health emergency.  Safety protocols were in place, including screening questions prior to the visit, additional usage of staff PPE, and extensive cleaning of exam room while observing appropriate contact time as indicated for disinfecting solutions.   Past Medical History-  Patient Active Problem List   Diagnosis Date Noted  . Post herpetic neuralgia 12/24/2019    Priority: Medium  . Hyperlipidemia 05/05/2007    Priority: Medium  . Osteoporosis 05/05/2007    Priority: Medium  . Glaucoma 03/12/2016    Priority: Low  . History of colonic polyps 05/05/2007    Priority: Low    Medications- reviewed and updated Current Outpatient Medications  Medication Sig Dispense Refill  . Acetaminophen (TYLENOL 8 HOUR ARTHRITIS PAIN PO) Take by mouth as needed.    . traMADol (ULTRAM) 50 MG tablet Take 0.5-1 tablets (25-50 mg total) by mouth 2 (two) times daily as needed for moderate pain or severe pain (>5/10 postherpetic neuralgia pain. chronic pain. Do not drive 8 hours after taking.). 30 tablet 2   No current facility-administered medications for this visit.     Objective:  BP 120/64   Pulse 65   Temp (!) 97.5 F (36.4 C)   Ht 5\' 5"  (1.651 m)   Wt 118 lb 9.6 oz (53.8 kg)   LMP  (LMP Unknown)   SpO2 97%   BMI 19.74 kg/m  Gen: NAD, resting comfortably Scarring on right chest and shoulder from postherpetic neuralgia- she is sensitive in this area to touch    Assessment and Plan  Post herpetic neuralgia # Postherpetic neuralgia S:Pain can still get up to 8/10. Right now about 6/10. If really calm/quiet and concentrating on something pain can get lower.   -It appears patient eventually converted back to Lyrica.  lyrica caused imbalance - nortriptyline caused heartburn -  capsaicin has not helped  - Uses tylenol arthritis and helps slightly Reviewed note from from February 25, 2019 visit with Dr. Posey Pronto  "Assessment and Plan:  Post herpetic neuralgia with refractory pain and multiple medication intolerances              -Previously tried: Gabapentin, Cymbalta               -She would like to discontinue Lyrica - reduce by 150mg  each week, then stop              -Start nortriptyline 10mg  at bedtime for 4 weeks, then increase to 2 tablet at bedtime.  EKG reviewed, normal QTC"  A/P: 85 year old female with shingles last year-ongoing postherpetic neuralgia- poor control. She has tried gabapentin, Cymbalta, Lyrica, nortriptyline, capsaicin, Tylenol arthritis with various issues with each of these. Tylenol arthritis has been the most helpful with least side effects. We opted to try a low-dose of tramadol if pain gets above 5 out of 10. I am willing to refill this for up to 6 months but we would need to check in in 6 months as its a controlled substance  Osteoporosis S: Patient with known osteoporosis from [place] reports. Had never been on fosamax.  She states she did not tolerate calcium and vitamin D. She is also not interested in repeating bone density. She knows she has increased fracture risk if she were to have a fall. A/P: Discussed doing bone  density test today-patient declined. She is aware of risks with falls and her increased fracture risk.  -She agrees to restart calcium and vitamin D. Ideally she would get 1200 mg of calcium per day in combination between diet and supplement as well as at least 800 units of vitamin D. I will check a vitamin D level with her labs today   Recommended follow up: 6 months  Lab/Order associations:   ICD-10-CM   2. Post herpetic neuralgia  B02.29   3. Age-related osteoporosis without current pathological fracture  M81.0 VITAMIN D 25 Hydroxy (Vit-D Deficiency, Fractures)   Meds ordered this encounter  Medications  . traMADol  (ULTRAM) 50 MG tablet    Sig: Take 0.5-1 tablets (25-50 mg total) by mouth 2 (two) times daily as needed for moderate pain or severe pain (>5/10 postherpetic neuralgia pain. chronic pain. Do not drive 8 hours after taking.).    Dispense:  30 tablet    Refill:  2   Return precautions advised.  Garret Reddish, MD

## 2020-01-30 IMAGING — DX DG SHOULDER 2+V*R*
3 series · 3 of 3 positions shown · non-contrast
Comparison: None.

CLINICAL DATA: Acute right shoulder pain.

EXAM:
RIGHT SHOULDER - 2+ VIEW

[shoulder grashey ap]
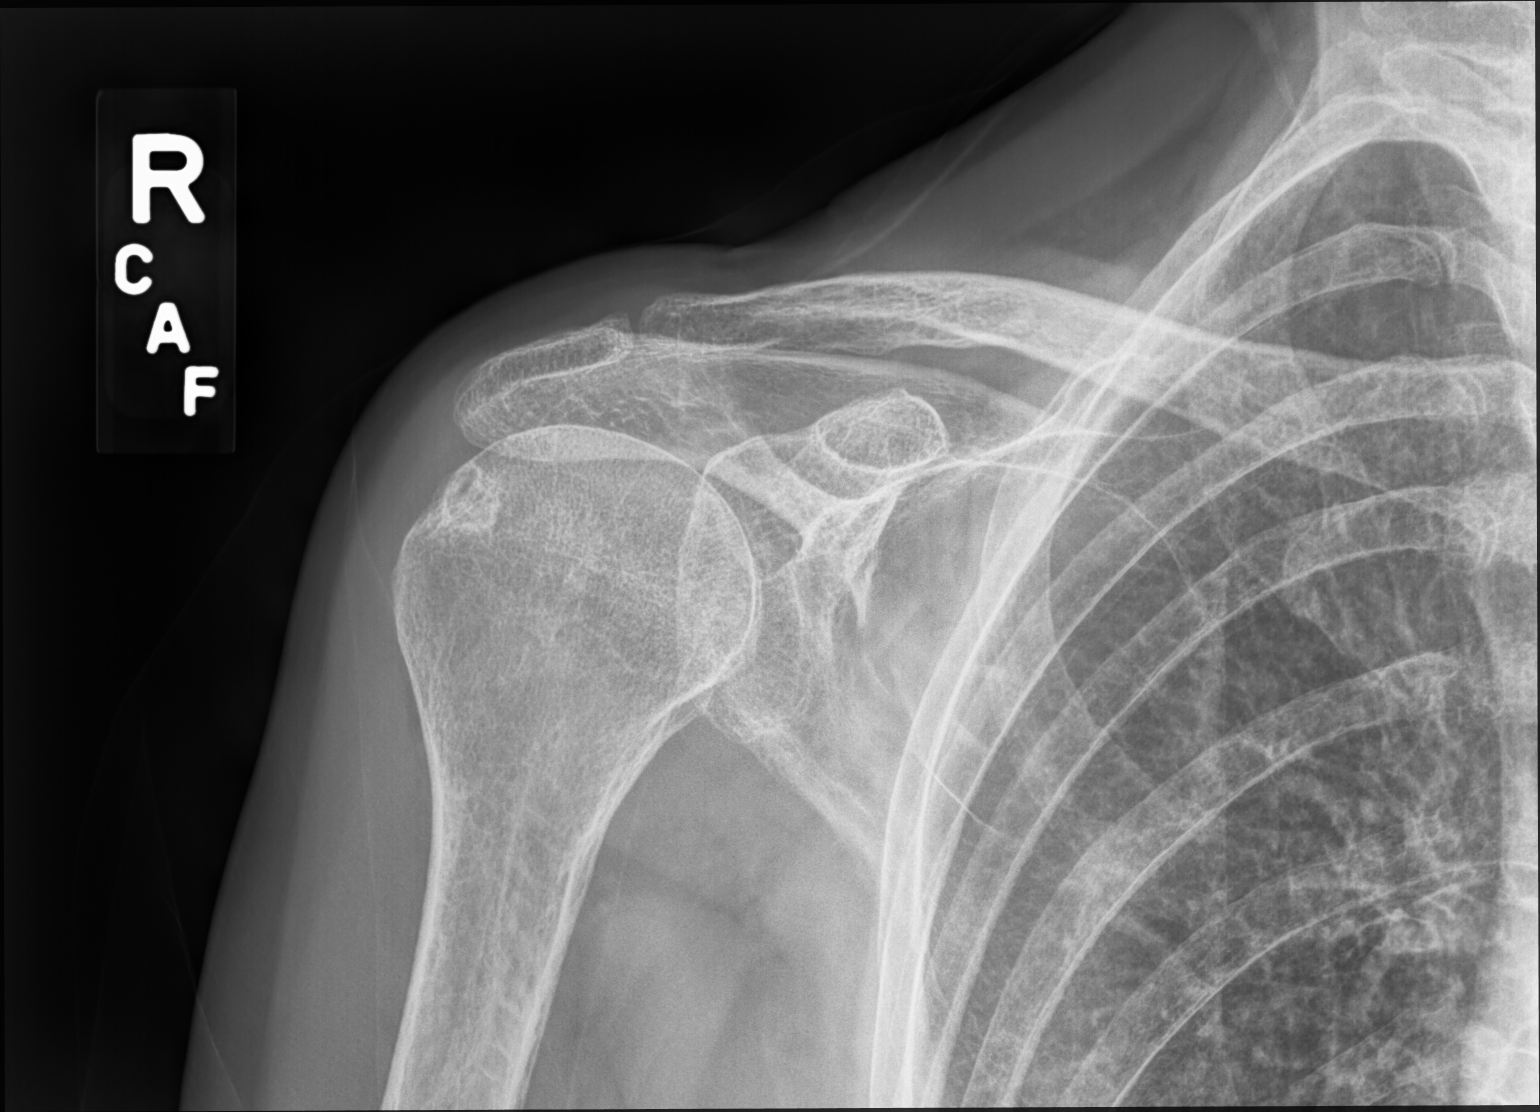

[shoulder y view]
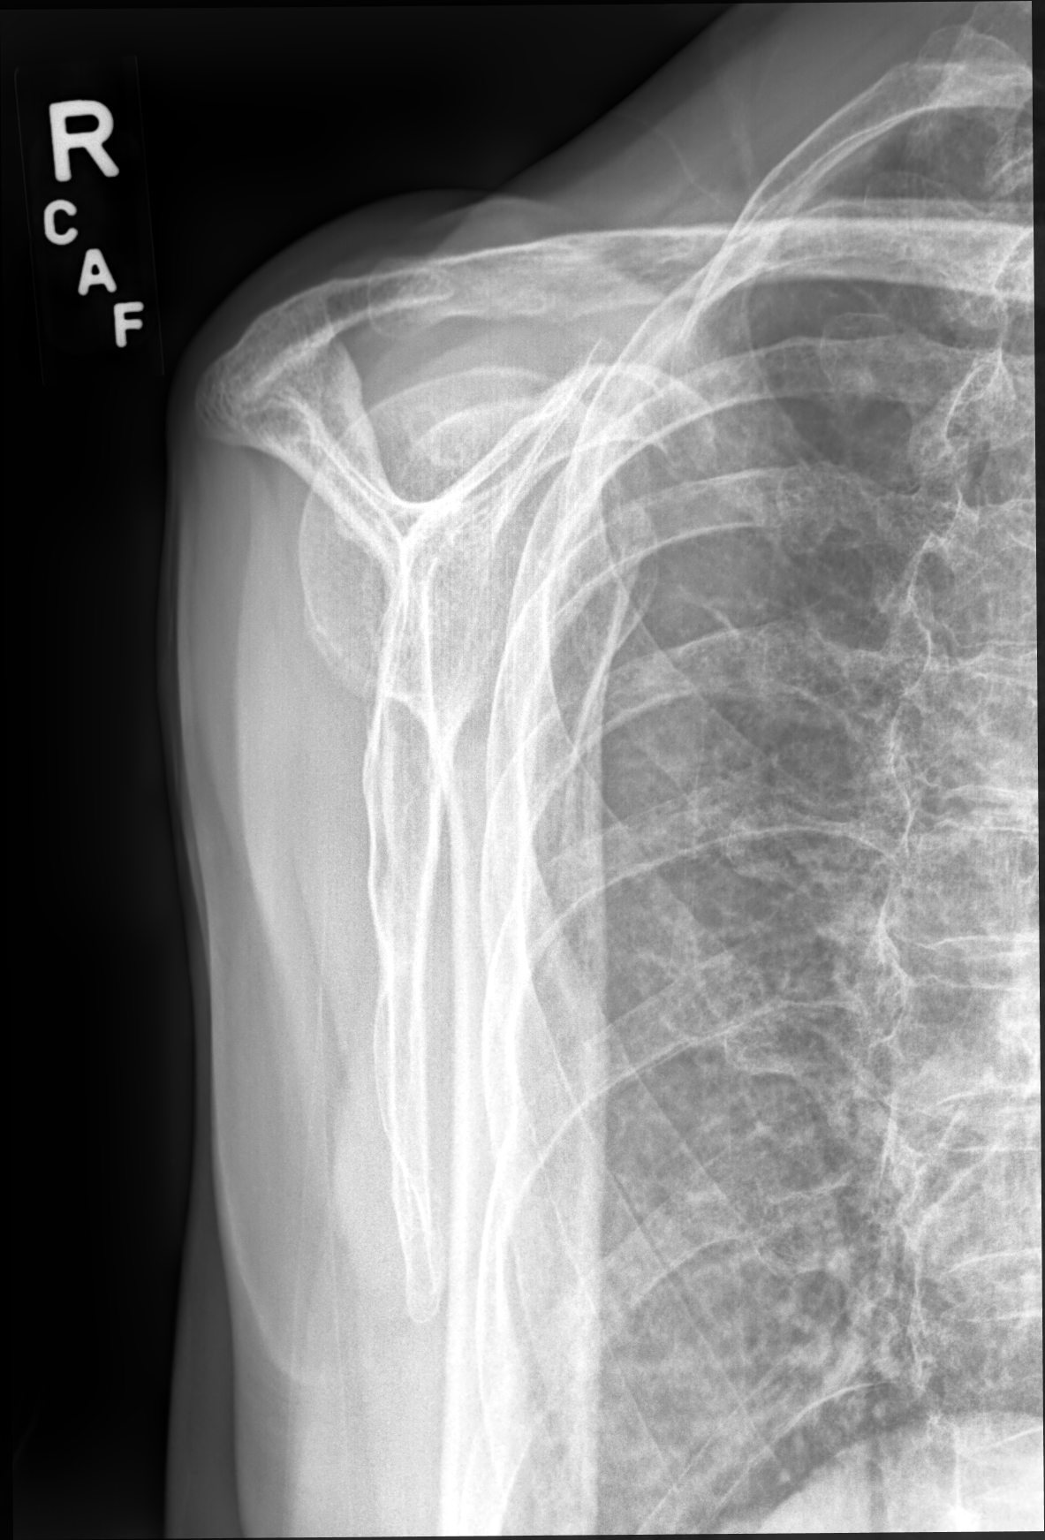

[shoulder axial]
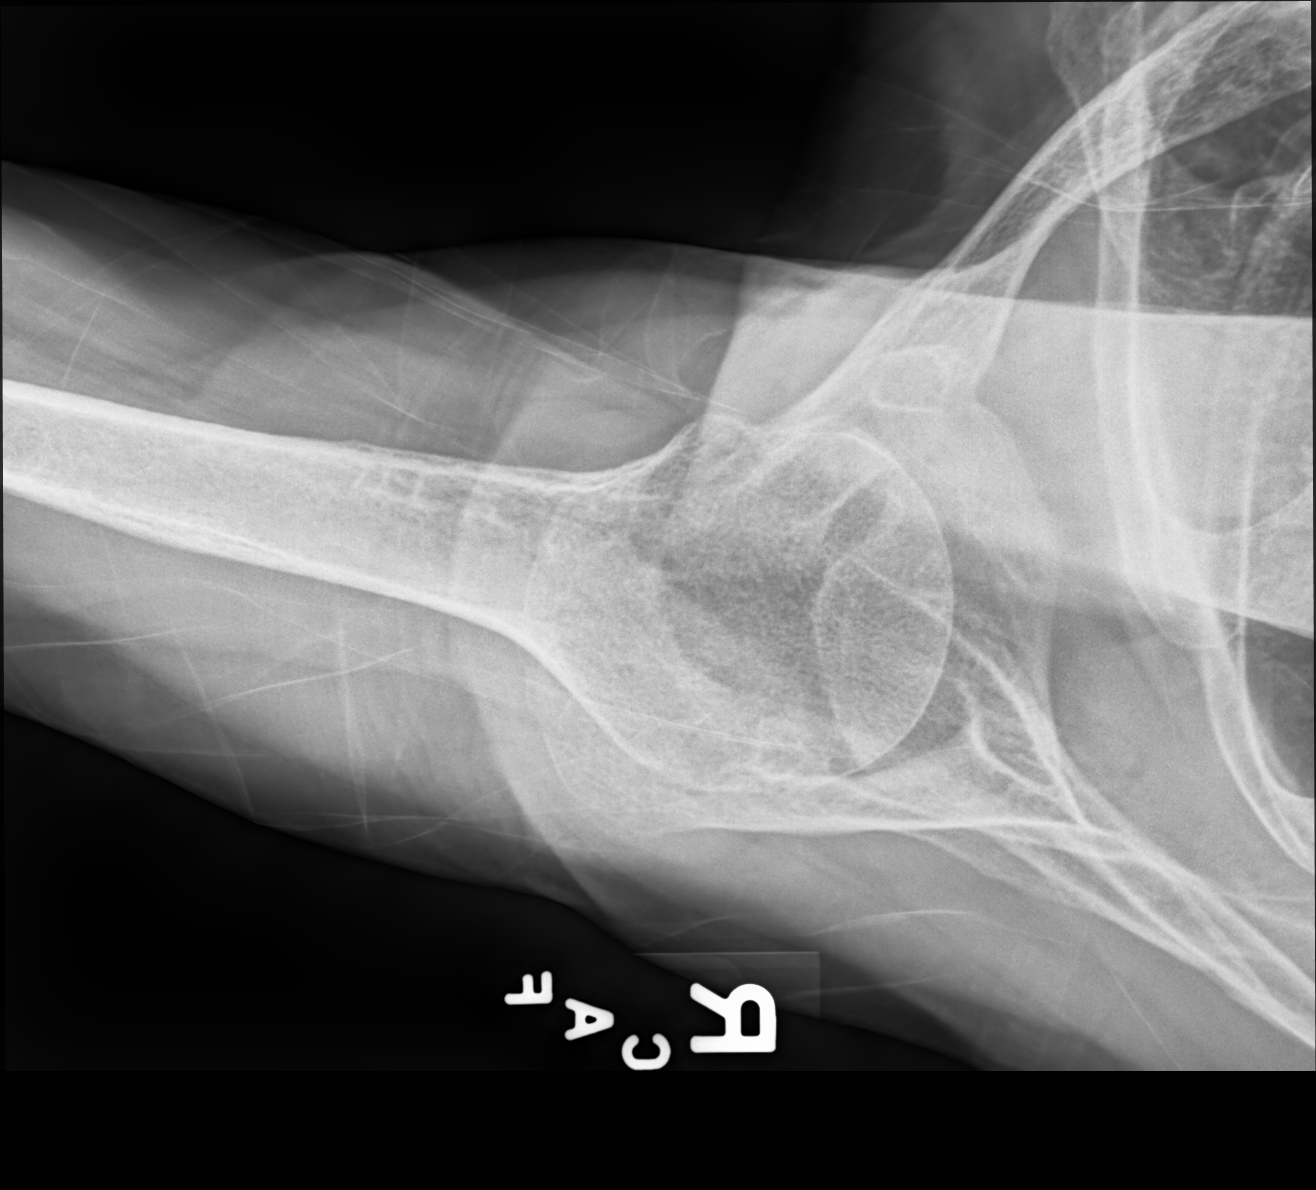

[3 of 3 positions shown; findings below may reference images not displayed]

FINDINGS: There is no evidence of fracture or dislocation. There is no
evidence of arthropathy or other focal bone abnormality. Soft
tissues are unremarkable.
IMPRESSION: Normal right shoulder.

## 2020-03-29 ENCOUNTER — Other Ambulatory Visit: Payer: Self-pay

## 2020-03-29 MED ORDER — TRAMADOL HCL 50 MG PO TABS: 25.0000 mg | ORAL_TABLET | Freq: Two times a day (BID) | ORAL | 2 refills | Status: DC | PRN

## 2020-03-29 NOTE — Telephone Encounter (Signed)
I refilled this-patient could try spacing the medicine out a little bit more or could try 1/2 tablet instead of full tablet

## 2020-03-29 NOTE — Telephone Encounter (Signed)
See pt message

## 2020-03-29 NOTE — Telephone Encounter (Signed)
Pt is about to run out of her Tramadol that she is taking for shingles. She is still having the pain. Pt wants to know if she should start weaning herself, or if she needs another prescription.

## 2020-09-03 ENCOUNTER — Other Ambulatory Visit: Payer: Medicare Other

## 2020-09-03 DIAGNOSIS — Z20822 Contact with and (suspected) exposure to covid-19: Secondary | ICD-10-CM

## 2020-09-06 LAB — NOVEL CORONAVIRUS, NAA: SARS-CoV-2, NAA: DETECTED — AB

## 2020-09-09 ENCOUNTER — Telehealth (INDEPENDENT_AMBULATORY_CARE_PROVIDER_SITE_OTHER): Payer: Medicare Other | Admitting: Family Medicine

## 2020-09-09 ENCOUNTER — Other Ambulatory Visit: Payer: Self-pay

## 2020-09-09 ENCOUNTER — Encounter: Payer: Self-pay | Admitting: Family Medicine

## 2020-09-09 VITALS — BP 150/81 | HR 79 | Temp 97.0°F | Ht 65.0 in | Wt 118.0 lb

## 2020-09-09 DIAGNOSIS — R030 Elevated blood-pressure reading, without diagnosis of hypertension: Secondary | ICD-10-CM

## 2020-09-09 DIAGNOSIS — U071 COVID-19: Secondary | ICD-10-CM | POA: Diagnosis not present

## 2020-09-09 NOTE — Patient Instructions (Addendum)
°  Depression screen [facility] 2/9 09/09/2020 12/24/2019 10/15/2019  Decreased Interest 0 0 0  Down, Depressed, Hopeless 0 0 0  PHQ - 2 Score 0 0 0    Recommended follow up: No follow-ups on file.

## 2020-09-09 NOTE — Progress Notes (Signed)
Phone [phone] Virtual visit via phonenote   Subjective:   Chief Complaint  Patient presents with  . Covid Positive    Headache , feeling fatigue. Patient states that she had a sore throat but it is now gone. She wants to know what she's supposed to take.     This visit type was conducted due to national recommendations for restrictions regarding the COVID-19 Pandemic (e.g. social distancing).  This format is felt to be most appropriate for this patient at this time balancing risks to patient and risks to population by having him in for in person visit.  All issues noted in this document were discussed and addressed.  No physical exam was performed (except for noted visual exam or audio findings with Telehealth visits).  The patient has consented to conduct a Telehealth visit and understands insurance will be billed.   Our team/I connected with Lorin Mercy at  4:00 PM EST by phone (patient did not have equipment for webex) and verified that I am speaking with the correct person using two identifiers.  Location patient: [facility] Location provider: [facility], office Persons participating in the virtual visit:  patient  Time on phone: 11 minutes Counseling provided about covid 19 treatments, isolatoin  Our team/I discussed the limitations of evaluation and management by telemedicine and the availability of in person appointments. In light of current covid-19 pandemic, patient also understands that we are trying to protect them by minimizing in office contact if at all possible.  The patient expressed consent for telemedicine visit and agreed to proceed. Patient understands insurance will be billed.   Past Medical History-  Patient Active Problem List   Diagnosis Date Noted  . Post herpetic neuralgia 12/24/2019    Priority: Medium  . Hyperlipidemia 05/05/2007    Priority: Medium  . Osteoporosis 05/05/2007    Priority: Medium  . Glaucoma 03/12/2016    Priority: Low  . History of  colonic polyps 05/05/2007    Priority: Low    Medications- reviewed and updated Current Outpatient Medications  Medication Sig Dispense Refill  . Acetaminophen (TYLENOL 8 HOUR ARTHRITIS PAIN PO) Take by mouth as needed.    . Vitamin D, Ergocalciferol, (DRISDOL) 1.25 MG (50000 UNIT) CAPS capsule Take 1 capsule (50,000 Units total) by mouth every 7 (seven) days. 13 capsule 1   No current facility-administered medications for this visit.     Objective:  BP (!) 150/81   Pulse 79   Temp (!) 97 F (36.1 C) (Oral)   Ht 5\' 5"  (1.651 m)   Wt 118 lb (53.5 kg)   LMP  (LMP Unknown)   BMI 19.64 kg/m  self reported vitals  Nonlabored voice, normal speech      Assessment and Plan   Covid positive S:patient has been vaccinated with moderna x3. Her symptoms started last Wednesday 12th and tested postiive on 15th. Didn't get results back until Tuesday night of this week. Sore throat is now gone. She continues to have fatigue and having headache with sinus congestion - improving some.   She is doing her best to stay well hydrated. Has taken dayquil and nyquil. She has found this to be helpful. No shortnss of breath or fever. Not having significant cough A/P:  Patient with testing confirming covid 19 with first day of covid 19 symptoms January 12th.  Therefore: - recommended patient watch closely for shortness of breath or confusion or worsening symptoms and if those occur patient should contact us immediately or seek care  in the emergency department -recommended patient consider purchasing pulse oximeter and if levels 94% or below persistently- seek care at the hospital - Patient needs to self isolate  for at least 10 days (new guidelines state 5 days but I prefer 10) since first symptom AND at least 24 hours fever free without fever reducing medications AND have improvement in respiratory symptoms  - earliest possible day out of self isolation January 23rd - she should inform close contacts about  exposure (anyone patient been around unmasked for more than 15 minutes)  - blood pressure running slightly high on dayquil- we discussed stopping this -If High risk for complications-we discussed potential for antibody treatment- she opts out of this- prefers to avoid hospitals at this time  Recommended follow up: as needed for acute concerns  Lab/Order associations:   ICD-10-CM   1. COVID-19  U07.1   2. Elevated blood pressure reading  R03.0    Return precautions advised.  Garret Reddish, MD

## 2020-10-27 ENCOUNTER — Ambulatory Visit (INDEPENDENT_AMBULATORY_CARE_PROVIDER_SITE_OTHER): Payer: Medicare Other

## 2020-10-27 ENCOUNTER — Other Ambulatory Visit: Payer: Self-pay

## 2020-10-27 VITALS — BP 120/80 | HR 56 | Temp 98.1°F | Wt 117.6 lb

## 2020-10-27 DIAGNOSIS — Z0000 Encounter for general adult medical examination without abnormal findings: Secondary | ICD-10-CM | POA: Diagnosis not present

## 2020-10-27 NOTE — Progress Notes (Signed)
Subjective:   Sheila Wood is a 86 y.o. female who presents for Medicare Annual (Subsequent) preventive examination.  Review of Systems     Cardiac Risk Factors include: advanced age (>1men, >36 women);dyslipidemia     Objective:    Today's Vitals   10/27/20 1009 10/27/20 1010  BP:  120/80  Pulse:  (!) 56  Temp:  98.1 F (36.7 C)  SpO2:  99%  Weight:  117 lb 9.6 oz (53.3 kg)  PainSc: 7     Body mass index is 19.57 kg/m.  Advanced Directives 10/27/2020 10/15/2019 02/24/2019 01/14/2019 12/03/2018 09/26/2018 03/27/2017  Does Patient Have a Medical Advance Directive? No No No No No No Yes  Does patient want to make changes to medical advance directive? - Yes (MAU/Ambulatory/Procedural Areas - Information given) - - - - -  Would patient like information on creating a medical advance directive? Yes (MAU/Ambulatory/Procedural Areas - Information given) - - - - No - Patient declined -    Current Medications (verified) Outpatient Encounter Medications as of 10/27/2020  Medication Sig  . Acetaminophen (TYLENOL 8 HOUR ARTHRITIS PAIN PO) Take by mouth as needed.  . Vitamin D, Ergocalciferol, (DRISDOL) 1.25 MG (50000 UNIT) CAPS capsule Take 1 capsule (50,000 Units total) by mouth every 7 (seven) days.   No facility-administered encounter medications on file as of 10/27/2020.    Allergies (verified) Penicillins and Aspirin   History: Past Medical History:  Diagnosis Date  . History of colonic polyps   . Hyperlipidemia   . Internal hemorrhoid   . Osteoporosis   . Shingles    Past Surgical History:  Procedure Laterality Date  . CHOLECYSTECTOMY     at time of colectomy  . COLONOSCOPY    . ESOPHAGOGASTRODUODENOSCOPY    . RIGHT COLECTOMY     adenomatous polyp   Family History  Problem Relation Age of Onset  . Colon cancer Brother   . Heart disease Mother        unclear history  . Diabetes Sister   . Lung cancer Father   . Kidney disease Son   . Prostate cancer Brother     Social History   Socioeconomic History  . Marital status: Married    Spouse name: Not on file  . Number of children: 3  . Years of education: Not on file  . Highest education level: Not on file  Occupational History  . Occupation: Retired   Tobacco Use  . Smoking status: Never Smoker  . Smokeless tobacco: Never Used  Vaping Use  . Vaping Use: Never used  Substance and Sexual Activity  . Alcohol use: Not Currently    Alcohol/week: 2.0 standard drinks    Types: 2 Standard drinks or equivalent per week    Comment: glass of wine- rarely uses  . Drug use: No  . Sexual activity: Not Currently  Other Topics Concern  . Not on file  Social History Narrative   Widowed in Oct 2014. 1 Son lives with her whom is on dialysis; 3 children (daughter, 2 sons) all live close-sees regularly. 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 Difficulty of Paying Living Expenses: Not hard at all  Food  Insecurity: No Food Insecurity  . Worried About Charity fundraiser in the Last Year: Never true  . Ran Out of Food in the Last Year: Never true  Transportation Needs: No Transportation Needs  . Lack of Transportation (Medical): No  . Lack of Transportation (Non-Medical): No  Physical Activity: Inactive  . Days of Exercise per Week: 0 days  . Minutes of Exercise per Session: 0 min  Stress: Stress Concern Present  . Feeling of Stress : To some extent  Social Connections: Moderately Integrated  . Frequency of Communication with Friends and Family: More than three times a week  . Frequency of Social Gatherings with Friends and Family: Once a week  . Attends Religious Services: More than 4 times per year  . Active Member of Clubs or  Organizations: No  . Attends Archivist Meetings: Never  . Marital Status: Married    Tobacco Counseling Counseling given: Not Answered   Clinical Intake:  Pre-visit preparation completed: Yes  Pain : 0-10 Pain Score: 7  Pain Type: Chronic pain Pain Location: Generalized (from shingles) Pain Descriptors / Indicators: Aching Pain Onset: More than a month ago Pain Frequency: Intermittent     BMI - recorded: 19.57 Nutritional Status: BMI of 19-24  Normal Nutritional Risks: None Diabetes: No  How often do you need to have someone help you when you read instructions, pamphlets, or other written materials from your doctor or pharmacy?: 1 - Never  Diabetic?no  Interpreter Needed?: No  Information entered by :: Charlott Rakes, LPN   Activities of Daily Living In your present state of health, do you have any difficulty performing the following activities: 10/27/2020  Hearing? N  Vision? N  Difficulty concentrating or making decisions? N  Walking or climbing stairs? N  Dressing or bathing? N  Doing errands, shopping? N  Preparing Food and eating ? N  Using the Toilet? N  In the past six months, have you accidently leaked urine? N  Do you have problems with loss of bowel control? N  Managing your Medications? N  Managing your Finances? N  Housekeeping or managing your Housekeeping? N  Some recent data might be hidden    Patient Care Team: Marin Olp, MD as PCP - General (Family Medicine) Alda Berthold, DO as Consulting Physician (Neurology)  Indicate any recent Medical Services you may have received from other than [facility] providers in the past year (date may be approximate).     Assessment:   This is a routine wellness examination for [place].  Hearing/Vision screen  Hearing Screening   125Hz  250Hz  500Hz  1000Hz  2000Hz  3000Hz  4000Hz  6000Hz  8000Hz   Right ear:           Left ear:           Comments: Pt denies any hearing issues   Vision  Screening Comments: Pt follows up with Dr Jerline Pain for annual eye exams   Dietary issues and exercise activities discussed: Current Exercise Habits: The patient does not participate in regular exercise at present  Goals    . patient      Go on vacation!      . Patient Stated     Get rid of this shingles post pain      Depression Screen PHQ 2/9 Scores 10/27/2020 09/09/2020 12/24/2019 10/15/2019 09/26/2018 03/27/2017 12/29/2015  PHQ - 2 Score 1 0 0 0 0 0 0    Fall Risk Fall Risk  10/27/2020 10/15/2019 02/24/2019 01/14/2019 09/26/2018  Falls  in the past year? 0 0 0 0 0  Number falls in past yr: 0 0 - 1 -  Injury with Fall? 0 0 - 0 -  Risk for fall due to : Impaired vision;Impaired balance/gait - - - -  Follow up Falls prevention discussed Falls evaluation completed;Education provided;Falls prevention discussed - - -    FALL RISK PREVENTION PERTAINING TO THE HOME:  Any stairs in or around the home? Yes  If so, are there any without handrails? No  Home free of loose throw rugs in walkways, pet beds, electrical cords, etc? Yes  Adequate lighting in your home to reduce risk of falls? Yes   ASSISTIVE DEVICES UTILIZED TO PREVENT FALLS:  Life alert? No  Use of a cane, walker or w/c? No  Grab bars in the bathroom? No  Shower chair or bench in shower? Yes  Elevated toilet seat or a handicapped toilet? No   TIMED UP AND GO:  Was the test performed? Yes .  Length of time to ambulate 10 feet: 10 sec.   Gait steady and fast without use of assistive device  Cognitive Function: MMSE - Mini Mental State Exam 09/26/2018  Orientation to time 5  Orientation to Place 5  Registration 3  Attention/ Calculation 5  Recall 3  Language- name 2 objects 2  Language- repeat 1  Language- follow 3 step command 3  Language- read & follow direction 1  Write a sentence 1  Copy design 1  Total score 30     6CIT Screen 10/27/2020 10/15/2019  What Year? 0 points 0 points  What month? 0 points 0 points  What  time? - 0 points  Count back from 20 0 points 0 points  Months in reverse 0 points 0 points  Repeat phrase 0 points 0 points  Total Score - 0    Immunizations Immunization History  Administered Date(s) Administered  . Influenza Whole 06/05/2010  . Influenza, High Dose Seasonal PF 07/01/2017  . Moderna Sars-Covid-2 Vaccination 01/02/2020, 01/30/2020, 08/02/2020    TDAP status: Due, Education has been provided regarding the importance of this vaccine. Advised may receive this vaccine at local pharmacy or Health Dept. Aware to provide a copy of the vaccination record if obtained from local pharmacy or Health Dept. Verbalized acceptance and understanding.  Flu Vaccine status: Declined, Education has been provided regarding the importance of this vaccine but patient still declined. Advised may receive this vaccine at local pharmacy or Health Dept. Aware to provide a copy of the vaccination record if obtained from local pharmacy or Health Dept. Verbalized acceptance and understanding.  Pneumococcal vaccine status: Declined,  Education has been provided regarding the importance of this vaccine but patient still declined. Advised may receive this vaccine at local pharmacy or Health Dept. Aware to provide a copy of the vaccination record if obtained from local pharmacy or Health Dept. Verbalized acceptance and understanding.   Covid-19 vaccine status: Completed vaccines  Qualifies for Shingles Vaccine? Yes   Zostavax completed No   Shingrix Completed?: No.    Education has been provided regarding the importance of this vaccine. Patient has been advised to call insurance company to determine out of pocket expense if they have not yet received this vaccine. Advised may also receive vaccine at local pharmacy or Health Dept. Verbalized acceptance and understanding.  Screening Tests Health Maintenance  Topic Date Due  . INFLUENZA VACCINE  11/17/2020 (Originally 03/20/2020)  . DEXA SCAN  09/07/2024  (Originally 08/23/2001)  .  TETANUS/TDAP  09/07/2024 (Originally 08/24/1953)  . PNA vac Low Risk Adult (1 of 2 - PCV13) 03/24/2029 (Originally 08/23/2001)  . COVID-19 Vaccine  Completed  . HPV VACCINES  Aged Out    Health Maintenance  There are no preventive care reminders to display for this patient.  Colorectal cancer screening: No longer required.   Mammogram status: No longer required due to age.   Additional Screening:   Vision Screening: Recommended annual ophthalmology exams for early detection of glaucoma and other disorders of the eye. Is the patient up to date with their annual eye exam?  Yes  Who is the provider or what is the name of the office in which the patient attends annual eye exams? Dr Jerline Pain  If pt is not established with a provider, would they like to be referred to a provider to establish care? No .   Dental Screening: Recommended annual dental exams for proper oral hygiene  Community Resource Referral / Chronic Care Management: CRR required this visit?  No   CCM required this visit?  No      Plan:     I have personally reviewed and noted the following in the patient's chart:   . Medical and social history . Use of alcohol, tobacco or illicit drugs  . Current medications and supplements . Functional ability and status . Nutritional status . Physical activity . Advanced directives . List of other physicians . Hospitalizations, surgeries, and ER visits in previous 12 months . Vitals . Screenings to include cognitive, depression, and falls . Referrals and appointments  In addition, I have reviewed and discussed with patient certain preventive protocols, quality metrics, and best practice recommendations. A written personalized care plan for preventive services as well as general preventive health recommendations were provided to patient.     Willette Brace, LPN   02/25/6437   Nurse Notes: None

## 2020-10-27 NOTE — Patient Instructions (Addendum)
Sheila Wood , Thank you for taking time to come for your Medicare Wellness Visit. I appreciate your ongoing commitment to your health goals. Please review the following plan we discussed and let me know if I can assist you in the future.   Screening recommendations/referrals: Colonoscopy: No longer required  Mammogram: no longer required Recommended yearly ophthalmology/optometry visit for glaucoma screening and checkup Recommended yearly dental visit for hygiene and checkup  Vaccinations: Influenza vaccine: declined Pneumococcal vaccine: declined Tdap vaccine: declined Shingles vaccine: Shingrix discussed. Please contact your pharmacy for coverage information.  Covid-19:Completed 5/15, 6/12, & 08/02/20   Advanced directives: Please bring a copy of your health care power of attorney and living will to the office at your convenience.  Conditions/risks identified: get rid of pain from post shingles   Next appointment: Follow up in one year for your annual wellness visit    Preventive Care 84 Years and Older, Female Preventive care refers to lifestyle choices and visits with your health care provider that can promote health and wellness. What does preventive care include?  A yearly physical exam. This is also called an annual well check.  Dental exams once or twice a year.  Routine eye exams. Ask your health care provider how often you should have your eyes checked.  Personal lifestyle choices, including:  Daily care of your teeth and gums.  Regular physical activity.  Eating a healthy diet.  Avoiding tobacco and drug use.  Limiting alcohol use.  Practicing safe sex.  Taking low-dose aspirin every day.  Taking vitamin and mineral supplements as recommended by your health care provider. What happens during an annual well check? The services and screenings done by your health care provider during your annual well check will depend on your age, overall health, lifestyle risk  factors, and family history of disease. Counseling  Your health care provider may ask you questions about your:  Alcohol use.  Tobacco use.  Drug use.  Emotional well-being.  Home and relationship well-being.  Sexual activity.  Eating habits.  History of falls.  Memory and ability to understand (cognition).  Work and work Statistician.  Reproductive health. Screening  You may have the following tests or measurements:  Height, weight, and BMI.  Blood pressure.  Lipid and cholesterol levels. These may be checked every 5 years, or more frequently if you are over 84 years old.  Skin check.  Lung cancer screening. You may have this screening every year starting at age 84 if you have a 30-pack-year history of smoking and currently smoke or have quit within the past 15 years.  Fecal occult blood test (FOBT) of the stool. You may have this test every year starting at age 84.  Flexible sigmoidoscopy or colonoscopy. You may have a sigmoidoscopy every 5 years or a colonoscopy every 10 years starting at age 84.  Hepatitis C blood test.  Hepatitis B blood test.  Sexually transmitted disease (STD) testing.  Diabetes screening. This is done by checking your blood sugar (glucose) after you have not eaten for a while (fasting). You may have this done every 1-3 years.  Bone density scan. This is done to screen for osteoporosis. You may have this done starting at age 84.  Mammogram. This may be done every 1-2 years. Talk to your health care provider about how often you should have regular mammograms. Talk with your health care provider about your test results, treatment options, and if necessary, the need for more tests. Vaccines  Your health  care provider may recommend certain vaccines, such as:  Influenza vaccine. This is recommended every year.  Tetanus, diphtheria, and acellular pertussis (Tdap, Td) vaccine. You may need a Td booster every 10 years.  Zoster vaccine. You  may need this after age 76.  Pneumococcal 13-valent conjugate (PCV13) vaccine. One dose is recommended after age 84.  Pneumococcal polysaccharide (PPSV23) vaccine. One dose is recommended after age 84. Talk to your health care provider about which screenings and vaccines you need and how often you need them. This information is not intended to replace advice given to you by your health care provider. Make sure you discuss any questions you have with your health care provider. Document Released: 09/02/2015 Document Revised: 04/25/2016 Document Reviewed: 06/07/2015 Elsevier Interactive Patient Education  2017 [place] Prevention in the Home Falls can cause injuries. They can happen to people of all ages. There are many things you can do to make your home safe and to help prevent falls. What can I do on the outside of my home?  Regularly fix the edges of walkways and driveways and fix any cracks.  Remove anything that might make you trip as you walk through a door, such as a raised step or threshold.  Trim any bushes or trees on the path to your home.  Use bright outdoor lighting.  Clear any walking paths of anything that might make someone trip, such as rocks or tools.  Regularly check to see if handrails are loose or broken. Make sure that both sides of any steps have handrails.  Any raised decks and porches should have guardrails on the edges.  Have any leaves, snow, or ice cleared regularly.  Use sand or salt on walking paths during winter.  Clean up any spills in your garage right away. This includes oil or grease spills. What can I do in the bathroom?  Use night lights.  Install grab bars by the toilet and in the tub and shower. Do not use towel bars as grab bars.  Use non-skid mats or decals in the tub or shower.  If you need to sit down in the shower, use a plastic, non-slip stool.  Keep the floor dry. Clean up any water that spills on the floor as soon as  it happens.  Remove soap buildup in the tub or shower regularly.  Attach bath mats securely with double-sided non-slip rug tape.  Do not have throw rugs and other things on the floor that can make you trip. What can I do in the bedroom?  Use night lights.  Make sure that you have a light by your bed that is easy to reach.  Do not use any sheets or blankets that are too big for your bed. They should not hang down onto the floor.  Have a firm chair that has side arms. You can use this for support while you get dressed.  Do not have throw rugs and other things on the floor that can make you trip. What can I do in the kitchen?  Clean up any spills right away.  Avoid walking on wet floors.  Keep items that you use a lot in easy-to-reach places.  If you need to reach something above you, use a strong step stool that has a grab bar.  Keep electrical cords out of the way.  Do not use floor polish or wax that makes floors slippery. If you must use wax, use non-skid floor wax.  Do not have  throw rugs and other things on the floor that can make you trip. What can I do with my stairs?  Do not leave any items on the stairs.  Make sure that there are handrails on both sides of the stairs and use them. Fix handrails that are broken or loose. Make sure that handrails are as long as the stairways.  Check any carpeting to make sure that it is firmly attached to the stairs. Fix any carpet that is loose or worn.  Avoid having throw rugs at the top or bottom of the stairs. If you do have throw rugs, attach them to the floor with carpet tape.  Make sure that you have a light switch at the top of the stairs and the bottom of the stairs. If you do not have them, ask someone to add them for you. What else can I do to help prevent falls?  Wear shoes that:  Do not have high heels.  Have rubber bottoms.  Are comfortable and fit you well.  Are closed at the toe. Do not wear sandals.  If  you use a stepladder:  Make sure that it is fully opened. Do not climb a closed stepladder.  Make sure that both sides of the stepladder are locked into place.  Ask someone to hold it for you, if possible.  Clearly mark and make sure that you can see:  Any grab bars or handrails.  First and last steps.  Where the edge of each step is.  Use tools that help you move around (mobility aids) if they are needed. These include:  Canes.  Walkers.  Scooters.  Crutches.  Turn on the lights when you go into a dark area. Replace any light bulbs as soon as they burn out.  Set up your furniture so you have a clear path. Avoid moving your furniture around.  If any of your floors are uneven, fix them.  If there are any pets around you, be aware of where they are.  Review your medicines with your doctor. Some medicines can make you feel dizzy. This can increase your chance of falling. Ask your doctor what other things that you can do to help prevent falls. This information is not intended to replace advice given to you by your health care provider. Make sure you discuss any questions you have with your health care provider. Document Released: 06/02/2009 Document Revised: 01/12/2016 Document Reviewed: 09/10/2014 Elsevier Interactive Patient Education  2017 [employer].

## 2020-11-21 ENCOUNTER — Ambulatory Visit (INDEPENDENT_AMBULATORY_CARE_PROVIDER_SITE_OTHER): Payer: Medicare Other | Admitting: Physician Assistant

## 2020-11-21 ENCOUNTER — Other Ambulatory Visit: Payer: Self-pay

## 2020-11-21 ENCOUNTER — Encounter: Payer: Self-pay | Admitting: Physician Assistant

## 2020-11-21 ENCOUNTER — Telehealth: Payer: Self-pay

## 2020-11-21 VITALS — BP 139/73 | HR 57 | Temp 98.3°F | Ht 65.0 in | Wt 119.4 lb

## 2020-11-21 DIAGNOSIS — R21 Rash and other nonspecific skin eruption: Secondary | ICD-10-CM | POA: Diagnosis not present

## 2020-11-21 DIAGNOSIS — T7840XA Allergy, unspecified, initial encounter: Secondary | ICD-10-CM | POA: Diagnosis not present

## 2020-11-21 MED ORDER — METHYLPREDNISOLONE ACETATE 80 MG/ML IJ SUSP
80.0000 mg | Freq: Once | INTRAMUSCULAR | Status: AC
  Administered 2020-11-21: 80 mg via INTRAMUSCULAR

## 2020-11-21 MED ORDER — FAMOTIDINE 20 MG PO TABS: 20.0000 mg | ORAL_TABLET | Freq: Two times a day (BID) | ORAL | 0 refills | Status: DC

## 2020-11-21 NOTE — Progress Notes (Signed)
Acute Office Visit  Subjective:    Patient ID: Sheila Wood, female    DOB: August 14, 1935, 86 y.o.   MRN: [id number]  Chief Complaint  Patient presents with  . Rash    HPI Patient is in today for new rash x 1-2 days. She tried CBD gummies for Shingles pain most of last week and then started with the rash.  She is very itchy.  She has put some hydrocortisone cream on the area and tried antihistamine use.  It started on her buttocks and is now spreading to her legs, chest and abdomen, and upper arms.  She is not having any trouble breathing or swallowing.  She denies any throat closing or wheezing.  No rashes around her mouth or in her mouth.  Past Medical History:  Diagnosis Date  . History of colonic polyps   . Hyperlipidemia   . Internal hemorrhoid   . Osteoporosis   . Shingles     Past Surgical History:  Procedure Laterality Date  . CHOLECYSTECTOMY     at time of colectomy  . COLONOSCOPY    . ESOPHAGOGASTRODUODENOSCOPY    . RIGHT COLECTOMY     adenomatous polyp    Family History  Problem Relation Age of Onset  . Colon cancer Brother   . Heart disease Mother        unclear history  . Diabetes Sister   . Lung cancer Father   . Kidney disease Son   . Prostate cancer Brother     Social History   Socioeconomic History  . Marital status: Married    Spouse name: Not on file  . Number of children: 3  . Years of education: Not on file  . Highest education level: Not on file  Occupational History  . Occupation: Retired   Tobacco Use  . Smoking status: Never Smoker  . Smokeless tobacco: Never Used  Vaping Use  . Vaping Use: Never used  Substance and Sexual Activity  . Alcohol use: Not Currently    Alcohol/week: 2.0 standard drinks    Types: 2 Standard drinks or equivalent per week    Comment: glass of wine- rarely uses  . Drug use: No  . Sexual activity: Not Currently  Other Topics Concern  . Not on file  Social History Narrative   Widowed in Oct 2014. 1 Son  lives with her whom is on dialysis; 3 children (daughter, 2 sons) all live close-sees regularly. 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 Difficulty of Paying Living Expenses: Not hard at all  Food Insecurity: No Food Insecurity  . Worried About Charity fundraiser in the Last Year: Never true  . Ran Out of Food in the Last Year: Never true  Transportation Needs: No Transportation Needs  . Lack of Transportation (Medical): No  . Lack of Transportation (Non-Medical): No  Physical Activity: Inactive  . Days of Exercise per Week: 0 days  . Minutes of Exercise per Session: 0 min  Stress: Stress Concern Present  . Feeling of Stress : To some extent  Social Connections: Moderately Integrated  . Frequency of Communication with Friends and Family: More than three times a week  .  Frequency of Social Gatherings with Friends and Family: Once a week  . Attends Religious Services: More than 4 times per year  . Active Member of Clubs or Organizations: No  . Attends Archivist Meetings: Never  . Marital Status: Married  Human resources officer Violence: Not At Risk  . Fear of Current or Ex-Partner: No  . Emotionally Abused: No  . Physically Abused: No  . Sexually Abused: No    Outpatient Medications Prior to Visit  Medication Sig Dispense Refill  . Acetaminophen (TYLENOL 8 HOUR ARTHRITIS PAIN PO) Take by mouth as needed.    . Vitamin D, Ergocalciferol, (DRISDOL) 1.25 MG (50000 UNIT) CAPS capsule Take 1 capsule (50,000 Units total) by mouth every 7 (seven) days. 13 capsule 1   No facility-administered medications prior to visit.    Allergies  Allergen Reactions  . Cannabinoids Hives  . Penicillins Rash  . Aspirin      Review of Systems REFER TO HPI FOR PERTINENT POSITIVES AND NEGATIVES     Objective:    Physical Exam Vitals and nursing note reviewed.  Constitutional:      Appearance: Normal appearance.  Cardiovascular:     Rate and Rhythm: Normal rate and regular rhythm.     Pulses: Normal pulses.  Pulmonary:     Effort: Pulmonary effort is normal.     Breath sounds: Normal breath sounds.  Skin:    Comments: Macular allergic dermatitis patches diffusely  Neurological:     Mental Status: She is alert.     BP 139/73   Pulse (!) 57   Temp 98.3 F (36.8 C)   Ht 5\' 5"  (1.651 m)   Wt 119 lb 6.1 oz (54.2 kg)   LMP  (LMP Unknown)   SpO2 99%   BMI 19.87 kg/m  Wt Readings from Last 3 Encounters:  11/21/20 119 lb 6.1 oz (54.2 kg)  10/27/20 117 lb 9.6 oz (53.3 kg)  09/09/20 118 lb (53.5 kg)    There are no preventive care reminders to display for this patient.  There are no preventive care reminders to display for this patient.   Lab Results  Component Value Date   TSH 0.79 09/07/2014   Lab Results  Component Value Date   WBC 5.6 12/24/2019   HGB 13.8 12/24/2019   HCT 41.7 12/24/2019   MCV 93.4 12/24/2019   PLT 219.0 12/24/2019   Lab Results  Component Value Date   NA 138 12/24/2019   K 4.7 12/24/2019   CO2 28 12/24/2019   GLUCOSE 91 12/24/2019   BUN 17 12/24/2019   CREATININE 0.71 12/24/2019   BILITOT 0.7 12/24/2019   ALKPHOS 105 12/24/2019   AST 14 12/24/2019   ALT 10 12/24/2019   PROT 6.8 12/24/2019   ALBUMIN 4.3 12/24/2019   CALCIUM 9.4 12/24/2019   ANIONGAP 7 12/03/2018   GFR 78.55 12/24/2019   Lab Results  Component Value Date   CHOL 242 (H) 12/24/2019   Lab Results  Component Value Date   HDL 83.50 12/24/2019   Lab Results  Component Value Date   LDLCALC 131 (H) 12/24/2019   Lab Results  Component Value Date   TRIG 139.0 12/24/2019   Lab Results  Component Value Date   CHOLHDL 3 12/24/2019   No results found for: HGBA1C      Assessment & Plan:   Problem List Items Addressed This Visit   None   Visit Diagnoses    Allergic reaction to drug, initial encounter    -  Primary   Relevant Medications   methylPREDNISolone acetate (DEPO-MEDROL) injection 80 mg (Start on 11/21/2020 12:00 PM)       Meds ordered this encounter  Medications  . famotidine (PEPCID) 20 MG tablet    Sig: Take 1 tablet (20 mg total) by mouth 2 (two) times daily for 15 days.    Dispense:  30 tablet    Refill:  0  . methylPREDNISolone acetate (DEPO-MEDROL) injection 80 mg   1. Allergic reaction to drug, initial encounter I agree with patient.  This looks like a reaction to the CBD Gummies she tried.  She denies any other new changes in the last week.  Gave 80 mg Depo-Medrol in office today to help with some of the pruritus.  She will continue to take daily antihistamine.  Also suggested famotidine twice daily and keeping cool.  She will not restart the CBD Gummies.  She will call if worse or no improvement of symptoms.  This note was prepared with assistance of Systems analyst. Occasional wrong-word or sound-a-like substitutions may have occurred due to the inherent limitations of voice recognition software.    Gladstone Rosas M Marquise Wicke, PA-C

## 2020-11-21 NOTE — Telephone Encounter (Signed)
Error

## 2020-11-21 NOTE — Patient Instructions (Signed)
This looks like a reaction to the CBD gummies. I will add this to allergy list and please do not take them again.  A steroid injection was given to you in office today to help with your symptoms.  Please keep cool and hydrated. Continue daily antihistamine such as Allegra, Zyrtec, or Claritin. Also take Famotidine 20 mg twice daily.  Call if any changes, worsening, or no improvement of symptoms.

## 2021-01-06 DIAGNOSIS — M9904 Segmental and somatic dysfunction of sacral region: Secondary | ICD-10-CM | POA: Diagnosis not present

## 2021-01-06 DIAGNOSIS — M9905 Segmental and somatic dysfunction of pelvic region: Secondary | ICD-10-CM | POA: Diagnosis not present

## 2021-01-06 DIAGNOSIS — M9901 Segmental and somatic dysfunction of cervical region: Secondary | ICD-10-CM | POA: Diagnosis not present

## 2021-01-06 DIAGNOSIS — M9903 Segmental and somatic dysfunction of lumbar region: Secondary | ICD-10-CM | POA: Diagnosis not present

## 2021-01-09 DIAGNOSIS — M9905 Segmental and somatic dysfunction of pelvic region: Secondary | ICD-10-CM | POA: Diagnosis not present

## 2021-01-09 DIAGNOSIS — M9903 Segmental and somatic dysfunction of lumbar region: Secondary | ICD-10-CM | POA: Diagnosis not present

## 2021-01-09 DIAGNOSIS — M9901 Segmental and somatic dysfunction of cervical region: Secondary | ICD-10-CM | POA: Diagnosis not present

## 2021-01-09 DIAGNOSIS — M9904 Segmental and somatic dysfunction of sacral region: Secondary | ICD-10-CM | POA: Diagnosis not present

## 2021-01-10 DIAGNOSIS — M9901 Segmental and somatic dysfunction of cervical region: Secondary | ICD-10-CM | POA: Diagnosis not present

## 2021-01-10 DIAGNOSIS — M9905 Segmental and somatic dysfunction of pelvic region: Secondary | ICD-10-CM | POA: Diagnosis not present

## 2021-01-10 DIAGNOSIS — M9903 Segmental and somatic dysfunction of lumbar region: Secondary | ICD-10-CM | POA: Diagnosis not present

## 2021-01-10 DIAGNOSIS — M9904 Segmental and somatic dysfunction of sacral region: Secondary | ICD-10-CM | POA: Diagnosis not present

## 2021-01-12 DIAGNOSIS — M9905 Segmental and somatic dysfunction of pelvic region: Secondary | ICD-10-CM | POA: Diagnosis not present

## 2021-01-12 DIAGNOSIS — M9901 Segmental and somatic dysfunction of cervical region: Secondary | ICD-10-CM | POA: Diagnosis not present

## 2021-01-12 DIAGNOSIS — M9904 Segmental and somatic dysfunction of sacral region: Secondary | ICD-10-CM | POA: Diagnosis not present

## 2021-01-12 DIAGNOSIS — M9903 Segmental and somatic dysfunction of lumbar region: Secondary | ICD-10-CM | POA: Diagnosis not present

## 2021-01-17 DIAGNOSIS — M9903 Segmental and somatic dysfunction of lumbar region: Secondary | ICD-10-CM | POA: Diagnosis not present

## 2021-01-17 DIAGNOSIS — M9901 Segmental and somatic dysfunction of cervical region: Secondary | ICD-10-CM | POA: Diagnosis not present

## 2021-01-17 DIAGNOSIS — M9905 Segmental and somatic dysfunction of pelvic region: Secondary | ICD-10-CM | POA: Diagnosis not present

## 2021-01-17 DIAGNOSIS — M9904 Segmental and somatic dysfunction of sacral region: Secondary | ICD-10-CM | POA: Diagnosis not present

## 2021-01-18 DIAGNOSIS — M9901 Segmental and somatic dysfunction of cervical region: Secondary | ICD-10-CM | POA: Diagnosis not present

## 2021-01-18 DIAGNOSIS — M9904 Segmental and somatic dysfunction of sacral region: Secondary | ICD-10-CM | POA: Diagnosis not present

## 2021-01-18 DIAGNOSIS — M9905 Segmental and somatic dysfunction of pelvic region: Secondary | ICD-10-CM | POA: Diagnosis not present

## 2021-01-18 DIAGNOSIS — M9903 Segmental and somatic dysfunction of lumbar region: Secondary | ICD-10-CM | POA: Diagnosis not present

## 2021-01-20 DIAGNOSIS — M9903 Segmental and somatic dysfunction of lumbar region: Secondary | ICD-10-CM | POA: Diagnosis not present

## 2021-01-20 DIAGNOSIS — M9904 Segmental and somatic dysfunction of sacral region: Secondary | ICD-10-CM | POA: Diagnosis not present

## 2021-01-20 DIAGNOSIS — M9905 Segmental and somatic dysfunction of pelvic region: Secondary | ICD-10-CM | POA: Diagnosis not present

## 2021-01-20 DIAGNOSIS — M9901 Segmental and somatic dysfunction of cervical region: Secondary | ICD-10-CM | POA: Diagnosis not present

## 2021-01-23 DIAGNOSIS — M9901 Segmental and somatic dysfunction of cervical region: Secondary | ICD-10-CM | POA: Diagnosis not present

## 2021-01-23 DIAGNOSIS — M9903 Segmental and somatic dysfunction of lumbar region: Secondary | ICD-10-CM | POA: Diagnosis not present

## 2021-01-23 DIAGNOSIS — M9904 Segmental and somatic dysfunction of sacral region: Secondary | ICD-10-CM | POA: Diagnosis not present

## 2021-01-23 DIAGNOSIS — M9905 Segmental and somatic dysfunction of pelvic region: Secondary | ICD-10-CM | POA: Diagnosis not present

## 2021-01-25 DIAGNOSIS — M9901 Segmental and somatic dysfunction of cervical region: Secondary | ICD-10-CM | POA: Diagnosis not present

## 2021-01-25 DIAGNOSIS — M9903 Segmental and somatic dysfunction of lumbar region: Secondary | ICD-10-CM | POA: Diagnosis not present

## 2021-01-25 DIAGNOSIS — M9904 Segmental and somatic dysfunction of sacral region: Secondary | ICD-10-CM | POA: Diagnosis not present

## 2021-01-25 DIAGNOSIS — M9905 Segmental and somatic dysfunction of pelvic region: Secondary | ICD-10-CM | POA: Diagnosis not present

## 2021-01-27 DIAGNOSIS — M9901 Segmental and somatic dysfunction of cervical region: Secondary | ICD-10-CM | POA: Diagnosis not present

## 2021-01-27 DIAGNOSIS — M9903 Segmental and somatic dysfunction of lumbar region: Secondary | ICD-10-CM | POA: Diagnosis not present

## 2021-01-27 DIAGNOSIS — M9904 Segmental and somatic dysfunction of sacral region: Secondary | ICD-10-CM | POA: Diagnosis not present

## 2021-01-27 DIAGNOSIS — M9905 Segmental and somatic dysfunction of pelvic region: Secondary | ICD-10-CM | POA: Diagnosis not present

## 2021-01-30 DIAGNOSIS — M9903 Segmental and somatic dysfunction of lumbar region: Secondary | ICD-10-CM | POA: Diagnosis not present

## 2021-01-30 DIAGNOSIS — M9901 Segmental and somatic dysfunction of cervical region: Secondary | ICD-10-CM | POA: Diagnosis not present

## 2021-01-30 DIAGNOSIS — M9905 Segmental and somatic dysfunction of pelvic region: Secondary | ICD-10-CM | POA: Diagnosis not present

## 2021-01-30 DIAGNOSIS — M9904 Segmental and somatic dysfunction of sacral region: Secondary | ICD-10-CM | POA: Diagnosis not present

## 2021-02-01 DIAGNOSIS — M9905 Segmental and somatic dysfunction of pelvic region: Secondary | ICD-10-CM | POA: Diagnosis not present

## 2021-02-01 DIAGNOSIS — M9904 Segmental and somatic dysfunction of sacral region: Secondary | ICD-10-CM | POA: Diagnosis not present

## 2021-02-01 DIAGNOSIS — M9901 Segmental and somatic dysfunction of cervical region: Secondary | ICD-10-CM | POA: Diagnosis not present

## 2021-02-01 DIAGNOSIS — M9903 Segmental and somatic dysfunction of lumbar region: Secondary | ICD-10-CM | POA: Diagnosis not present

## 2021-02-03 DIAGNOSIS — M9901 Segmental and somatic dysfunction of cervical region: Secondary | ICD-10-CM | POA: Diagnosis not present

## 2021-02-03 DIAGNOSIS — M9905 Segmental and somatic dysfunction of pelvic region: Secondary | ICD-10-CM | POA: Diagnosis not present

## 2021-02-03 DIAGNOSIS — M9903 Segmental and somatic dysfunction of lumbar region: Secondary | ICD-10-CM | POA: Diagnosis not present

## 2021-02-03 DIAGNOSIS — M9904 Segmental and somatic dysfunction of sacral region: Secondary | ICD-10-CM | POA: Diagnosis not present

## 2021-02-06 DIAGNOSIS — M9901 Segmental and somatic dysfunction of cervical region: Secondary | ICD-10-CM | POA: Diagnosis not present

## 2021-02-06 DIAGNOSIS — M9905 Segmental and somatic dysfunction of pelvic region: Secondary | ICD-10-CM | POA: Diagnosis not present

## 2021-02-06 DIAGNOSIS — M9903 Segmental and somatic dysfunction of lumbar region: Secondary | ICD-10-CM | POA: Diagnosis not present

## 2021-02-06 DIAGNOSIS — M9904 Segmental and somatic dysfunction of sacral region: Secondary | ICD-10-CM | POA: Diagnosis not present

## 2021-02-08 DIAGNOSIS — M9904 Segmental and somatic dysfunction of sacral region: Secondary | ICD-10-CM | POA: Diagnosis not present

## 2021-02-08 DIAGNOSIS — M9901 Segmental and somatic dysfunction of cervical region: Secondary | ICD-10-CM | POA: Diagnosis not present

## 2021-02-08 DIAGNOSIS — M9903 Segmental and somatic dysfunction of lumbar region: Secondary | ICD-10-CM | POA: Diagnosis not present

## 2021-02-08 DIAGNOSIS — M9905 Segmental and somatic dysfunction of pelvic region: Secondary | ICD-10-CM | POA: Diagnosis not present

## 2021-06-29 ENCOUNTER — Encounter: Payer: Self-pay | Admitting: Family

## 2021-06-29 ENCOUNTER — Other Ambulatory Visit: Payer: Self-pay

## 2021-06-29 ENCOUNTER — Ambulatory Visit (INDEPENDENT_AMBULATORY_CARE_PROVIDER_SITE_OTHER): Payer: Medicare Other | Admitting: Family

## 2021-06-29 VITALS — BP 130/80 | HR 85 | Temp 98.6°F | Ht 65.0 in | Wt 119.5 lb

## 2021-06-29 DIAGNOSIS — M5431 Sciatica, right side: Secondary | ICD-10-CM

## 2021-06-29 MED ORDER — PREDNISONE 20 MG PO TABS: ORAL_TABLET | ORAL | 0 refills | Status: DC

## 2021-06-29 NOTE — Patient Instructions (Signed)
It was very nice to see you today!  I have sent Prednisone RX to your pharmacy, start this tomorrow morning. Any worsening of symptoms or they do not resolve after finishing the medication, please call the office.    PLEASE NOTE:  If you had any lab tests please let us know if you have not heard back within a few days. You may see your results on mychart before we have a chance to review them but we will give you a call once they are reviewed by [place]. If we ordered any referrals today, please let us know if you have not heard from their office within the next week.   Please try these tips to maintain a healthy lifestyle:  Eat most of your calories during the day when you are active. Eliminate processed foods including packaged sweets (pies, cakes, cookies), reduce intake of potatoes, white bread, white pasta, and white rice. Look for whole grain options, oat flour or almond flour.  Each meal should contain half fruits/vegetables, one quarter protein, and one quarter carbs (no bigger than a computer mouse).  Cut down on sweet beverages. This includes juice, soda, and sweet tea. Also watch fruit intake, though this is a healthier sweet option, it still contains natural sugar! Limit to 3 servings daily.  Drink at least 1 glass of water with each meal and aim for at least 8 glasses per day  Exercise at least 150 minutes every week.

## 2021-06-29 NOTE — Progress Notes (Signed)
Subjective:     Patient ID: Sheila Wood, female    DOB: 1934/09/27, 86 y.o.   MRN: [id number]  Chief Complaint  Patient presents with   Groin Pain   Leg Pain    Right leg: Pt complains of pain travelling from sciatica down the right leg. She also complains of pain travelling in the groin area.    HPI Pain She reports recurrent Sciatica pain. was not an injury that may have caused the pain. The pain started a few years ago and is  intermittent, gradually worsening . The pain does radiate down right leg. The pain is described as burning and tingling, occurring intermittently. Symptoms are worse in the: evening  Aggravating factors: standing, walking, and walking uphill Relieving factors: NSAIDs .  She has tried application of ice, acetaminophen, and NSAIDs with little relief.   ---------------------------------------------------------------------------------------------------   Health Maintenance Due  Topic Date Due   Pneumonia Vaccine 73+ Years old (1 - PCV) Never done   Zoster Vaccines- Shingrix (1 of 2) Never done    Past Medical History:  Diagnosis Date   History of colonic polyps    Hyperlipidemia    Internal hemorrhoid    Osteoporosis    Shingles     Past Surgical History:  Procedure Laterality Date   CHOLECYSTECTOMY     at time of colectomy   COLONOSCOPY     ESOPHAGOGASTRODUODENOSCOPY     RIGHT COLECTOMY     adenomatous polyp    Outpatient Medications Prior to Visit  Medication Sig Dispense Refill   Acetaminophen (TYLENOL 8 HOUR ARTHRITIS PAIN PO) Take by mouth as needed. (Patient not taking: Reported on 06/29/2021)     famotidine (PEPCID) 20 MG tablet Take 1 tablet (20 mg total) by mouth 2 (two) times daily for 15 days. 30 tablet 0   Vitamin D, Ergocalciferol, (DRISDOL) 1.25 MG (50000 UNIT) CAPS capsule Take 1 capsule (50,000 Units total) by mouth every 7 (seven) days. (Patient not taking: Reported on 06/29/2021) 13 capsule 1   No facility-administered  medications prior to visit.    Allergies  Allergen Reactions   Cannabinoids Hives   Penicillins Rash   Aspirin         Objective:    Physical Exam Vitals and nursing note reviewed.  Constitutional:      Appearance: Normal appearance.  Cardiovascular:     Rate and Rhythm: Normal rate and regular rhythm.  Pulmonary:     Effort: Pulmonary effort is normal.     Breath sounds: Normal breath sounds.  Musculoskeletal:     Lumbar back: No signs of trauma, spasms or tenderness. Decreased range of motion.  Skin:    General: Skin is warm and dry.  Neurological:     Mental Status: She is alert.  Psychiatric:        Mood and Affect: Mood normal.        Behavior: Behavior normal.    BP 130/80   Pulse 85   Temp 98.6 F (37 C)   Ht 5\' 5"  (1.651 m)   Wt 119 lb 7.8 oz (54.2 kg)   LMP  (LMP Unknown)   SpO2 95%   BMI 19.88 kg/m  Wt Readings from Last 3 Encounters:  06/29/21 119 lb 7.8 oz (54.2 kg)  11/21/20 119 lb 6.1 oz (54.2 kg)  10/27/20 117 lb 9.6 oz (53.3 kg)       Assessment & Plan:   Problem List Items Addressed This Visit  Nervous and Auditory   Sciatica of right side - Primary    Pt reports having similar pain in the past, but usually goes away, this pain is staying with her. She reports having to drive in a car frequently during the day for her grandchildren which could be aggravating her sx. Sent prednisone pack, Advised on ice or heat application tid, looking into a car seat cushion to alleviate pressure.       Relevant Medications   predniSONE (DELTASONE) 20 MG tablet    Meds ordered this encounter  Medications   predniSONE (DELTASONE) 20 MG tablet    Sig: Take 2 pills in the morning with breakfast for 3 days, then 1 pill for 2 days    Dispense:  8 tablet    Refill:  0    Order Specific Question:   Supervising Provider    Answer:   ANDY, CAMILLE L [[id number]]

## 2021-07-03 NOTE — Assessment & Plan Note (Signed)
Pt reports having similar pain in the past, but usually goes away, this pain is staying with her. She reports having to drive in a car frequently during the day for her grandchildren which could be aggravating her sx. Sent prednisone pack, Advised on ice or heat application tid, looking into a car seat cushion to alleviate pressure.

## 2021-11-02 ENCOUNTER — Ambulatory Visit: Payer: Medicare Other

## 2022-01-29 ENCOUNTER — Telehealth: Payer: Self-pay | Admitting: Family Medicine

## 2022-01-29 NOTE — Telephone Encounter (Signed)
Pt states she received a call from [place].  Pt declined annual medicare wellness visit. States she has an yearly physical with PCP scheduled for July and will not need the AWV.

## 2022-01-29 NOTE — Telephone Encounter (Signed)
Copied from [place] [id number]. Topic: Medicare AWV >> Jan 29, 2022 11:17 AM Devoria Glassing wrote: Reason for CRM: Left message for patient to schedule Annual Wellness Visit.  Please schedule with Nurse Health Advisor Charlott Rakes, RN at [facility].  Please call [phone] ask for [facility]

## 2022-02-27 ENCOUNTER — Ambulatory Visit (INDEPENDENT_AMBULATORY_CARE_PROVIDER_SITE_OTHER): Payer: Medicare Other | Admitting: Family Medicine

## 2022-02-27 ENCOUNTER — Encounter: Payer: Self-pay | Admitting: Family Medicine

## 2022-02-27 VITALS — BP 124/70 | HR 49 | Temp 97.8°F | Ht 65.0 in | Wt 116.0 lb

## 2022-02-27 DIAGNOSIS — Z23 Encounter for immunization: Secondary | ICD-10-CM

## 2022-02-27 DIAGNOSIS — B0229 Other postherpetic nervous system involvement: Secondary | ICD-10-CM | POA: Diagnosis not present

## 2022-02-27 DIAGNOSIS — Z0000 Encounter for general adult medical examination without abnormal findings: Secondary | ICD-10-CM

## 2022-02-27 DIAGNOSIS — E785 Hyperlipidemia, unspecified: Secondary | ICD-10-CM

## 2022-02-27 DIAGNOSIS — M810 Age-related osteoporosis without current pathological fracture: Secondary | ICD-10-CM | POA: Diagnosis not present

## 2022-02-27 LAB — COMPREHENSIVE METABOLIC PANEL
ALT: 40 U/L — ABNORMAL HIGH (ref 0–35)
AST: 26 U/L (ref 0–37)
Albumin: 4.2 g/dL (ref 3.5–5.2)
Alkaline Phosphatase: 132 U/L — ABNORMAL HIGH (ref 39–117)
BUN: 17 mg/dL (ref 6–23)
CO2: 26 mEq/L (ref 19–32)
Calcium: 9.6 mg/dL (ref 8.4–10.5)
Chloride: 104 mEq/L (ref 96–112)
Creatinine, Ser: 0.69 mg/dL (ref 0.40–1.20)
GFR: 79.08 mL/min (ref >60.00)
Glucose, Bld: 82 mg/dL (ref 70–99)
Potassium: 5 mEq/L (ref 3.5–5.1)
Sodium: 138 mEq/L (ref 135–145)
Total Bilirubin: 0.5 mg/dL (ref 0.2–1.2)
Total Protein: 6.8 g/dL (ref 6.0–8.3)

## 2022-02-27 LAB — CBC WITH DIFFERENTIAL/PLATELET
Basophils Absolute: 0 10*3/uL (ref 0.0–0.1)
Basophils Relative: 0.7 % (ref 0.0–3.0)
Eosinophils Absolute: 0.1 10*3/uL (ref 0.0–0.7)
Eosinophils Relative: 1.1 % (ref 0.0–5.0)
HCT: 40.4 % (ref 36.0–46.0)
Hemoglobin: 13 g/dL (ref 12.0–15.0)
Lymphocytes Relative: 37 % (ref 12.0–46.0)
Lymphs Abs: 1.9 10*3/uL (ref 0.7–4.0)
MCHC: 32.2 g/dL (ref 30.0–36.0)
MCV: 94 fl (ref 78.0–100.0)
Monocytes Absolute: 0.6 10*3/uL (ref 0.1–1.0)
Monocytes Relative: 10.8 % (ref 3.0–12.0)
Neutro Abs: 2.6 10*3/uL (ref 1.4–7.7)
Neutrophils Relative %: 50.4 % (ref 43.0–77.0)
Platelets: 219 10*3/uL (ref 150.0–400.0)
RBC: 4.3 Mil/uL (ref 3.87–5.11)
RDW: 13.5 % (ref 11.5–15.5)
WBC: 5.2 10*3/uL (ref 4.0–10.5)

## 2022-02-27 LAB — LIPID PANEL
Cholesterol: 220 mg/dL — ABNORMAL HIGH (ref 0–200)
HDL: 80.6 mg/dL (ref >39.00)
LDL Cholesterol: 112 mg/dL — ABNORMAL HIGH (ref 0–99)
NonHDL: 139.52
Total CHOL/HDL Ratio: 3
Triglycerides: 136 mg/dL (ref 0.0–149.0)
VLDL: 27.2 mg/dL (ref 0.0–40.0)

## 2022-02-27 MED ORDER — FLUTICASONE PROPIONATE 50 MCG/ACT NA SUSP: 2.0000 | Freq: Every day | NASAL | 3 refills | Status: DC

## 2022-02-27 NOTE — Patient Instructions (Addendum)
We will call you within two weeks about your referral to physical mediciane and rehabilitation. If you do not hear within 2 weeks, give [place] a call.    Schedule dentist and eye doctor follow up   Trial flonase for potential allergies- important to follow up with eye doctor if using this  Schedule your bone density test at check out desk.  - located [address] across the street from [place] - in the basement - you DO NEED an appointment for the bone density tests.   Prevnar 20 today  Consider tetanus shot at pharmacy- let us know if you get this  Please stop by lab before you go If you have mychart- we will send your results within 3 business days of [place] receiving them.  If you do not have mychart- we will call you about results within 5 business days of [place] receiving them.  *please also note that you will see labs on mychart as soon as they post. I will later go in and write notes on them- will say "notes from Dr. Yong Channel"   Recommended follow up: Return in about 1 year (around 02/28/2023) for physical or sooner if needed.Schedule b4 you leave.

## 2022-02-27 NOTE — Progress Notes (Addendum)
Phone [phone]   Subjective:  Patient presents today for their annual physical. Chief complaint-noted.   See problem oriented charting- ROS- full  review of systems was completed and negative except for: continued pain after shingles, watery eyes at times and some redness, constipation, joint pain, tremors, anxiety, clogged ear sensation  The following were reviewed and entered/updated in epic: Past Medical History:  Diagnosis Date   History of colonic polyps    Hyperlipidemia    Internal hemorrhoid    Osteoporosis    Shingles    Patient Active Problem List   Diagnosis Date Noted   Post herpetic neuralgia 12/24/2019    Priority: Medium    Hyperlipidemia 05/05/2007    Priority: Medium    Osteoporosis 05/05/2007    Priority: Medium    Glaucoma 03/12/2016    Priority: Low   History of colonic polyps 05/05/2007    Priority: Low   Sciatica of right side 06/29/2021   Past Surgical History:  Procedure Laterality Date   CHOLECYSTECTOMY     at time of colectomy   COLONOSCOPY     ESOPHAGOGASTRODUODENOSCOPY     RIGHT COLECTOMY     adenomatous polyp    Family History  Problem Relation Age of Onset   Colon cancer Brother    Heart disease Mother        unclear history   Diabetes Sister    Lung cancer Father    Kidney disease Son    Prostate cancer Brother     Medications- reviewed and updated Current Outpatient Medications  Medication Sig Dispense Refill   fluticasone (FLONASE) 50 MCG/ACT nasal spray Place 2 sprays into both nostrils daily. 16 g 3   famotidine (PEPCID) 20 MG tablet Take 1 tablet (20 mg total) by mouth 2 (two) times daily for 15 days. 30 tablet 0   No current facility-administered medications for this visit.    Allergies-reviewed and updated Allergies  Allergen Reactions   Cannabinoids Hives   Penicillins Rash   Aspirin     Social History   Social History Narrative   Widowed in Oct 2014. 1 Son lives with her whom is on dialysis; 3  children (daughter, 2 sons) all live close-sees regularly. 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 Oropharynx normal Neck: no thyromegaly CV: RRR no murmurs rubs or gallops Lungs: CTAB no crackles, wheeze, rhonchi Abdomen: soft/nontender/nondistended/normal bowel sounds. No rebound or guarding.  Ext: no edema Skin: warm, dry, scarring over right shoulder and onto chest where prior shingles case was Neuro: grossly normal, moves all extremities, PERRLA   Assessment and Plan   87 y.o. female presenting for annual physical.  Health Maintenance counseling: 1. Anticipatory guidance: Patient counseled regarding regular dental exams - advised q6 months but costly, eye exams - has fallen off since covid- encouraged to restart,  avoiding smoking and second hand smoke, limiting alcohol to 1 beverage per day- sparing beer with pizza , no  illicit drugs .   2. Risk factor reduction:  Advised patient of need for regular exercise and diet rich and fruits and vegetables to reduce risk of heart attack and stroke.  Exercise- active in the home- cares for herself and her son on dialysis.  Diet/weight management- Has lost 2 pounds from last physical-encouraged at least mild weight gain-consider boost/Ensure between meals- shed prefer to eat  Wt Readings from Last 3 Encounters:  02/27/22 116 lb (52.6 kg)  06/29/21 119 lb 7.8 oz (54.2 kg)  11/21/20 119 lb 6.1 oz (54.2 kg)  3. Immunizations/screenings/ancillary studies-recommended for flu and COVID shot.  Recommended Shingrix at  pharmacy - she wants to chat with pm&r first with ongoing pain. Prevnar 20 recommended. Tdap recommended at pharmacy Immunization History  Administered Date(s) Administered   Influenza Whole 06/05/2010   Influenza, High Dose Seasonal PF 07/01/2017   Moderna Sars-Covid-2 Vaccination 01/02/2020, 01/30/2020, 08/02/2020  4. Cervical cancer screening- past age based screening recommendations 5. Breast cancer screening-  breast exam-prefers occasional self exams-and mammogram-past age based screening recommendations  6. Colon cancer screening - March 2015 with 5-year repeat recommended due to family history and right colectomy 2007 for large polyp-she has declined colonoscopy in the past and declines again today-aware of potential cancer/mortality risk- she declines again today  7. Skin cancer screening- no dermatologist. advised regular sunscreen use. Denies worrisome, changing, or new skin lesions.  8. Birth control/STD check- widowed.  Active with a friend but monogamous . Declines std screening 9. Osteoporosis screening at 71- known osteoporosis from [place] reports.  Did not tolerate calcium and vitamin D.  She has never been on Fosamax or similar per her recollection.  Previously NOT interested in repeat bone density-we did encourage her to restart calcium and vitamin D in the past (she has come back off)- now is ok with at least checking bone density 10. Smoking associated screening - never smoker  Status of chronic or acute concerns   #Postherpetic neuralgia - started in 202 and even saw neurology-unfortunately continues to have pain.  She has tried gabapentin, Cymbalta, Lyrica, nortriptyline, capsaicin, Tylenol arthritis in the past.  We did prescribe tramadol in 2021 visit which didn't help - still with extensive scarring after prior shingles episode - she would like to evaluate further- will refer to PM&R to see if any newer pain management methods/options- I was honest that there may not be  other options but certainly reasonable to trial  #hyperlipidemia S: Medication:none A/P: midlly elevated #s but Past age based recommendations for initiation of statin for primary prevention. Update lipid panel today   #Vitamin D deficiency-in the teens before S: Medication: took vitamin D last year but once prescription ran out she stopped after 6 months of high dose vitmain D A/P: hopefully improved- update vitamin D today. Recommended at least 1000 units a day for maintainence  #Glaucoma-follows up with Dr. Rachael Fee office- encouraged follow up    #watery eyes- we will trial flonase to see if this helps her- as logn as follows up lcosely with eye doctor  Recommended follow up: Return in about 1 year (around 02/28/2023) for physical or sooner if needed.Schedule b4 you leave.  Lab/Order associations: fasting   ICD-10-CM   1. Preventative health care  Z00.00     2. Post herpetic neuralgia  B02.29 Ambulatory referral to Physical Medicine Rehab    3. Age-related osteoporosis without current pathological fracture  M81.0     4. Hyperlipidemia, unspecified hyperlipidemia type  E78.5  Meds ordered this encounter  Medications   fluticasone (FLONASE) 50 MCG/ACT nasal spray    Sig: Place 2 sprays into both nostrils daily.    Dispense:  16 g    Refill:  3   Return precautions advised.  Garret Reddish, MD

## 2022-02-27 NOTE — Addendum Note (Signed)
Addended by: Clyde Lundborg A on: 02/27/2022 09:58 AM   Modules accepted: Orders

## 2022-02-28 ENCOUNTER — Other Ambulatory Visit: Payer: Self-pay

## 2022-02-28 DIAGNOSIS — R7989 Other specified abnormal findings of blood chemistry: Secondary | ICD-10-CM

## 2022-03-06 ENCOUNTER — Telehealth: Payer: Self-pay | Admitting: Family Medicine

## 2022-03-06 ENCOUNTER — Other Ambulatory Visit: Payer: Medicare Other

## 2022-03-06 ENCOUNTER — Encounter: Payer: Self-pay | Admitting: Family Medicine

## 2022-03-06 ENCOUNTER — Ambulatory Visit (INDEPENDENT_AMBULATORY_CARE_PROVIDER_SITE_OTHER): Payer: Medicare Other | Admitting: Family Medicine

## 2022-03-06 VITALS — BP 110/80 | HR 44 | Temp 98.2°F | Ht 65.0 in | Wt 116.4 lb

## 2022-03-06 DIAGNOSIS — L03114 Cellulitis of left upper limb: Secondary | ICD-10-CM

## 2022-03-06 MED ORDER — SULFAMETHOXAZOLE-TRIMETHOPRIM 800-160 MG PO TABS: 1.0000 | ORAL_TABLET | Freq: Two times a day (BID) | ORAL | 0 refills | Status: DC

## 2022-03-06 NOTE — Telephone Encounter (Signed)
Triage Nurse Vanita Ingles called and stated Patient needed to be seen today. Waiting on Triage Note. Patient was scheduled with Dr. Jonni Sanger at 11:00 am.

## 2022-03-06 NOTE — Telephone Encounter (Signed)
Patient called stating she had her prevnar 20 vaccination on 02/27/22 and although she was fine the rest of the week she began experiencing a reaction on 03/05/22. States the site became red, hard and itchy. She described this as a "giant hive" and wanted to know if this was normal. Pt has been sent to triage for further evaluation. She would also like to be updated on lab results taken on 02/27/22.

## 2022-03-06 NOTE — Telephone Encounter (Signed)
Pt saw Dr. Jonni Sanger today.

## 2022-03-06 NOTE — Telephone Encounter (Signed)
Patient Name: Sheila Wood Gender: Female DOB: 03-19-1935 Age: 87 Y 73 M 14 D Return Phone Number: [phone] (Primary), [phone] (Secondary) Address: City/ State/ Zip: [place]  [address] Client [place] [facility] [place] [facility] Provider Garret Reddish- MD Contact Type Call Who Is Calling Patient / Member / Family / Caregiver Call Type Triage / Clinical Relationship To Patient Self Return Phone Number [phone] (Primary) Chief Complaint Immunization Reaction Reason for Call Symptomatic / Request for [place] states she received immunization for prevnar. Site is hard , red, like a large hive. Translation No Nurse Assessment Nurse: Raphael Gibney, RN, Vanita Ingles Date/Time (Eastern Time): 03/06/2022 8:46:06 AM Confirm and document reason for call. If symptomatic, describe symptoms. ---Caller states she received prevnar immunization last Tuesday. Yesterday, the injection site got red, itchy and hard. has a hive around the site. no fever. she can move her arm. Does the patient have any new or worsening symptoms? ---Yes Will a triage be completed? ---Yes Related visit to physician within the last 2 weeks? ---No Does the PT have any chronic conditions? (i.e. diabetes, asthma, this includes High risk factors for pregnancy, etc.) ---No Is this a behavioral health or substance abuse call? ---No Guidelines Guideline Title Affirmed Question Affirmed Notes Nurse Date/Time (Eastern Time) Immunization Reactions [1] Redness or red streak around the injection site AND [2] begins > 48 hours after shot AND [3] no fever (Exception: Red area < 1 inch or 2.5 cm wide.) Raphael Gibney, RN, Vanita Ingles 03/06/2022 8:48:08 AM PLEASE NOTE: All timestamps contained within this report are represented as [place] Standard Time. CONFIDENTIALTY NOTICE: This fax transmission is intended only for the  addressee. It contains information that is legally privileged, confidential or otherwise protected from use or disclosure. If you are not the intended recipient, you are strictly prohibited from reviewing, disclosing, copying using or disseminating any of this information or taking any action in reliance on or regarding this information. If you have received this fax in error, please notify us immediately by telephone so that we can arrange for its return to [place]. Phone: [phone], Toll-Free: [phone], Fax: [phone] Page: 2 of 2 Call Id: [id number] [place]. Time Eilene Ghazi Time) Disposition Final User 03/06/2022 8:57:22 AM Call PCP within 24 Hours Yes Raphael Gibney, RN, Vanita Ingles Final Disposition 03/06/2022 8:57:22 AM Call PCP within 24 Hours Yes Raphael Gibney, RN, Doreatha Lew Disagree/Comply Comply Caller Understands Yes PreDisposition Call Doctor Care Advice Given Per Guideline COLD PACK FOR PAIN OR ITCHING AT VACCINE SITE: * Some people find that a cold pack helps with pain or itching. CALL PCP WITHIN 24 HOURS: * You need to discuss this with your doctor (or NP/PA) within the next 24 hours. * IF OFFICE WILL BE OPEN: Call the office when it opens tomorrow morning. CALL BACK IF: * Fever occurs * You become worse Comments User: Dannielle Burn, RN Date/Time (Eastern Time): 03/06/2022 9:03:02 AM Called office and gave report that pt received her prevnar immunization last Tuesday and her injection site has a big hive around it and it is itchy and hard. Triage outcome call physician within 24 hrs. warm transferred to the office Referrals REFERRED TO PCP OFFICE

## 2022-03-06 NOTE — Patient Instructions (Signed)
Please follow up if symptoms do not improve or as needed.    Please take otc zyrtec '10mg'$  once a night for 7 nights.  You may use calamine or benadryl cream for the itching. Advil may help any soreness. Please take the antibiotics for 7 days.

## 2022-03-06 NOTE — Progress Notes (Signed)
Subjective  CC:  Chief Complaint  Patient presents with   Rash    Pt stated that she seen Dr Yong Channel on 02/27/2022 an had a prev 62 and now she has a red spot on her Lt arm with a lump. It is very itchy   Same day acute visit; PCP not available. New pt to me. Chart reviewed.   HPI: Sheila Wood is a 87 y.o. female who presents to the office today to address the problems listed above in the chief complaint. 87 year old mostly healthy female presents with a red itchy arm after receiving prednisone 20 last week.  I reviewed that note.  She says she did try initially but yesterday noticed redness and itching.  There is a large red spot and minimal soreness.  She does feel a slight headache but otherwise has no systemic symptoms.  No fevers or chills.  Assessment  1. Cellulitis of left upper arm      Plan  Cellulitis + allergic reaction delayed: Education given.  Will cover with antibiotics, Septra DS 1 p.o. twice daily x7 days.  May use topical antihistamine creams and Zyrtec nightly.  She will follow-up if unimproved.  Follow up: As needed Visit date not found  No orders of the defined types were placed in this encounter.  Meds ordered this encounter  Medications   sulfamethoxazole-trimethoprim (BACTRIM DS) 800-160 MG tablet    Sig: Take 1 tablet by mouth 2 (two) times daily.    Dispense:  14 tablet    Refill:  0      I reviewed the patients updated PMH, FH, and SocHx.    Patient Active Problem List   Diagnosis Date Noted   Sciatica of right side 06/29/2021   Post herpetic neuralgia 12/24/2019   Glaucoma 03/12/2016   Hyperlipidemia 05/05/2007   Osteoporosis 05/05/2007   History of colonic polyps 05/05/2007   Current Meds  Medication Sig   sulfamethoxazole-trimethoprim (BACTRIM DS) 800-160 MG tablet Take 1 tablet by mouth 2 (two) times daily.    Allergies: Patient is allergic to cannabinoids, penicillins, and aspirin. Family History: Patient family history includes  Colon cancer in her brother; Diabetes in her sister; Heart disease in her mother; Kidney disease in her son; Lung cancer in her father; Prostate cancer in her brother. Social History:  Patient  reports that she has never smoked. She has never used smokeless tobacco. She reports that she does not currently use alcohol after a past usage of about 2.0 standard drinks of alcohol per week. She reports that she does not use drugs.  Review of Systems: Constitutional: Negative for fever malaise or anorexia Cardiovascular: negative for chest pain Respiratory: negative for SOB or persistent cough Gastrointestinal: negative for abdominal pain  Objective  Vitals: BP 110/80   Pulse (!) 44   Temp 98.2 F (36.8 C)   Ht '5\' 5"'$  (1.651 m)   Wt 116 lb 6.4 oz (52.8 kg)   LMP  (LMP Unknown)   SpO2 97%   BMI 19.37 kg/m  General: no acute distress , A&Ox3  Skin:  Warm, left upper deltoid with approximately 4 inch oval erythematous warm nontender macular rash that is blanchable.  No fluctuance.    Commons side effects, risks, benefits, and alternatives for medications and treatment plan prescribed today were discussed, and the patient expressed understanding of the given instructions. Patient is instructed to call or message via MyChart if he/she has any questions or concerns regarding our treatment plan. No barriers to  understanding were identified. We discussed Red Flag symptoms and signs in detail. Patient expressed understanding regarding what to do in case of urgent or emergency type symptoms.  Medication list was reconciled, printed and provided to the patient in AVS. Patient instructions and summary information was reviewed with the patient as documented in the AVS. This note was prepared with assistance of Dragon voice recognition software. Occasional wrong-word or sound-a-like substitutions may have occurred due to the inherent limitations of voice recognition software  This visit occurred during the  SARS-CoV-2 public health emergency.  Safety protocols were in place, including screening questions prior to the visit, additional usage of staff PPE, and extensive cleaning of exam room while observing appropriate contact time as indicated for disinfecting solutions.

## 2022-03-07 ENCOUNTER — Ambulatory Visit (INDEPENDENT_AMBULATORY_CARE_PROVIDER_SITE_OTHER)
Admission: RE | Admit: 2022-03-07 | Discharge: 2022-03-07 | Disposition: A | Discharge status: Home / Self Care | Payer: Medicare Other | Source: Ambulatory Visit | Attending: Family Medicine | Admitting: Family Medicine

## 2022-03-07 ENCOUNTER — Telehealth: Payer: Self-pay | Admitting: Family Medicine

## 2022-03-07 DIAGNOSIS — M810 Age-related osteoporosis without current pathological fracture: Secondary | ICD-10-CM | POA: Diagnosis not present

## 2022-03-07 NOTE — Telephone Encounter (Signed)
Pt wanted to let provider know she has had a slight temp of 99' and 99.5' for the past 2 days. She took Tylenol today and it seems to be helping. She is asking if she needs to be worried or do anything else. Please advise

## 2022-03-07 NOTE — Telephone Encounter (Signed)
Pt states she is calling for an update about question. FO Rep explained PCP Team will be able to review message after clinical sessions are complete. Pt stated understanding.  Pt declined to speak to the PCP Triage Nurse (on call nurse).

## 2022-03-08 ENCOUNTER — Other Ambulatory Visit: Payer: Self-pay

## 2022-03-08 DIAGNOSIS — R7401 Elevation of levels of liver transaminase levels: Secondary | ICD-10-CM

## 2022-03-08 NOTE — Telephone Encounter (Signed)
How is her arm looking?  Is she still having temperatures in the 99's?  As long as not worsening temperature and arm does not look any worse okay to finish treatment-if worsening schedule follow-up

## 2022-03-08 NOTE — Telephone Encounter (Signed)
See below, pt saw Dr. Jonni Sanger Monday.

## 2022-03-09 NOTE — Telephone Encounter (Signed)
Patient states that her arm is looking better and no temps in 99 range. Aware to return call early next week if symptoms worsen over the weekend.

## 2022-03-12 ENCOUNTER — Encounter: Payer: Self-pay | Admitting: Physical Medicine and Rehabilitation

## 2022-04-09 ENCOUNTER — Other Ambulatory Visit: Payer: Medicare Other

## 2022-04-10 ENCOUNTER — Other Ambulatory Visit (INDEPENDENT_AMBULATORY_CARE_PROVIDER_SITE_OTHER): Payer: Medicare Other

## 2022-04-10 DIAGNOSIS — R7401 Elevation of levels of liver transaminase levels: Secondary | ICD-10-CM | POA: Diagnosis not present

## 2022-04-10 LAB — HEPATIC FUNCTION PANEL
ALT: 162 U/L — ABNORMAL HIGH (ref 0–35)
AST: 101 U/L — ABNORMAL HIGH (ref 0–37)
Albumin: 4 g/dL (ref 3.5–5.2)
Alkaline Phosphatase: 469 U/L — ABNORMAL HIGH (ref 39–117)
Bilirubin, Direct: 0.2 mg/dL (ref 0.0–0.3)
Total Bilirubin: 0.7 mg/dL (ref 0.2–1.2)
Total Protein: 6.9 g/dL (ref 6.0–8.3)

## 2022-04-30 ENCOUNTER — Ambulatory Visit (INDEPENDENT_AMBULATORY_CARE_PROVIDER_SITE_OTHER): Payer: Medicare Other | Admitting: Family Medicine

## 2022-04-30 ENCOUNTER — Encounter: Payer: Self-pay | Admitting: Family Medicine

## 2022-04-30 VITALS — BP 112/70 | HR 72 | Temp 97.9°F | Ht 65.0 in | Wt 117.2 lb

## 2022-04-30 DIAGNOSIS — R7989 Other specified abnormal findings of blood chemistry: Secondary | ICD-10-CM | POA: Diagnosis not present

## 2022-04-30 DIAGNOSIS — R748 Abnormal levels of other serum enzymes: Secondary | ICD-10-CM

## 2022-04-30 DIAGNOSIS — R7401 Elevation of levels of liver transaminase levels: Secondary | ICD-10-CM | POA: Diagnosis not present

## 2022-04-30 NOTE — Patient Instructions (Addendum)
Flu shot (high dose)  we should have these available within a month or two but please let us know if you get at outside pharmacy  Please stop by lab before you go If you have mychart- we will send your results within 3 business days of [place] receiving them.  If you do not have mychart- we will call you about results within 5 business days of [place] receiving them.  *please also note that you will see labs on mychart as soon as they post. I will later go in and write notes on them- will say "notes from Dr. Yong Channel"    Recommended follow up: make sure to schedule an annual visit/physical even if workup ok

## 2022-04-30 NOTE — Progress Notes (Signed)
Phone [phone] In person visit   Subjective:   Sheila Wood is a 87 y.o. year old very pleasant female patient who presents for/with See problem oriented charting Chief Complaint  Patient presents with   Follow-up    Pt is here to f/u on labs.   Past Medical History-  Patient Active Problem List   Diagnosis Date Noted   Post herpetic neuralgia 12/24/2019    Priority: Medium    Hyperlipidemia 05/05/2007    Priority: Medium    Osteoporosis 05/05/2007    Priority: Medium    Glaucoma 03/12/2016    Priority: Low   History of colonic polyps 05/05/2007    Priority: Low   Sciatica of right side 06/29/2021    Medications- reviewed and updated - sparing tylenol - once a week maybe but doesn't help much otc    Objective:  BP 112/70   Pulse 72   Temp 97.9 F (36.6 C)   Ht _0  (1.651 m)   Wt 117 lb 3.2 oz (53.2 kg)   LMP  (LMP Unknown)   SpO2 98%   BMI 19.50 kg/m  Gen: NAD, resting comfortably CV: RRR no murmurs rubs or gallops Lungs: CTAB no crackles, wheeze, rhonchi Abdomen: soft/nontender/nondistended/normal bowel sounds. No rebound or guarding.  Ext: minimal edema Skin: warm, dry, scarring from prior shingles on right chest Neuro: grossly normal, moves all extremities, 5/5 strength BLE    Assessment and Plan   # Elevated LFTs/alkaline phosphatase S:2 months ago had very minor lft ELevations with ALT slightly high as was alk phos- to be on cautious side we had her come back for repeat about 2 weeks ago- tests had further increased -quit regular wine 6 months ago- maybe once a week and now maybe beer once every 2-4 weeks x1. - does not take regular medications other than sparing tylenol- once a week maybe but doesn't help much otc. Did have prior medicine attempts for postherpetic neuralgia but only took most short term- lyrica was 4 months which was the longest- stopped over a year ago -not on statin -does take vitamin D OTC -is not overweight -no history of  hemochromatosis  -has had gallbladder removed Lab Results  Component Value Date   ALT 162 (H) 04/10/2022   AST 101 (H) 04/10/2022   ALKPHOS 469 (H) 04/10/2022   BILITOT 0.7 04/10/2022   A/P: Elevated LFTs/alkaline phosphatase- of unclear origin update labs as below and also opted to get RUQ ultrasound - discussed may need additional blood work or workup depending on all of these result   #Daughter noted when she stands up tends to lean more toward right when walking when just stands up from bed or gets out of car- that was the side of shingles pain and later right sided sciatica- does occasionally get pain- wonder if pain could be related to back/nerve irritation. Once she is stabilized has no recurring issues. Balance not as good as it one was- encouraged her to consider cane to help with balance- also offered PT- she delcines  Recommended follow up: make sure to schedule an annual visit/physical even if workup ok   Lab/Order associations:   ICD-10-CM   1. Elevated transaminase level  R74.01 Comprehensive metabolic panel    Hepatitis B surface antigen    Hepatitis B surface antibody,qualitative    Hepatitis B core antibody, total    Hepatitis C antibody, reflex    IBC + Ferritin    US Abdomen Limited RUQ (LIVER/GB)  2. Elevated alkaline phosphatase level  R74.8 Comprehensive metabolic panel    Gamma GT    US Abdomen Limited RUQ (LIVER/GB)     Time Spent: 20 minutes of total time (1:53 PM- 2:13 PM) was spent on the date of the encounter performing the following actions: chart review prior to seeing the patient, obtaining history, performing a medically necessary exam, counseling on the treatment plan and reasons for workup, placing orders, and documenting in our EHR.   Return precautions advised.  Garret Reddish, MD

## 2022-05-01 ENCOUNTER — Other Ambulatory Visit: Payer: Medicare Other

## 2022-05-01 LAB — HEPATITIS B CORE ANTIBODY, TOTAL: Hep B Core Total Ab: NONREACTIVE

## 2022-05-01 LAB — IBC + FERRITIN
Ferritin: 833.7 ng/mL — ABNORMAL HIGH (ref 10.0–291.0)
Iron: 125 ug/dL (ref 42–145)
Saturation Ratios: 47.5 % (ref 20.0–50.0)
TIBC: 263.2 ug/dL (ref 250.0–450.0)
Transferrin: 188 mg/dL — ABNORMAL LOW (ref 212.0–360.0)

## 2022-05-01 LAB — COMPREHENSIVE METABOLIC PANEL
ALT: 460 U/L — ABNORMAL HIGH (ref 0–35)
AST: 299 U/L — ABNORMAL HIGH (ref 0–37)
Albumin: 3.9 g/dL (ref 3.5–5.2)
Alkaline Phosphatase: 479 U/L — ABNORMAL HIGH (ref 39–117)
BUN: 19 mg/dL (ref 6–23)
CO2: 28 mEq/L (ref 19–32)
Calcium: 9.3 mg/dL (ref 8.4–10.5)
Chloride: 102 mEq/L (ref 96–112)
Creatinine, Ser: 0.75 mg/dL (ref 0.40–1.20)
GFR: 72.46 mL/min (ref >60.00)
Glucose, Bld: 179 mg/dL — ABNORMAL HIGH (ref 70–99)
Potassium: 4.3 mEq/L (ref 3.5–5.1)
Sodium: 139 mEq/L (ref 135–145)
Total Bilirubin: 0.6 mg/dL (ref 0.2–1.2)
Total Protein: 6.5 g/dL (ref 6.0–8.3)

## 2022-05-01 LAB — HEPATITIS B SURFACE ANTIBODY,QUALITATIVE: Hep B S Ab: NONREACTIVE

## 2022-05-01 LAB — HEPATIC FUNCTION PANEL
ALT: 460 U/L — ABNORMAL HIGH (ref 0–35)
AST: 299 U/L — ABNORMAL HIGH (ref 0–37)
Albumin: 3.9 g/dL (ref 3.5–5.2)
Alkaline Phosphatase: 479 U/L — ABNORMAL HIGH (ref 39–117)
Bilirubin, Direct: 0.1 mg/dL (ref 0.0–0.3)
Total Bilirubin: 0.6 mg/dL (ref 0.2–1.2)
Total Protein: 6.5 g/dL (ref 6.0–8.3)

## 2022-05-01 LAB — HEPATITIS B SURFACE ANTIGEN: Hepatitis B Surface Ag: NONREACTIVE

## 2022-05-01 LAB — GAMMA GT: GGT: 519 U/L — ABNORMAL HIGH (ref 7–51)

## 2022-05-02 ENCOUNTER — Encounter: Payer: Medicare Other | Admitting: Physical Medicine and Rehabilitation

## 2022-05-03 ENCOUNTER — Ambulatory Visit
Admission: RE | Admit: 2022-05-03 | Discharge: 2022-05-03 | Disposition: A | Discharge status: Home / Self Care | Payer: Medicare Other | Source: Ambulatory Visit | Attending: Family Medicine | Admitting: Family Medicine

## 2022-05-03 ENCOUNTER — Other Ambulatory Visit: Payer: Self-pay | Admitting: Family Medicine

## 2022-05-03 ENCOUNTER — Telehealth: Payer: Self-pay | Admitting: Family Medicine

## 2022-05-03 DIAGNOSIS — R748 Abnormal levels of other serum enzymes: Secondary | ICD-10-CM

## 2022-05-03 DIAGNOSIS — K838 Other specified diseases of biliary tract: Secondary | ICD-10-CM

## 2022-05-03 DIAGNOSIS — R945 Abnormal results of liver function studies: Secondary | ICD-10-CM | POA: Diagnosis not present

## 2022-05-03 DIAGNOSIS — R7401 Elevation of levels of liver transaminase levels: Secondary | ICD-10-CM

## 2022-05-03 DIAGNOSIS — Z9049 Acquired absence of other specified parts of digestive tract: Secondary | ICD-10-CM | POA: Diagnosis not present

## 2022-05-03 NOTE — Telephone Encounter (Signed)
Patient requests to be called at ph# [phone] to be given lab results from 04/30/22

## 2022-05-03 NOTE — Telephone Encounter (Signed)
Called and spoke with pt and labs reviewed. 

## 2022-05-04 ENCOUNTER — Other Ambulatory Visit (HOSPITAL_COMMUNITY): Payer: Self-pay | Admitting: Family Medicine

## 2022-05-04 ENCOUNTER — Telehealth: Payer: Self-pay

## 2022-05-04 ENCOUNTER — Inpatient Hospital Stay (HOSPITAL_COMMUNITY)
Admission: EM | Admit: 2022-05-04 | Discharge: 2022-05-08 | DRG: 395 | Disposition: A | Discharge status: Home / Self Care | Payer: Medicare Other | Source: Ambulatory Visit | Attending: Internal Medicine | Admitting: Internal Medicine

## 2022-05-04 ENCOUNTER — Other Ambulatory Visit: Payer: Self-pay

## 2022-05-04 ENCOUNTER — Encounter (HOSPITAL_COMMUNITY): Payer: Self-pay | Admitting: Emergency Medicine

## 2022-05-04 ENCOUNTER — Inpatient Hospital Stay (HOSPITAL_COMMUNITY)
Admission: RE | Admit: 2022-05-04 | Discharge: 2022-05-04 | Disposition: A | Discharge status: Home / Self Care | Payer: Medicare Other | Source: Ambulatory Visit | Attending: Family Medicine | Admitting: Family Medicine

## 2022-05-04 DIAGNOSIS — Z880 Allergy status to penicillin: Secondary | ICD-10-CM | POA: Diagnosis not present

## 2022-05-04 DIAGNOSIS — E785 Hyperlipidemia, unspecified: Secondary | ICD-10-CM | POA: Diagnosis not present

## 2022-05-04 DIAGNOSIS — K838 Other specified diseases of biliary tract: Secondary | ICD-10-CM | POA: Diagnosis not present

## 2022-05-04 DIAGNOSIS — Y733 Surgical instruments, materials and gastroenterology and urology devices (including sutures) associated with adverse incidents: Secondary | ICD-10-CM | POA: Diagnosis present

## 2022-05-04 DIAGNOSIS — R7401 Elevation of levels of liver transaminase levels: Secondary | ICD-10-CM | POA: Insufficient documentation

## 2022-05-04 DIAGNOSIS — K3189 Other diseases of stomach and duodenum: Secondary | ICD-10-CM

## 2022-05-04 DIAGNOSIS — Z8249 Family history of ischemic heart disease and other diseases of the circulatory system: Secondary | ICD-10-CM

## 2022-05-04 DIAGNOSIS — K8051 Calculus of bile duct without cholangitis or cholecystitis with obstruction: Secondary | ICD-10-CM | POA: Diagnosis not present

## 2022-05-04 DIAGNOSIS — Z801 Family history of malignant neoplasm of trachea, bronchus and lung: Secondary | ICD-10-CM | POA: Diagnosis not present

## 2022-05-04 DIAGNOSIS — R7989 Other specified abnormal findings of blood chemistry: Secondary | ICD-10-CM

## 2022-05-04 DIAGNOSIS — Z886 Allergy status to analgesic agent status: Secondary | ICD-10-CM

## 2022-05-04 DIAGNOSIS — K2289 Other specified disease of esophagus: Secondary | ICD-10-CM | POA: Diagnosis not present

## 2022-05-04 DIAGNOSIS — K5710 Diverticulosis of small intestine without perforation or abscess without bleeding: Secondary | ICD-10-CM | POA: Diagnosis not present

## 2022-05-04 DIAGNOSIS — K31A19 Gastric intestinal metaplasia without dysplasia, unspecified site: Secondary | ICD-10-CM | POA: Diagnosis not present

## 2022-05-04 DIAGNOSIS — Z9889 Other specified postprocedural states: Secondary | ICD-10-CM

## 2022-05-04 DIAGNOSIS — R933 Abnormal findings on diagnostic imaging of other parts of digestive tract: Secondary | ICD-10-CM | POA: Diagnosis not present

## 2022-05-04 DIAGNOSIS — K8050 Calculus of bile duct without cholangitis or cholecystitis without obstruction: Secondary | ICD-10-CM | POA: Diagnosis present

## 2022-05-04 DIAGNOSIS — Y839 Surgical procedure, unspecified as the cause of abnormal reaction of the patient, or of later complication, without mention of misadventure at the time of the procedure: Secondary | ICD-10-CM | POA: Diagnosis present

## 2022-05-04 DIAGNOSIS — R9431 Abnormal electrocardiogram [ECG] [EKG]: Secondary | ICD-10-CM | POA: Diagnosis not present

## 2022-05-04 DIAGNOSIS — Z833 Family history of diabetes mellitus: Secondary | ICD-10-CM

## 2022-05-04 DIAGNOSIS — Z8042 Family history of malignant neoplasm of prostate: Secondary | ICD-10-CM

## 2022-05-04 DIAGNOSIS — K9186 Retained cholelithiasis following cholecystectomy: Principal | ICD-10-CM | POA: Diagnosis present

## 2022-05-04 DIAGNOSIS — Z800 Family history of malignant neoplasm of digestive organs: Secondary | ICD-10-CM | POA: Diagnosis not present

## 2022-05-04 DIAGNOSIS — R748 Abnormal levels of other serum enzymes: Secondary | ICD-10-CM | POA: Insufficient documentation

## 2022-05-04 DIAGNOSIS — K2970 Gastritis, unspecified, without bleeding: Secondary | ICD-10-CM | POA: Diagnosis not present

## 2022-05-04 DIAGNOSIS — K319 Disease of stomach and duodenum, unspecified: Secondary | ICD-10-CM | POA: Diagnosis not present

## 2022-05-04 DIAGNOSIS — M810 Age-related osteoporosis without current pathological fracture: Secondary | ICD-10-CM | POA: Diagnosis present

## 2022-05-04 LAB — COMPREHENSIVE METABOLIC PANEL
ALT: 298 U/L — ABNORMAL HIGH (ref 0–44)
AST: 160 U/L — ABNORMAL HIGH (ref 15–41)
Albumin: 2.9 g/dL — ABNORMAL LOW (ref 3.5–5.0)
Alkaline Phosphatase: 375 U/L — ABNORMAL HIGH (ref 38–126)
Anion gap: 3 — ABNORMAL LOW (ref 5–15)
BUN: 18 mg/dL (ref 8–23)
CO2: 24 mmol/L (ref 22–32)
Calcium: 8 mg/dL — ABNORMAL LOW (ref 8.9–10.3)
Chloride: 113 mmol/L — ABNORMAL HIGH (ref 98–111)
Creatinine, Ser: 0.6 mg/dL (ref 0.44–1.00)
GFR, Estimated: >60 mL/min (ref >60)
Glucose, Bld: 83 mg/dL (ref 70–99)
Potassium: 3.4 mmol/L — ABNORMAL LOW (ref 3.5–5.1)
Sodium: 140 mmol/L (ref 135–145)
Total Bilirubin: 0.7 mg/dL (ref 0.3–1.2)
Total Protein: 5.1 g/dL — ABNORMAL LOW (ref 6.5–8.1)

## 2022-05-04 LAB — CBC WITH DIFFERENTIAL/PLATELET
Abs Immature Granulocytes: 0.02 10*3/uL (ref 0.00–0.07)
Basophils Absolute: 0 10*3/uL (ref 0.0–0.1)
Basophils Relative: 1 %
Eosinophils Absolute: 0.1 10*3/uL (ref 0.0–0.5)
Eosinophils Relative: 2 %
HCT: 40.1 % (ref 36.0–46.0)
Hemoglobin: 13.3 g/dL (ref 12.0–15.0)
Immature Granulocytes: 0 %
Lymphocytes Relative: 40 %
Lymphs Abs: 2.4 10*3/uL (ref 0.7–4.0)
MCH: 31.4 pg (ref 26.0–34.0)
MCHC: 33.2 g/dL (ref 30.0–36.0)
MCV: 94.8 fL (ref 80.0–100.0)
Monocytes Absolute: 0.6 10*3/uL (ref 0.1–1.0)
Monocytes Relative: 10 %
Neutro Abs: 2.8 10*3/uL (ref 1.7–7.7)
Neutrophils Relative %: 47 %
Platelets: 215 10*3/uL (ref 150–400)
RBC: 4.23 MIL/uL (ref 3.87–5.11)
RDW: 13.6 % (ref 11.5–15.5)
WBC: 5.9 10*3/uL (ref 4.0–10.5)
nRBC: 0 % (ref 0.0–0.2)

## 2022-05-04 LAB — ACETAMINOPHEN LEVEL: Acetaminophen (Tylenol), Serum: <10 ug/mL — ABNORMAL LOW (ref 10–30)

## 2022-05-04 LAB — ETHANOL: Alcohol, Ethyl (B): <10 mg/dL (ref <10)

## 2022-05-04 LAB — LIPASE, BLOOD: Lipase: 41 U/L (ref 11–51)

## 2022-05-04 MED ORDER — ONDANSETRON HCL 4 MG/2ML IJ SOLN: 4.0000 mg | Freq: Four times a day (QID) | INTRAMUSCULAR | Status: DC | PRN

## 2022-05-04 MED ORDER — MORPHINE SULFATE (PF) 2 MG/ML IV SOLN
1.0000 mg | INTRAVENOUS | Status: AC | PRN
  Administered 2022-05-06 – 2022-05-07 (×2): 1 mg via INTRAVENOUS
  Filled 2022-05-04 (×2): qty 1

## 2022-05-04 MED ORDER — LACTATED RINGERS IV BOLUS
1000.0000 mL | Freq: Once | INTRAVENOUS | Status: AC
  Administered 2022-05-04: 1000 mL via INTRAVENOUS

## 2022-05-04 MED ORDER — ENOXAPARIN SODIUM 40 MG/0.4ML IJ SOSY: 40.0000 mg | PREFILLED_SYRINGE | INTRAMUSCULAR | Status: DC

## 2022-05-04 MED ORDER — LACTATED RINGERS IV SOLN: INTRAVENOUS | Status: AC

## 2022-05-04 MED ORDER — GADOBUTROL 1 MMOL/ML IV SOLN
5.0000 mL | Freq: Once | INTRAVENOUS | Status: AC | PRN
  Administered 2022-05-04: 5 mL via INTRAVENOUS

## 2022-05-04 MED ORDER — POTASSIUM CHLORIDE 20 MEQ PO PACK
40.0000 meq | PACK | Freq: Once | ORAL | Status: AC
  Administered 2022-05-05: 40 meq via ORAL
  Filled 2022-05-04: qty 2

## 2022-05-04 MED ORDER — ONDANSETRON HCL 4 MG PO TABS: 4.0000 mg | ORAL_TABLET | Freq: Four times a day (QID) | ORAL | Status: DC | PRN

## 2022-05-04 NOTE — ED Provider Notes (Signed)
[place] [place] DEPT Provider Note   CSN: [id number] Arrival date & time: 05/04/22  1832     History  Chief Complaint  Patient presents with   Abnormal Lab    Sheila Wood is a 87 y.o. female.  Patient sent in by PCP with abnormal LFTs and positive findings on MRCP of choledocholithiasis.  She has been dealing with elevated LFTs for the past several weeks that were discovered incidentally during a PCP visit.  Work-up has included ultrasound as well as MRCP today.  States she has had a previous cholecystectomy.  Denies any abdominal pain, fever, nausea or vomiting.  Good p.o. intake and urine output.  Has noticed some light-colored stools for the past several days.  Denies constipation or diarrhea.  Denies chest pain or shortness of breath.  Denies cough, runny nose or sore throat. Denies excessive Tylenol consumption or alcohol consumption. Outpatient MRCP today showed choledocholithiasis with biliary duct dilation.  The history is provided by the patient.  Abnormal Lab      Home Medications Prior to Admission medications   Not on File      Allergies    Cannabinoids, Penicillins, and Aspirin    Review of Systems   Review of Systems  Constitutional:  Negative for activity change, appetite change and fever.  Respiratory:  Negative for cough, chest tightness and shortness of breath.   Gastrointestinal:  Negative for abdominal pain, nausea and vomiting.  Genitourinary:  Negative for dysuria.  Musculoskeletal:  Negative for arthralgias and myalgias.  Neurological:  Negative for weakness and headaches.   all other systems are negative except as noted in the HPI and PMH.    Physical Exam Updated Vital Signs BP 138/80 (BP Location: Left Arm)   Pulse 62   Temp 97.7 F (36.5 C) (Oral)   Resp 18   LMP  (LMP Unknown)   SpO2 97%  Physical Exam Vitals and nursing note reviewed.  Constitutional:      General: She is not in acute distress.     Appearance: She is well-developed.  HENT:     Head: Normocephalic and atraumatic.     Mouth/Throat:     Pharynx: No oropharyngeal exudate.  Eyes:     Conjunctiva/sclera: Conjunctivae normal.     Pupils: Pupils are equal, round, and reactive to light.  Neck:     Comments: No meningismus. Cardiovascular:     Rate and Rhythm: Normal rate and regular rhythm.     Heart sounds: Normal heart sounds. No murmur heard. Pulmonary:     Effort: Pulmonary effort is normal. No respiratory distress.     Breath sounds: Normal breath sounds.  Abdominal:     Palpations: Abdomen is soft.     Tenderness: There is no abdominal tenderness. There is no guarding or rebound.  Musculoskeletal:        General: No tenderness. Normal range of motion.     Cervical back: Normal range of motion and neck supple.  Skin:    General: Skin is warm.  Neurological:     Mental Status: She is alert and oriented to person, place, and time.     Cranial Nerves: No cranial nerve deficit.     Motor: No abnormal muscle tone.     Coordination: Coordination normal.     Comments:  5/5 strength throughout. CN 2-12 intact.Equal grip strength.   Psychiatric:        Behavior: Behavior normal.     ED Results / Procedures /  Treatments   Labs (all labs ordered are listed, but only abnormal results are displayed) Labs Reviewed  ACETAMINOPHEN LEVEL - Abnormal; Notable for the following components:      Result Value   Acetaminophen (Tylenol), Serum <10 (*)    All other components within normal limits  COMPREHENSIVE METABOLIC PANEL - Abnormal; Notable for the following components:   Potassium 3.4 (*)    Chloride 113 (*)    Calcium 8.0 (*)    Total Protein 5.1 (*)    Albumin 2.9 (*)    AST 160 (*)    ALT 298 (*)    Alkaline Phosphatase 375 (*)    Anion gap 3 (*)    All other components within normal limits  CULTURE, BLOOD (ROUTINE X 2)  CULTURE, BLOOD (ROUTINE X 2)  CBC WITH DIFFERENTIAL/PLATELET  ETHANOL  LIPASE, BLOOD   URINALYSIS, ROUTINE W REFLEX MICROSCOPIC  COMPREHENSIVE METABOLIC PANEL  CBC    EKG EKG Interpretation  Date/Time:  Friday May 04 2022 21:43:23 EDT Ventricular Rate:  65 PR Interval:  165 QRS Duration: 83 QT Interval:  415 QTC Calculation: 432 R Axis:   40 Text Interpretation: Sinus rhythm No significant change was found Confirmed by Ezequiel Essex [phone]) on 05/04/2022 9:44:36 PM  Radiology MR ABDOMEN MRCP W WO CONTAST  Result Date: 05/04/2022 CLINICAL DATA:  Elevated transaminase level. Elevated alkaline phosphatase level. Common bile duct dilation on ultrasound. EXAM: MRI ABDOMEN WITHOUT AND WITH CONTRAST (INCLUDING MRCP) TECHNIQUE: Multiplanar multisequence MR imaging of the abdomen was performed both before and after the administration of intravenous contrast. Heavily T2-weighted images of the biliary and pancreatic ducts were obtained, and three-dimensional MRCP images were rendered by post processing. CONTRAST:  86m GADAVIST GADOBUTROL 1 MMOL/ML IV SOLN COMPARISON:  Ultrasound May 03, 2022. FINDINGS: Lower chest: No acute abnormality. Hepatobiliary: No significant hepatic steatosis. Scattered hepatic cysts. Gallbladder surgically absent. Dilation of the intra and extrahepatic biliary tree with the common bile duct measuring 15 mm in maximum diameter and a 12 mm choledocholithiasis at the ampulla. Very mild peribiliary enhancement may be reactive or reflect ascending infection. Pancreas: Pancreatic divisum, anatomic variant. No pancreatic ductal dilation or evidence of acute inflammation. Spleen:  No splenomegaly or focal splenic lesion. Adrenals/Urinary Tract: Bilateral adrenal glands appear normal. No hydronephrosis. No solid enhancing renal mass. Stomach/Bowel: No pathologic dilation or evidence of acute inflammation involving loops of large or small bowel in the abdomen. Vascular/Lymphatic: No pathologically enlarged lymph nodes identified. No abdominal aortic aneurysm  demonstrated. Other:  None. Musculoskeletal: No suspicious bone lesions identified. IMPRESSION: 1. Biliary obstruction with intra and extrahepatic biliary dilation secondary to a 12 mm choledocholithiasis at the ampulla. Additionally, there is very mild peribiliary enhancement which may be reactive or reflect ascending cholangitis, clinical and laboratory assessment suggested. 2. Cholecystectomy. These results will be called to the ordering clinician or representative by the Radiologist Assistant, and communication documented in the PACS or [employer] Electronically Signed   By: JDahlia BailiffM.D.   On: 05/04/2022 14:19   MR 3D Recon At Scanner  Result Date: 05/04/2022 CLINICAL DATA:  Elevated transaminase level. Elevated alkaline phosphatase level. Common bile duct dilation on ultrasound. EXAM: MRI ABDOMEN WITHOUT AND WITH CONTRAST (INCLUDING MRCP) TECHNIQUE: Multiplanar multisequence MR imaging of the abdomen was performed both before and after the administration of intravenous contrast. Heavily T2-weighted images of the biliary and pancreatic ducts were obtained, and three-dimensional MRCP images were rendered by post processing. CONTRAST:  574mGADAVIST GADOBUTROL 1 MMOL/ML  IV SOLN COMPARISON:  Ultrasound May 03, 2022. FINDINGS: Lower chest: No acute abnormality. Hepatobiliary: No significant hepatic steatosis. Scattered hepatic cysts. Gallbladder surgically absent. Dilation of the intra and extrahepatic biliary tree with the common bile duct measuring 15 mm in maximum diameter and a 12 mm choledocholithiasis at the ampulla. Very mild peribiliary enhancement may be reactive or reflect ascending infection. Pancreas: Pancreatic divisum, anatomic variant. No pancreatic ductal dilation or evidence of acute inflammation. Spleen:  No splenomegaly or focal splenic lesion. Adrenals/Urinary Tract: Bilateral adrenal glands appear normal. No hydronephrosis. No solid enhancing renal mass. Stomach/Bowel: No  pathologic dilation or evidence of acute inflammation involving loops of large or small bowel in the abdomen. Vascular/Lymphatic: No pathologically enlarged lymph nodes identified. No abdominal aortic aneurysm demonstrated. Other:  None. Musculoskeletal: No suspicious bone lesions identified. IMPRESSION: 1. Biliary obstruction with intra and extrahepatic biliary dilation secondary to a 12 mm choledocholithiasis at the ampulla. Additionally, there is very mild peribiliary enhancement which may be reactive or reflect ascending cholangitis, clinical and laboratory assessment suggested. 2. Cholecystectomy. These results will be called to the ordering clinician or representative by the Radiologist Assistant, and communication documented in the PACS or [employer]. Electronically Signed   By: Dahlia Bailiff M.D.   On: 05/04/2022 14:19   US Abdomen Limited RUQ (LIVER/GB)  Result Date: 05/03/2022 CLINICAL DATA:  Elevated LFTs EXAM: ULTRASOUND ABDOMEN LIMITED RIGHT UPPER QUADRANT COMPARISON:  None Available. FINDINGS: Gallbladder: Surgically absent Common bile duct: Diameter: Dilated measuring 12 mm Liver: Increased echogenicity. No focal lesion. Portal vein is patent on color Doppler imaging with normal direction of blood flow towards the liver. Other: None. IMPRESSION: Prior cholecystectomy. Common bile duct is dilated measuring 12 mm. In the setting of abnormal LFTs, recommend further evaluation with MRI/MRCP. Increased hepatic parenchymal echogenicity suggestive of steatosis. Electronically Signed   By: Lovey Newcomer M.D.   On: 05/03/2022 11:18    Procedures Procedures    Medications Ordered in ED Medications  lactated ringers bolus 1,000 mL (has no administration in time range)    ED Course/ Medical Decision Making/ A&P                           Medical Decision Making Amount and/or Complexity of Data Reviewed Labs: ordered. Decision-making details documented in ED Course. Radiology: ordered and  independent interpretation performed. Decision-making details documented in ED Course. ECG/medicine tests: ordered and independent interpretation performed. Decision-making details documented in ED Course.  Risk Decision regarding hospitalization.  Sent by PCP with elevated LFTs and choledocholithiasis and outpatient MRCP.  Denies abdominal pain, fever or cough.  PCP already discussed with [facility] gastro[facility]ology.  Discussed with Dr. Henrene Pastor who agrees with hospitalist admission for ERCP in the morning.  Agrees to withhold antibiotics at this time given her lack of fever and leukocytosis.  LFTs actually improved from earlier this month.  No leukocytosis.  No fever.  Low suspicion of cholangitis we will hold antibiotics at this time.  Dr. Henrene Pastor in agreement.  Patient tolerating p.o. with no pain.  Agrees to ERCP in the morning.  Discussed with Dr. Posey Pronto who will admit overnight.        Final Clinical Impression(s) / ED Diagnoses Final diagnoses:  Choledocholithiasis    Rx / DC Orders ED Discharge Orders     None         Raif Chachere, Annie Main, MD 05/04/22 2338

## 2022-05-04 NOTE — ED Triage Notes (Signed)
Patient reports MRI this morning showing biliary obstruction and sent for surgery. Reports increasing LFT. Denies pain.

## 2022-05-04 NOTE — Telephone Encounter (Signed)
Stacy from Radiology called and wanted to make sure we could see the report, pt has a biliary obstrcution.

## 2022-05-04 NOTE — ED Notes (Signed)
ED TO INPATIENT HANDOFF REPORT  Name/Age/Gender Sheila Wood 87 y.o. female  Code Status   Home/SNF/Other Home  Chief Complaint Choledocholithiasis with obstruction [K80.51]  Level of Care/Admitting Diagnosis ED Disposition     ED Disposition  Admit   Condition  --   Comment  Hospital Area: [facility] [[id number]]  Level of Care: Med-Surg [16]  May place patient in observation at [facility] or [facility] if equivalent level of care is available:: No  Covid Evaluation: Asymptomatic - no recent exposure (last 10 days) testing not required  Diagnosis: Choledocholithiasis with obstruction [097353]  Admitting Physician: Lenore Cordia [[id number]]  Attending Physician: Lenore Cordia [[id number]]          Medical History Past Medical History:  Diagnosis Date   History of colonic polyps    Hyperlipidemia    Internal hemorrhoid    Osteoporosis    Shingles     Allergies Allergies  Allergen Reactions   Cannabinoids Hives   Penicillins Rash   Aspirin     IV Location/Drains/Wounds Patient Lines/Drains/Airways Status     Active Line/Drains/Airways     Name Placement date Placement time Site Days   Peripheral IV 05/04/22 20 G Right Antecubital 05/04/22  2131  Antecubital  less than 1            Labs/Imaging Results for orders placed or performed during the hospital encounter of 05/04/22 (from the past 48 hour(s))  CBC with Differential     Status: None   Collection Time: 05/04/22  9:10 PM  Result Value Ref Range   WBC 5.9 4.0 - 10.5 K/uL   RBC 4.23 3.87 - 5.11 MIL/uL   Hemoglobin 13.3 12.0 - 15.0 g/dL   HCT 40.1 36.0 - 46.0 %   MCV 94.8 80.0 - 100.0 fL   MCH 31.4 26.0 - 34.0 pg   MCHC 33.2 30.0 - 36.0 g/dL   RDW 13.6 11.5 - 15.5 %   Platelets 215 150 - 400 K/uL   nRBC 0.0 0.0 - 0.2 %   Neutrophils Relative % 47 %   Neutro Abs 2.8 1.7 - 7.7 K/uL   Lymphocytes Relative 40 %   Lymphs Abs 2.4 0.7 - 4.0 K/uL   Monocytes Relative 10 %    Monocytes Absolute 0.6 0.1 - 1.0 K/uL   Eosinophils Relative 2 %   Eosinophils Absolute 0.1 0.0 - 0.5 K/uL   Basophils Relative 1 %   Basophils Absolute 0.0 0.0 - 0.1 K/uL   Immature Granulocytes 0 %   Abs Immature Granulocytes 0.02 0.00 - 0.07 K/uL    Comment: Performed at [facility], [place] [address] [place], [place] [address]  Acetaminophen level     Status: Abnormal   Collection Time: 05/04/22  9:10 PM  Result Value Ref Range   Acetaminophen (Tylenol), Serum <10 (L) 10 - 30 ug/mL    Comment: (NOTE) Therapeutic concentrations vary significantly. A range of 10-30 ug/mL  may be an effective concentration for many patients. However, some  are best treated at concentrations outside of this range. Acetaminophen concentrations >150 ug/mL at 4 hours after ingestion  and >50 ug/mL at 12 hours after ingestion are often associated with  toxic reactions.  Performed at [facility], [place] [address], [place], [place] [address]   Ethanol     Status: None   Collection Time: 05/04/22  9:10 PM  Result Value Ref Range   Alcohol, Ethyl (B) <10 <10 mg/dL  Comment: (NOTE) Lowest detectable limit for serum alcohol is 10 mg/dL.  For medical purposes only. Performed at [facility], [place] [address] [place], [place] [address]    MR ABDOMEN MRCP W WO CONTAST  Result Date: 05/04/2022 CLINICAL DATA:  Elevated transaminase level. Elevated alkaline phosphatase level. Common bile duct dilation on ultrasound. EXAM: MRI ABDOMEN WITHOUT AND WITH CONTRAST (INCLUDING MRCP) TECHNIQUE: Multiplanar multisequence MR imaging of the abdomen was performed both before and after the administration of intravenous contrast. Heavily T2-weighted images of the biliary and pancreatic ducts were obtained, and three-dimensional MRCP images were rendered by post processing. CONTRAST:  36m GADAVIST GADOBUTROL 1 MMOL/ML IV SOLN COMPARISON:  Ultrasound May 03, 2022.  FINDINGS: Lower chest: No acute abnormality. Hepatobiliary: No significant hepatic steatosis. Scattered hepatic cysts. Gallbladder surgically absent. Dilation of the intra and extrahepatic biliary tree with the common bile duct measuring 15 mm in maximum diameter and a 12 mm choledocholithiasis at the ampulla. Very mild peribiliary enhancement may be reactive or reflect ascending infection. Pancreas: Pancreatic divisum, anatomic variant. No pancreatic ductal dilation or evidence of acute inflammation. Spleen:  No splenomegaly or focal splenic lesion. Adrenals/Urinary Tract: Bilateral adrenal glands appear normal. No hydronephrosis. No solid enhancing renal mass. Stomach/Bowel: No pathologic dilation or evidence of acute inflammation involving loops of large or small bowel in the abdomen. Vascular/Lymphatic: No pathologically enlarged lymph nodes identified. No abdominal aortic aneurysm demonstrated. Other:  None. Musculoskeletal: No suspicious bone lesions identified. IMPRESSION: 1. Biliary obstruction with intra and extrahepatic biliary dilation secondary to a 12 mm choledocholithiasis at the ampulla. Additionally, there is very mild peribiliary enhancement which may be reactive or reflect ascending cholangitis, clinical and laboratory assessment suggested. 2. Cholecystectomy. These results will be called to the ordering clinician or representative by the Radiologist Assistant, and communication documented in the PACS or [employer] Electronically Signed   By: JDahlia BailiffM.D.   On: 05/04/2022 14:19   MR 3D Recon At Scanner  Result Date: 05/04/2022 CLINICAL DATA:  Elevated transaminase level. Elevated alkaline phosphatase level. Common bile duct dilation on ultrasound. EXAM: MRI ABDOMEN WITHOUT AND WITH CONTRAST (INCLUDING MRCP) TECHNIQUE: Multiplanar multisequence MR imaging of the abdomen was performed both before and after the administration of intravenous contrast. Heavily T2-weighted images of the  biliary and pancreatic ducts were obtained, and three-dimensional MRCP images were rendered by post processing. CONTRAST:  566mGADAVIST GADOBUTROL 1 MMOL/ML IV SOLN COMPARISON:  Ultrasound May 03, 2022. FINDINGS: Lower chest: No acute abnormality. Hepatobiliary: No significant hepatic steatosis. Scattered hepatic cysts. Gallbladder surgically absent. Dilation of the intra and extrahepatic biliary tree with the common bile duct measuring 15 mm in maximum diameter and a 12 mm choledocholithiasis at the ampulla. Very mild peribiliary enhancement may be reactive or reflect ascending infection. Pancreas: Pancreatic divisum, anatomic variant. No pancreatic ductal dilation or evidence of acute inflammation. Spleen:  No splenomegaly or focal splenic lesion. Adrenals/Urinary Tract: Bilateral adrenal glands appear normal. No hydronephrosis. No solid enhancing renal mass. Stomach/Bowel: No pathologic dilation or evidence of acute inflammation involving loops of large or small bowel in the abdomen. Vascular/Lymphatic: No pathologically enlarged lymph nodes identified. No abdominal aortic aneurysm demonstrated. Other:  None. Musculoskeletal: No suspicious bone lesions identified. IMPRESSION: 1. Biliary obstruction with intra and extrahepatic biliary dilation secondary to a 12 mm choledocholithiasis at the ampulla. Additionally, there is very mild peribiliary enhancement which may be reactive or reflect ascending cholangitis, clinical and laboratory assessment suggested. 2. Cholecystectomy. These results will be called  to the ordering clinician or representative by the Radiologist Assistant, and communication documented in the PACS or [employer]. Electronically Signed   By: Dahlia Bailiff M.D.   On: 05/04/2022 14:19   US Abdomen Limited RUQ (LIVER/GB)  Result Date: 05/03/2022 CLINICAL DATA:  Elevated LFTs EXAM: ULTRASOUND ABDOMEN LIMITED RIGHT UPPER QUADRANT COMPARISON:  None Available. FINDINGS: Gallbladder:  Surgically absent Common bile duct: Diameter: Dilated measuring 12 mm Liver: Increased echogenicity. No focal lesion. Portal vein is patent on color Doppler imaging with normal direction of blood flow towards the liver. Other: None. IMPRESSION: Prior cholecystectomy. Common bile duct is dilated measuring 12 mm. In the setting of abnormal LFTs, recommend further evaluation with MRI/MRCP. Increased hepatic parenchymal echogenicity suggestive of steatosis. Electronically Signed   By: Lovey Newcomer M.D.   On: 05/03/2022 11:18    Pending Labs Unresulted Labs (From admission, onward)     Start     Ordered   05/04/22 2155  Comprehensive metabolic panel  Once,   R        05/04/22 2155   05/04/22 2155  Lipase, blood  Once,   R        05/04/22 2155   05/04/22 2113  Urinalysis, Routine w reflex microscopic  Once,   URGENT        05/04/22 2113   05/04/22 2113  Blood culture (routine x 2)  BLOOD CULTURE X 2,   R (with STAT occurrences)      05/04/22 2113            Vitals/Pain Today's Vitals   05/04/22 1942 05/04/22 1943 05/04/22 2107 05/04/22 2145  BP:  (!) 150/88 138/80 131/76  Pulse:  68 62 69  Resp:  '18 18 19  '$ Temp:  98.8 F (37.1 C) 97.7 F (36.5 C)   TempSrc:  Oral Oral   SpO2:  97% 97% 98%  PainSc: 0-No pain       Isolation Precautions No active isolations  Medications Medications  lactated ringers bolus 1,000 mL (1,000 mLs Intravenous New Bag/Given 05/04/22 2131)    Mobility walks

## 2022-05-04 NOTE — Assessment & Plan Note (Addendum)
Outpatient MRCP showed biliary obstruction secondary to 12 mm choledocholithiasis at the ampulla as cause of her elevated LFTs.  Very mild peribiliary enhancement is likely reactive.  Doubt cholangitis without fever, abdominal pain, or leukocytosis.  No indication for antibiotics at this time. -[facility] to consult for ERCP -Keep n.p.o. -IV fluids overnight while n.p.o. -Antiemetics and analgesics as needed

## 2022-05-04 NOTE — Telephone Encounter (Signed)
Patient with 12 mm stone obstructing biliary tree-I messaged Dr. Carlean Purl her gastroenterologist to see if she could wait for urgent visit next week or needed to go to emergency department now-he recommended emergency department as she needs an ERCP-I called patient and she agrees to go immediately to the hospital-recommended [facility]

## 2022-05-04 NOTE — H&P (Signed)
History and Physical    Sheila Wood ZTI:[id number] DOB: [birth year] DOA: 05/04/2022  PCP: Marin Olp, MD  Patient coming from: Home  I have personally briefly reviewed patient's old medical records in [place]  Chief Complaint: Choledocholithiasis  HPI: Sheila Wood is a 87 y.o. female with medical history significant for HLD, s/p cholecystectomy 2007 who presented to the ED for evaluation of choledocholithiasis.  Patient has been evaluated on an outpatient basis for elevated AST, ALT, and alkaline phosphatase since last month.  Repeat labs on 9/11 showed AST 299, ALT 460, alk phos 479, total bilirubin 0.6.  RUQ ultrasound 9/14 showed CBD dilated measuring 12 mm, prior cholecystectomy changes.  MRCP obtained today 9/15 showed biliary obstruction with intra and extrahepatic biliary dilation secondary to a 12 mm choledocholithiasis at the ampulla.  Very mild peribiliary enhancement also noted.    Her PCP spoke with GI, Dr. Carlean Purl, who recommended proceeding to the ED to be admitted for ERCP.  Patient states that she has not really had any abdominal pain.  She has not had any nausea or vomiting.  She reports some soft light-colored stool but no diarrhea.  She denies any subjective fevers, chills, diaphoresis, chest pain, dyspnea.  She is not taking any medications regularly.  ED Course  Labs/Imaging on admission: I have personally reviewed following labs and imaging studies.  Initial vital showed BP 150/88, pulse 68, RR 18, temp 98.8 F, SPO2 97% on room air.  Labs show WBC 5.9, hemoglobin 13.3, platelets 215,000, sodium 140, potassium 3.4, bicarb 24, BUN 18, creatinine 0.60, serum glucose 83, AST 160, ALT 298, alk phos 375, total bilirubin 0.7, lipase 41.  Blood cultures ordered and pending.  Serum ethanol and acetaminophen levels undetectable.  Patient was given 1 L LR.  EDP discussed with on-call GI, Dr. Henrene Pastor, who recommends admission for ERCP in the morning.  The  hospitalist service was consulted to admit for further evaluation and management.  Review of Systems: All systems reviewed and are negative except as documented in history of present illness above.   Past Medical History:  Diagnosis Date   History of colonic polyps    Hyperlipidemia    Internal hemorrhoid    Osteoporosis    Shingles     Past Surgical History:  Procedure Laterality Date   CHOLECYSTECTOMY     at time of colectomy   COLONOSCOPY     ESOPHAGOGASTRODUODENOSCOPY     RIGHT COLECTOMY     adenomatous polyp    Social History:  reports that she has never smoked. She has never used smokeless tobacco. She reports that she does not currently use alcohol after a past usage of about 2.0 standard drinks of alcohol per week. She reports that she does not use drugs.  Allergies  Allergen Reactions   Cannabinoids Hives   Penicillins Rash    Did it involve swelling of the face/tongue/throat, SOB, or low BP? No Did it involve sudden or severe rash/hives, skin peeling, or any reaction on the inside of your mouth or nose? Yes Did you need to seek medical attention at a hospital or doctor's office? No When did it last happen?  Several Years Ago     If all above answers are "NO", may proceed with cephalosporin use.     Aspirin     Family History  Problem Relation Age of Onset   Colon cancer Brother    Heart disease Mother        unclear history  Diabetes Sister    Lung cancer Father    Kidney disease Son    Prostate cancer Brother      Prior to Admission medications   Not on File    Physical Exam: Vitals:   05/04/22 2145 05/04/22 2215 05/04/22 2230 05/04/22 2325  BP: 131/76 129/67 (!) 165/61 (!) 148/70  Pulse: 69 62 (!) 55 63  Resp: _0 Temp:    97.8 F (36.6 C)  TempSrc:    Oral  SpO2: 98% 98% 100% 97%   Constitutional: Resting in bed, NAD, calm, comfortable Eyes: EOMI, lids and conjunctivae normal ENMT: Mucous membranes are moist. Posterior  pharynx clear of any exudate or lesions.Normal dentition.  Neck: normal, supple, no masses. Respiratory: clear to auscultation bilaterally, no wheezing, no crackles. Normal respiratory effort. No accessory muscle use.  Cardiovascular: Regular rate and rhythm, no murmurs / rubs / gallops. No extremity edema. 2+ pedal pulses. Abdomen: no tenderness, no masses palpated. Musculoskeletal: no clubbing / cyanosis. No joint deformity upper and lower extremities. Good ROM, no contractures. Normal muscle tone.  Skin: no rashes, lesions, ulcers. No induration Neurologic:  Sensation intact. Strength 5/5 in all 4.  Psychiatric: Normal judgment and insight. Alert and oriented x 3. Normal mood.   EKG: Personally reviewed. Normal sinus rhythm, rate 65, no acute ischemic changes.  Assessment/Plan Principal Problem:   Choledocholithiasis with obstruction   [name] is a 87 y.o. female with medical history significant for HLD, s/p cholecystectomy 2007 who is admitted for management of obstructive choledocholithiasis.  Assessment and Plan: * Choledocholithiasis with obstruction Outpatient MRCP showed biliary obstruction secondary to 12 mm choledocholithiasis at the ampulla as cause of her elevated LFTs.  Very mild peribiliary enhancement is likely reactive.  Doubt cholangitis without fever, abdominal pain, or leukocytosis.  No indication for antibiotics at this time. -[facility] to consult for ERCP -Keep n.p.o. -IV fluids overnight while n.p.o. -Antiemetics and analgesics as needed  DVT prophylaxis: enoxaparin (LOVENOX) injection 40 mg Start: 05/05/22 2200 Code Status: Full code, confirmed with patient on admission Family Communication: Discussed with patient, she has discussed with family Disposition Plan: From home and likely discharge to home pending clinical progress Consults called: [facility] Severity of Illness: The appropriate patient status for this patient is OBSERVATION. Observation status  is judged to be reasonable and necessary in order to provide the required intensity of service to ensure the patient's safety. The patient's presenting symptoms, physical exam findings, and initial radiographic and laboratory data in the context of their medical condition is felt to place them at decreased risk for further clinical deterioration. Furthermore, it is anticipated that the patient will be medically stable for discharge from the hospital within 2 midnights of admission.   Zada Finders MD [facility]  If 7PM-7AM, please contact night-coverage [email or web address]  05/04/2022, 11:39 PM

## 2022-05-04 NOTE — Hospital Course (Signed)
Sheila Wood is a 87 y.o. female with medical history significant for HLD, s/p cholecystectomy 2007 who is admitted for management of obstructive choledocholithiasis. She has had ongoing outpatient work-up for elevated LFTs since approximately July 2023.  She ultimately underwent MRCP which was found to show a 12 mm choledocholithiasis at the ampulla.  She was referred to the hospital for admission and evaluation by GI.

## 2022-05-05 ENCOUNTER — Encounter (HOSPITAL_COMMUNITY): Payer: Self-pay | Admitting: Internal Medicine

## 2022-05-05 DIAGNOSIS — K838 Other specified diseases of biliary tract: Secondary | ICD-10-CM | POA: Diagnosis not present

## 2022-05-05 DIAGNOSIS — K8051 Calculus of bile duct without cholangitis or cholecystitis with obstruction: Secondary | ICD-10-CM | POA: Diagnosis not present

## 2022-05-05 DIAGNOSIS — K8020 Calculus of gallbladder without cholecystitis without obstruction: Secondary | ICD-10-CM

## 2022-05-05 DIAGNOSIS — E785 Hyperlipidemia, unspecified: Secondary | ICD-10-CM | POA: Diagnosis present

## 2022-05-05 DIAGNOSIS — K8050 Calculus of bile duct without cholangitis or cholecystitis without obstruction: Secondary | ICD-10-CM | POA: Diagnosis present

## 2022-05-05 DIAGNOSIS — Z800 Family history of malignant neoplasm of digestive organs: Secondary | ICD-10-CM | POA: Diagnosis not present

## 2022-05-05 DIAGNOSIS — Z801 Family history of malignant neoplasm of trachea, bronchus and lung: Secondary | ICD-10-CM | POA: Diagnosis not present

## 2022-05-05 DIAGNOSIS — Y733 Surgical instruments, materials and gastroenterology and urology devices (including sutures) associated with adverse incidents: Secondary | ICD-10-CM | POA: Diagnosis present

## 2022-05-05 DIAGNOSIS — R7989 Other specified abnormal findings of blood chemistry: Secondary | ICD-10-CM

## 2022-05-05 DIAGNOSIS — Y839 Surgical procedure, unspecified as the cause of abnormal reaction of the patient, or of later complication, without mention of misadventure at the time of the procedure: Secondary | ICD-10-CM | POA: Diagnosis present

## 2022-05-05 DIAGNOSIS — Z8249 Family history of ischemic heart disease and other diseases of the circulatory system: Secondary | ICD-10-CM | POA: Diagnosis not present

## 2022-05-05 DIAGNOSIS — Z833 Family history of diabetes mellitus: Secondary | ICD-10-CM | POA: Diagnosis not present

## 2022-05-05 DIAGNOSIS — K9186 Retained cholelithiasis following cholecystectomy: Secondary | ICD-10-CM | POA: Diagnosis present

## 2022-05-05 DIAGNOSIS — R933 Abnormal findings on diagnostic imaging of other parts of digestive tract: Secondary | ICD-10-CM | POA: Diagnosis not present

## 2022-05-05 DIAGNOSIS — K2970 Gastritis, unspecified, without bleeding: Secondary | ICD-10-CM | POA: Diagnosis not present

## 2022-05-05 DIAGNOSIS — Z886 Allergy status to analgesic agent status: Secondary | ICD-10-CM | POA: Diagnosis not present

## 2022-05-05 DIAGNOSIS — Z8042 Family history of malignant neoplasm of prostate: Secondary | ICD-10-CM | POA: Diagnosis not present

## 2022-05-05 DIAGNOSIS — M810 Age-related osteoporosis without current pathological fracture: Secondary | ICD-10-CM | POA: Diagnosis present

## 2022-05-05 DIAGNOSIS — K3189 Other diseases of stomach and duodenum: Secondary | ICD-10-CM | POA: Diagnosis not present

## 2022-05-05 DIAGNOSIS — Z880 Allergy status to penicillin: Secondary | ICD-10-CM | POA: Diagnosis not present

## 2022-05-05 DIAGNOSIS — Z9889 Other specified postprocedural states: Secondary | ICD-10-CM | POA: Diagnosis not present

## 2022-05-05 HISTORY — DX: Calculus of gallbladder without cholecystitis without obstruction: K80.20

## 2022-05-05 LAB — COMPREHENSIVE METABOLIC PANEL
ALT: 346 U/L — ABNORMAL HIGH (ref 0–44)
AST: 160 U/L — ABNORMAL HIGH (ref 15–41)
Albumin: 3.5 g/dL (ref 3.5–5.0)
Alkaline Phosphatase: 442 U/L — ABNORMAL HIGH (ref 38–126)
Anion gap: 5 (ref 5–15)
BUN: 17 mg/dL (ref 8–23)
CO2: 27 mmol/L (ref 22–32)
Calcium: 9.3 mg/dL (ref 8.9–10.3)
Chloride: 108 mmol/L (ref 98–111)
Creatinine, Ser: 0.6 mg/dL (ref 0.44–1.00)
GFR, Estimated: >60 mL/min (ref >60)
Glucose, Bld: 86 mg/dL (ref 70–99)
Potassium: 4.5 mmol/L (ref 3.5–5.1)
Sodium: 140 mmol/L (ref 135–145)
Total Bilirubin: 0.8 mg/dL (ref 0.3–1.2)
Total Protein: 6.2 g/dL — ABNORMAL LOW (ref 6.5–8.1)

## 2022-05-05 LAB — URINALYSIS, ROUTINE W REFLEX MICROSCOPIC
Bilirubin Urine: NEGATIVE
Glucose, UA: NEGATIVE mg/dL
Hgb urine dipstick: NEGATIVE
Ketones, ur: NEGATIVE mg/dL
Leukocytes,Ua: NEGATIVE
Nitrite: NEGATIVE
Protein, ur: NEGATIVE mg/dL
Specific Gravity, Urine: 1.006 (ref 1.005–1.030)
pH: 6 (ref 5.0–8.0)

## 2022-05-05 LAB — CBC
HCT: 36.8 % (ref 36.0–46.0)
Hemoglobin: 12.1 g/dL (ref 12.0–15.0)
MCH: 31.6 pg (ref 26.0–34.0)
MCHC: 32.9 g/dL (ref 30.0–36.0)
MCV: 96.1 fL (ref 80.0–100.0)
Platelets: 166 10*3/uL (ref 150–400)
RBC: 3.83 MIL/uL — ABNORMAL LOW (ref 3.87–5.11)
RDW: 13.6 % (ref 11.5–15.5)
WBC: 4.3 10*3/uL (ref 4.0–10.5)
nRBC: 0 % (ref 0.0–0.2)

## 2022-05-05 MED ORDER — ENOXAPARIN SODIUM 40 MG/0.4ML IJ SOSY: 40.0000 mg | PREFILLED_SYRINGE | INTRAMUSCULAR | Status: DC

## 2022-05-05 MED ORDER — SODIUM CHLORIDE 0.9 % IV SOLN: INTRAVENOUS | Status: DC

## 2022-05-05 NOTE — Plan of Care (Signed)

## 2022-05-05 NOTE — Plan of Care (Signed)
  Problem: Education: Goal: Knowledge of General Education information will improve Description Including pain rating scale, medication(s)/side effects and non-pharmacologic comfort measures Outcome: Progressing   

## 2022-05-05 NOTE — H&P (View-Only) (Signed)
Consultation  Referring Provider:   Dr. Sabino Gasser Primary Care Physician:  Marin Olp, MD Primary Gastroenterologist: Dr. Carlean Purl       Reason for Consultation: Choledocholithiasis            HPI:   Sheila Wood is a 87 y.o. female with a past medical history of hyperlipidemia and colon polyps, status postcholecystectomy 2007, who presented to the ED on 05/04/2022 for evaluation of choledocholithiasis.    Apparently patient been evaluated on outpatient basis for elevated liver enzymes with recent labs 9/11 showing AST 299, ALT 460, alk phos 479 total bili 0.6.  9/14 right upper quadrant ultrasound showed CBD dilated measuring 12 mm and prior cholecystectomy changes, MRCP obtained 9/15 showed biliary obstruction with intra and extrahepatic biliary dilation secondary to a 12 mm choledocholithiasis at the ampulla and very mild peribiliary enhancement.    Today, patient explains that she has not really had any abdominal pain or other symptoms.  She has had soft light-colored stool but no diarrhea.  Does tell me that she feels like she has incomplete evacuation, noting that she will have a small piece of light-colored stool and then feel like she has to go right back to have another.  This been happening over the past couple months.  Otherwise no symptoms at all, no nausea, vomiting, heartburn or reflux.    Denies fever chills  ED course: WBC 5.9, platelets 215,000, sodium 140, potassium 3.4, AST 160, ALT 298, alk phos 375, total bili 0.7 and lipase 41  GI history: 10/29/2008 colonoscopy for history of adenomatous polyps: Noted a family history of colon cancer, internal hemorrhoids and normal anastomosis, ileocolonic, status post right hemicolectomy, repeat recommended in 5 years  Past Medical History:  Diagnosis Date   History of colonic polyps    Hyperlipidemia    Internal hemorrhoid    Osteoporosis    Shingles     Past Surgical History:  Procedure Laterality Date   CHOLECYSTECTOMY      at time of colectomy   COLONOSCOPY     ESOPHAGOGASTRODUODENOSCOPY     RIGHT COLECTOMY     adenomatous polyp    Family History  Problem Relation Age of Onset   Colon cancer Brother    Heart disease Mother        unclear history   Diabetes Sister    Lung cancer Father    Kidney disease Son    Prostate cancer Brother     Social History   Tobacco Use   Smoking status: Never   Smokeless tobacco: Never  Vaping Use   Vaping Use: Never used  Substance Use Topics   Alcohol use: Not Currently    Alcohol/week: 2.0 standard drinks of alcohol    Types: 2 Standard drinks or equivalent per week    Comment: glass of wine- rarely uses   Drug use: No    Prior to Admission medications   Medication Sig Start Date End Date Taking? Authorizing Provider  cholecalciferol (VITAMIN D3) 25 MCG (1000 UNIT) tablet Take 1,000 Units by mouth daily.   Yes [provider]    Current Facility-Administered Medications  Medication Dose Route Frequency Provider Last Rate Last Admin   enoxaparin (LOVENOX) injection 40 mg  40 mg Subcutaneous Q24H Lenore Cordia, MD       lactated ringers infusion   Intravenous Continuous Lenore Cordia, MD 100 mL/hr at 05/05/22 0004 New Bag at 05/05/22 0004   morphine (PF) 2 MG/ML injection  1 mg  1 mg Intravenous Q4H PRN Lenore Cordia, MD       ondansetron [facility]) tablet 4 mg  4 mg Oral Q6H PRN Lenore Cordia, MD       Or   ondansetron [facility]) injection 4 mg  4 mg Intravenous Q6H PRN Lenore Cordia, MD        Allergies as of 05/04/2022 - Review Complete 05/04/2022  Allergen Reaction Noted   Cannabinoids Hives 11/21/2020   Penicillins Rash 06/24/2016   Aspirin  05/05/2007     Review of Systems:    Constitutional: No weight loss, fever or chills Skin: No rash  Cardiovascular: No chest pain  Respiratory: No SOB  Gastrointestinal: See HPI and otherwise negative Genitourinary: No dysuria  Neurological: No headache, dizziness or  syncope Musculoskeletal: No new muscle or joint pain Hematologic: No bruising Psychiatric: No history of depression or anxiety    Physical Exam:  Vital signs in last 24 hours: Temp:  [97.7 F (36.5 C)-98.8 F (37.1 C)] 98 F (36.7 C) (09/16 0546) Pulse Rate:  [55-69] 58 (09/16 0546) Resp:  [12-19] 14 (09/16 0546) BP: (129-165)/(61-88) 145/84 (09/16 0546) SpO2:  [97 %-100 %] 99 % (09/16 0546) Weight:  [52 kg] 52 kg (09/15 2325) Last BM Date : 05/04/22 General:   Pleasant Caucasian appears to be in NAD, Well developed, Well nourished, alert and cooperative Head:  Normocephalic and atraumatic. Eyes:   PEERL, EOMI. No icterus. Conjunctiva pink. Ears:  Normal auditory acuity. Neck:  Supple Throat: Oral cavity and pharynx without inflammation, swelling or lesion.  Lungs: Respirations even and unlabored. Lungs clear to auscultation bilaterally.   No wheezes, crackles, or rhonchi.  Heart: Normal S1, S2. No MRG. Regular rate and rhythm. No peripheral edema, cyanosis or pallor.  Abdomen:  Soft, nondistended, nontender. No rebound or guarding. Normal bowel sounds. No appreciable masses or hepatomegaly. Rectal:  Not performed.  Msk:  Symmetrical without gross deformities. Peripheral pulses intact.  Extremities:  Without edema, no deformity or joint abnormality. Normal ROM, normal sensation. Neurologic:  Alert and  oriented x4;  grossly normal neurologically. Skin:   Dry and intact without significant lesions or rashes. Psychiatric: Demonstrates good judgement and reason without abnormal affect or behaviors.  LAB RESULTS: Recent Labs    05/04/22 2110 05/05/22 0334  WBC 5.9 4.3  HGB 13.3 12.1  HCT 40.1 36.8  PLT 215 166   BMET Recent Labs    05/04/22 2208 05/05/22 0334  NA 140 140  K 3.4* 4.5  CL 113* 108  CO2 24 27  GLUCOSE 83 86  BUN 18 17  CREATININE 0.60 0.60  CALCIUM 8.0* 9.3   LFT Recent Labs    05/05/22 0334  PROT 6.2*  ALBUMIN 3.5  AST 160*  ALT 346*   ALKPHOS 442*  BILITOT 0.8    STUDIES: MR ABDOMEN MRCP W WO CONTAST  Result Date: 05/04/2022 CLINICAL DATA:  Elevated transaminase level. Elevated alkaline phosphatase level. Common bile duct dilation on ultrasound. EXAM: MRI ABDOMEN WITHOUT AND WITH CONTRAST (INCLUDING MRCP) TECHNIQUE: Multiplanar multisequence MR imaging of the abdomen was performed both before and after the administration of intravenous contrast. Heavily T2-weighted images of the biliary and pancreatic ducts were obtained, and three-dimensional MRCP images were rendered by post processing. CONTRAST:  56m GADAVIST GADOBUTROL 1 MMOL/ML IV SOLN COMPARISON:  Ultrasound May 03, 2022. FINDINGS: Lower chest: No acute abnormality. Hepatobiliary: No significant hepatic steatosis. Scattered hepatic cysts. Gallbladder surgically absent. Dilation of the intra  and extrahepatic biliary tree with the common bile duct measuring 15 mm in maximum diameter and a 12 mm choledocholithiasis at the ampulla. Very mild peribiliary enhancement may be reactive or reflect ascending infection. Pancreas: Pancreatic divisum, anatomic variant. No pancreatic ductal dilation or evidence of acute inflammation. Spleen:  No splenomegaly or focal splenic lesion. Adrenals/Urinary Tract: Bilateral adrenal glands appear normal. No hydronephrosis. No solid enhancing renal mass. Stomach/Bowel: No pathologic dilation or evidence of acute inflammation involving loops of large or small bowel in the abdomen. Vascular/Lymphatic: No pathologically enlarged lymph nodes identified. No abdominal aortic aneurysm demonstrated. Other:  None. Musculoskeletal: No suspicious bone lesions identified. IMPRESSION: 1. Biliary obstruction with intra and extrahepatic biliary dilation secondary to a 12 mm choledocholithiasis at the ampulla. Additionally, there is very mild peribiliary enhancement which may be reactive or reflect ascending cholangitis, clinical and laboratory assessment suggested.  2. Cholecystectomy. These results will be called to the ordering clinician or representative by the Radiologist Assistant, and communication documented in the PACS or [employer]. Electronically Signed   By: Dahlia Bailiff M.D.   On: 05/04/2022 14:19   MR 3D Recon At Scanner  Result Date: 05/04/2022 CLINICAL DATA:  Elevated transaminase level. Elevated alkaline phosphatase level. Common bile duct dilation on ultrasound. EXAM: MRI ABDOMEN WITHOUT AND WITH CONTRAST (INCLUDING MRCP) TECHNIQUE: Multiplanar multisequence MR imaging of the abdomen was performed both before and after the administration of intravenous contrast. Heavily T2-weighted images of the biliary and pancreatic ducts were obtained, and three-dimensional MRCP images were rendered by post processing. CONTRAST:  33m GADAVIST GADOBUTROL 1 MMOL/ML IV SOLN COMPARISON:  Ultrasound May 03, 2022. FINDINGS: Lower chest: No acute abnormality. Hepatobiliary: No significant hepatic steatosis. Scattered hepatic cysts. Gallbladder surgically absent. Dilation of the intra and extrahepatic biliary tree with the common bile duct measuring 15 mm in maximum diameter and a 12 mm choledocholithiasis at the ampulla. Very mild peribiliary enhancement may be reactive or reflect ascending infection. Pancreas: Pancreatic divisum, anatomic variant. No pancreatic ductal dilation or evidence of acute inflammation. Spleen:  No splenomegaly or focal splenic lesion. Adrenals/Urinary Tract: Bilateral adrenal glands appear normal. No hydronephrosis. No solid enhancing renal mass. Stomach/Bowel: No pathologic dilation or evidence of acute inflammation involving loops of large or small bowel in the abdomen. Vascular/Lymphatic: No pathologically enlarged lymph nodes identified. No abdominal aortic aneurysm demonstrated. Other:  None. Musculoskeletal: No suspicious bone lesions identified. IMPRESSION: 1. Biliary obstruction with intra and extrahepatic biliary dilation  secondary to a 12 mm choledocholithiasis at the ampulla. Additionally, there is very mild peribiliary enhancement which may be reactive or reflect ascending cholangitis, clinical and laboratory assessment suggested. 2. Cholecystectomy. These results will be called to the ordering clinician or representative by the Radiologist Assistant, and communication documented in the PACS or [employer] Electronically Signed   By: JDahlia BailiffM.D.   On: 05/04/2022 14:19     Impression / Plan:   Impression: 1.  Choledocholithiasis: Outpatient MRCP as above with bili obstruction secondary to 12 mm choledocholithiasis at the ampulla and elevated LFTs with mild peribiliary enhancement, no fever or leukocytosis, asymptomatic 2.  Elevated LFTs: With above  Plan: 1.  ERCP tomorrow with Dr. PHenrene Pastor  Did discuss risks, benefits, limitations and alternatives and patient agrees to proceed. 2.  Patient can be on low-fat diet today, n.p.o. at midnight 3.  Will hold Lovenox in preparation for procedure  Thank you for your kind consultation, we will continue to follow.  JLavone NianLGulf Coast Surgical Center 05/05/2022, 8:35 AM  GI ATTENDING  History, laboratories, x-rays all personally reviewed.  Patient personally seen and examined.  Agree with comprehensive consultation note as outlined above.  Patient has been being worked up as an outpatient for elevated liver tests.  She underwent MRCP (reviewed) and appears to have a large stone in the distal bile duct.  She is status postcholecystectomy.  Her liver tests are elevated as noted above.  Normal bilirubin.  She was admitted for this purpose with plans for ERCP and common duct stone removal.The nature of the procedure, as well as the risks (including but not limited to, pancreatitis, perforation, bleeding, infection, failed cannulation), benefits, and alternatives were carefully and thoroughly reviewed with the patient. Ample time for discussion and questions allowed. The patient  understood, was satisfied, and agreed to proceed.  Docia Chuck. Geri Seminole., M.D. [facility] Division of Gastroenterology

## 2022-05-05 NOTE — Consult Note (Addendum)
Consultation  Referring Provider:   Dr. Sabino Gasser Primary Care Physician:  Marin Olp, MD Primary Gastroenterologist: Dr. Carlean Purl       Reason for Consultation: Choledocholithiasis            HPI:   Sheila Wood is a 87 y.o. female with a past medical history of hyperlipidemia and colon polyps, status postcholecystectomy 2007, who presented to the ED on 05/04/2022 for evaluation of choledocholithiasis.    Apparently patient been evaluated on outpatient basis for elevated liver enzymes with recent labs 9/11 showing AST 299, ALT 460, alk phos 479 total bili 0.6.  9/14 right upper quadrant ultrasound showed CBD dilated measuring 12 mm and prior cholecystectomy changes, MRCP obtained 9/15 showed biliary obstruction with intra and extrahepatic biliary dilation secondary to a 12 mm choledocholithiasis at the ampulla and very mild peribiliary enhancement.    Today, patient explains that she has not really had any abdominal pain or other symptoms.  She has had soft light-colored stool but no diarrhea.  Does tell me that she feels like she has incomplete evacuation, noting that she will have a small piece of light-colored stool and then feel like she has to go right back to have another.  This been happening over the past couple months.  Otherwise no symptoms at all, no nausea, vomiting, heartburn or reflux.    Denies fever chills  ED course: WBC 5.9, platelets 215,000, sodium 140, potassium 3.4, AST 160, ALT 298, alk phos 375, total bili 0.7 and lipase 41  GI history: 10/29/2008 colonoscopy for history of adenomatous polyps: Noted a family history of colon cancer, internal hemorrhoids and normal anastomosis, ileocolonic, status post right hemicolectomy, repeat recommended in 5 years  Past Medical History:  Diagnosis Date   History of colonic polyps    Hyperlipidemia    Internal hemorrhoid    Osteoporosis    Shingles     Past Surgical History:  Procedure Laterality Date   CHOLECYSTECTOMY      at time of colectomy   COLONOSCOPY     ESOPHAGOGASTRODUODENOSCOPY     RIGHT COLECTOMY     adenomatous polyp    Family History  Problem Relation Age of Onset   Colon cancer Brother    Heart disease Mother        unclear history   Diabetes Sister    Lung cancer Father    Kidney disease Son    Prostate cancer Brother     Social History   Tobacco Use   Smoking status: Never   Smokeless tobacco: Never  Vaping Use   Vaping Use: Never used  Substance Use Topics   Alcohol use: Not Currently    Alcohol/week: 2.0 standard drinks of alcohol    Types: 2 Standard drinks or equivalent per week    Comment: glass of wine- rarely uses   Drug use: No    Prior to Admission medications   Medication Sig Start Date End Date Taking? Authorizing Provider  cholecalciferol (VITAMIN D3) 25 MCG (1000 UNIT) tablet Take 1,000 Units by mouth daily.   Yes [provider]    Current Facility-Administered Medications  Medication Dose Route Frequency Provider Last Rate Last Admin   enoxaparin (LOVENOX) injection 40 mg  40 mg Subcutaneous Q24H Lenore Cordia, MD       lactated ringers infusion   Intravenous Continuous Lenore Cordia, MD 100 mL/hr at 05/05/22 0004 New Bag at 05/05/22 0004   morphine (PF) 2 MG/ML injection  1 mg  1 mg Intravenous Q4H PRN Lenore Cordia, MD       ondansetron [facility]) tablet 4 mg  4 mg Oral Q6H PRN Lenore Cordia, MD       Or   ondansetron [facility]) injection 4 mg  4 mg Intravenous Q6H PRN Lenore Cordia, MD        Allergies as of 05/04/2022 - Review Complete 05/04/2022  Allergen Reaction Noted   Cannabinoids Hives 11/21/2020   Penicillins Rash 06/24/2016   Aspirin  05/05/2007     Review of Systems:    Constitutional: No weight loss, fever or chills Skin: No rash  Cardiovascular: No chest pain  Respiratory: No SOB  Gastrointestinal: See HPI and otherwise negative Genitourinary: No dysuria  Neurological: No headache, dizziness or  syncope Musculoskeletal: No new muscle or joint pain Hematologic: No bruising Psychiatric: No history of depression or anxiety    Physical Exam:  Vital signs in last 24 hours: Temp:  [97.7 F (36.5 C)-98.8 F (37.1 C)] 98 F (36.7 C) (09/16 0546) Pulse Rate:  [55-69] 58 (09/16 0546) Resp:  [12-19] 14 (09/16 0546) BP: (129-165)/(61-88) 145/84 (09/16 0546) SpO2:  [97 %-100 %] 99 % (09/16 0546) Weight:  [52 kg] 52 kg (09/15 2325) Last BM Date : 05/04/22 General:   Pleasant Caucasian appears to be in NAD, Well developed, Well nourished, alert and cooperative Head:  Normocephalic and atraumatic. Eyes:   PEERL, EOMI. No icterus. Conjunctiva pink. Ears:  Normal auditory acuity. Neck:  Supple Throat: Oral cavity and pharynx without inflammation, swelling or lesion.  Lungs: Respirations even and unlabored. Lungs clear to auscultation bilaterally.   No wheezes, crackles, or rhonchi.  Heart: Normal S1, S2. No MRG. Regular rate and rhythm. No peripheral edema, cyanosis or pallor.  Abdomen:  Soft, nondistended, nontender. No rebound or guarding. Normal bowel sounds. No appreciable masses or hepatomegaly. Rectal:  Not performed.  Msk:  Symmetrical without gross deformities. Peripheral pulses intact.  Extremities:  Without edema, no deformity or joint abnormality. Normal ROM, normal sensation. Neurologic:  Alert and  oriented x4;  grossly normal neurologically. Skin:   Dry and intact without significant lesions or rashes. Psychiatric: Demonstrates good judgement and reason without abnormal affect or behaviors.  LAB RESULTS: Recent Labs    05/04/22 2110 05/05/22 0334  WBC 5.9 4.3  HGB 13.3 12.1  HCT 40.1 36.8  PLT 215 166   BMET Recent Labs    05/04/22 2208 05/05/22 0334  NA 140 140  K 3.4* 4.5  CL 113* 108  CO2 24 27  GLUCOSE 83 86  BUN 18 17  CREATININE 0.60 0.60  CALCIUM 8.0* 9.3   LFT Recent Labs    05/05/22 0334  PROT 6.2*  ALBUMIN 3.5  AST 160*  ALT 346*   ALKPHOS 442*  BILITOT 0.8    STUDIES: MR ABDOMEN MRCP W WO CONTAST  Result Date: 05/04/2022 CLINICAL DATA:  Elevated transaminase level. Elevated alkaline phosphatase level. Common bile duct dilation on ultrasound. EXAM: MRI ABDOMEN WITHOUT AND WITH CONTRAST (INCLUDING MRCP) TECHNIQUE: Multiplanar multisequence MR imaging of the abdomen was performed both before and after the administration of intravenous contrast. Heavily T2-weighted images of the biliary and pancreatic ducts were obtained, and three-dimensional MRCP images were rendered by post processing. CONTRAST:  56m GADAVIST GADOBUTROL 1 MMOL/ML IV SOLN COMPARISON:  Ultrasound May 03, 2022. FINDINGS: Lower chest: No acute abnormality. Hepatobiliary: No significant hepatic steatosis. Scattered hepatic cysts. Gallbladder surgically absent. Dilation of the intra  and extrahepatic biliary tree with the common bile duct measuring 15 mm in maximum diameter and a 12 mm choledocholithiasis at the ampulla. Very mild peribiliary enhancement may be reactive or reflect ascending infection. Pancreas: Pancreatic divisum, anatomic variant. No pancreatic ductal dilation or evidence of acute inflammation. Spleen:  No splenomegaly or focal splenic lesion. Adrenals/Urinary Tract: Bilateral adrenal glands appear normal. No hydronephrosis. No solid enhancing renal mass. Stomach/Bowel: No pathologic dilation or evidence of acute inflammation involving loops of large or small bowel in the abdomen. Vascular/Lymphatic: No pathologically enlarged lymph nodes identified. No abdominal aortic aneurysm demonstrated. Other:  None. Musculoskeletal: No suspicious bone lesions identified. IMPRESSION: 1. Biliary obstruction with intra and extrahepatic biliary dilation secondary to a 12 mm choledocholithiasis at the ampulla. Additionally, there is very mild peribiliary enhancement which may be reactive or reflect ascending cholangitis, clinical and laboratory assessment suggested.  2. Cholecystectomy. These results will be called to the ordering clinician or representative by the Radiologist Assistant, and communication documented in the PACS or [employer]. Electronically Signed   By: Dahlia Bailiff M.D.   On: 05/04/2022 14:19   MR 3D Recon At Scanner  Result Date: 05/04/2022 CLINICAL DATA:  Elevated transaminase level. Elevated alkaline phosphatase level. Common bile duct dilation on ultrasound. EXAM: MRI ABDOMEN WITHOUT AND WITH CONTRAST (INCLUDING MRCP) TECHNIQUE: Multiplanar multisequence MR imaging of the abdomen was performed both before and after the administration of intravenous contrast. Heavily T2-weighted images of the biliary and pancreatic ducts were obtained, and three-dimensional MRCP images were rendered by post processing. CONTRAST:  33m GADAVIST GADOBUTROL 1 MMOL/ML IV SOLN COMPARISON:  Ultrasound May 03, 2022. FINDINGS: Lower chest: No acute abnormality. Hepatobiliary: No significant hepatic steatosis. Scattered hepatic cysts. Gallbladder surgically absent. Dilation of the intra and extrahepatic biliary tree with the common bile duct measuring 15 mm in maximum diameter and a 12 mm choledocholithiasis at the ampulla. Very mild peribiliary enhancement may be reactive or reflect ascending infection. Pancreas: Pancreatic divisum, anatomic variant. No pancreatic ductal dilation or evidence of acute inflammation. Spleen:  No splenomegaly or focal splenic lesion. Adrenals/Urinary Tract: Bilateral adrenal glands appear normal. No hydronephrosis. No solid enhancing renal mass. Stomach/Bowel: No pathologic dilation or evidence of acute inflammation involving loops of large or small bowel in the abdomen. Vascular/Lymphatic: No pathologically enlarged lymph nodes identified. No abdominal aortic aneurysm demonstrated. Other:  None. Musculoskeletal: No suspicious bone lesions identified. IMPRESSION: 1. Biliary obstruction with intra and extrahepatic biliary dilation  secondary to a 12 mm choledocholithiasis at the ampulla. Additionally, there is very mild peribiliary enhancement which may be reactive or reflect ascending cholangitis, clinical and laboratory assessment suggested. 2. Cholecystectomy. These results will be called to the ordering clinician or representative by the Radiologist Assistant, and communication documented in the PACS or [employer] Electronically Signed   By: JDahlia BailiffM.D.   On: 05/04/2022 14:19     Impression / Plan:   Impression: 1.  Choledocholithiasis: Outpatient MRCP as above with bili obstruction secondary to 12 mm choledocholithiasis at the ampulla and elevated LFTs with mild peribiliary enhancement, no fever or leukocytosis, asymptomatic 2.  Elevated LFTs: With above  Plan: 1.  ERCP tomorrow with Dr. PHenrene Pastor  Did discuss risks, benefits, limitations and alternatives and patient agrees to proceed. 2.  Patient can be on low-fat diet today, n.p.o. at midnight 3.  Will hold Lovenox in preparation for procedure  Thank you for your kind consultation, we will continue to follow.  JLavone NianLGulf Coast Surgical Center 05/05/2022, 8:35 AM  GI ATTENDING  History, laboratories, x-rays all personally reviewed.  Patient personally seen and examined.  Agree with comprehensive consultation note as outlined above.  Patient has been being worked up as an outpatient for elevated liver tests.  She underwent MRCP (reviewed) and appears to have a large stone in the distal bile duct.  She is status postcholecystectomy.  Her liver tests are elevated as noted above.  Normal bilirubin.  She was admitted for this purpose with plans for ERCP and common duct stone removal.The nature of the procedure, as well as the risks (including but not limited to, pancreatitis, perforation, bleeding, infection, failed cannulation), benefits, and alternatives were carefully and thoroughly reviewed with the patient. Ample time for discussion and questions allowed. The patient  understood, was satisfied, and agreed to proceed.  Docia Chuck. Geri Seminole., M.D. [facility] Division of Gastroenterology

## 2022-05-05 NOTE — Anesthesia Preprocedure Evaluation (Signed)
Anesthesia Evaluation  Patient identified by MRN, date of birth, ID band Patient awake    Reviewed: Allergy & Precautions, NPO status , Patient's Chart, lab work & pertinent test results  Airway Mallampati: III  TM Distance: >3 FB Neck ROM: Full    Dental  (+) Dental Advisory Given, Teeth Intact   Pulmonary neg pulmonary ROS,    Pulmonary exam normal breath sounds clear to auscultation       Cardiovascular negative cardio ROS Normal cardiovascular exam Rhythm:Regular Rate:Normal     Neuro/Psych negative neurological ROS     GI/Hepatic negative GI ROS, Neg liver ROS,   Endo/Other  negative endocrine ROS  Renal/GU negative Renal ROS     Musculoskeletal negative musculoskeletal ROS (+)   Abdominal   Peds  Hematology negative hematology ROS (+)   Anesthesia Other Findings   Reproductive/Obstetrics                            Anesthesia Physical Anesthesia Plan  ASA: 2  Anesthesia Plan: General   Post-op Pain Management: Minimal or no pain anticipated   Induction: Intravenous  PONV Risk Score and Plan: 3 and Ondansetron, Dexamethasone and Treatment may vary due to age or medical condition  Airway Management Planned: Oral ETT  Additional Equipment:   Intra-op Plan:   Post-operative Plan: Extubation in OR  Informed Consent: I have reviewed the patients History and Physical, chart, labs and discussed the procedure including the risks, benefits and alternatives for the proposed anesthesia with the patient or authorized representative who has indicated his/her understanding and acceptance.     Dental advisory given  Plan Discussed with: CRNA  Anesthesia Plan Comments:        Anesthesia Quick Evaluation

## 2022-05-05 NOTE — Progress Notes (Signed)
Progress Note    Sheila Wood   PIR:[id number]  DOB: [birth year]  DOA: 05/04/2022     0 PCP: Marin Olp, MD  Initial CC: Referred to the hospital for choledocholithiasis  Hospital Course: Sheila Wood is a 87 y.o. female with medical history significant for HLD, s/p cholecystectomy 2007 who is admitted for management of obstructive choledocholithiasis. She has had ongoing outpatient work-up for elevated LFTs since approximately July 2023.  She ultimately underwent MRCP which was found to show a 12 mm choledocholithiasis at the ampulla.  She was referred to the hospital for admission and evaluation by GI.  Interval History:  Resting in bed when seen this morning.  Denies any fevers, chills, abdominal pain.  Understands plan is for ERCP tomorrow.  Assessment and Plan: * Choledocholithiasis with obstruction - Outpatient MRCP showed biliary obstruction secondary to 12 mm choledocholithiasis at the ampulla as cause of her elevated LFTs.  Very mild peribiliary enhancement is likely reactive.  Doubt cholangitis without fever, abdominal pain, or leukocytosis.  No indication for antibiotics at this time. -Appreciate GI evaluation.  Plan is for ERCP on Sunday - Continue diet today - N.p.o. at midnight    Old records reviewed in assessment of this patient  Antimicrobials:   DVT prophylaxis:  enoxaparin (LOVENOX) injection 40 mg Start: 05/06/22 2200   Code Status:   Code Status: Full Code  Mobility Assessment (last 72 hours)     Mobility Assessment     Row Name 05/05/22 0941 05/04/22 2344         Does patient have an order for bedrest or is patient medically unstable No - Continue assessment No - Continue assessment      What is the highest level of mobility based on the progressive mobility assessment? Level 6 (Walks independently in room and hall) - Balance while walking in room without assist - Complete Level 6 (Walks independently in room and hall) - Balance while walking in  room without assist - Complete               Barriers to discharge:  Disposition Plan: Home in 1 to 2 days Status is: obs  Objective: Blood pressure (!) 158/66, pulse (!) 51, temperature 98.6 F (37 C), temperature source Oral, resp. rate 18, height '5\' 5"'$  (1.651 m), weight 52 kg, SpO2 100 %.  Examination:  Physical Exam Constitutional:      Appearance: Normal appearance.  HENT:     Head: Normocephalic and atraumatic.     Mouth/Throat:     Mouth: Mucous membranes are moist.  Eyes:     Extraocular Movements: Extraocular movements intact.  Cardiovascular:     Rate and Rhythm: Normal rate and regular rhythm.  Pulmonary:     Effort: Pulmonary effort is normal.     Breath sounds: Normal breath sounds.  Abdominal:     General: Bowel sounds are normal. There is no distension.     Palpations: Abdomen is soft.     Tenderness: There is no abdominal tenderness.  Musculoskeletal:        General: Normal range of motion.     Cervical back: Normal range of motion and neck supple.  Skin:    General: Skin is warm and dry.  Neurological:     General: No focal deficit present.     Mental Status: She is alert.  Psychiatric:        Mood and Affect: Mood normal.        Behavior: Behavior normal.  Consultants:  GI  Procedures:  ERCP planned for 05/06/2022  Data Reviewed: Results for orders placed or performed during the hospital encounter of 05/04/22 (from the past 24 hour(s))  CBC with Differential     Status: None   Collection Time: 05/04/22  9:10 PM  Result Value Ref Range   WBC 5.9 4.0 - 10.5 K/uL   RBC 4.23 3.87 - 5.11 MIL/uL   Hemoglobin 13.3 12.0 - 15.0 g/dL   HCT 40.1 36.0 - 46.0 %   MCV 94.8 80.0 - 100.0 fL   MCH 31.4 26.0 - 34.0 pg   MCHC 33.2 30.0 - 36.0 g/dL   RDW 13.6 11.5 - 15.5 %   Platelets 215 150 - 400 K/uL   nRBC 0.0 0.0 - 0.2 %   Neutrophils Relative % 47 %   Neutro Abs 2.8 1.7 - 7.7 K/uL   Lymphocytes Relative 40 %   Lymphs Abs 2.4 0.7 - 4.0  K/uL   Monocytes Relative 10 %   Monocytes Absolute 0.6 0.1 - 1.0 K/uL   Eosinophils Relative 2 %   Eosinophils Absolute 0.1 0.0 - 0.5 K/uL   Basophils Relative 1 %   Basophils Absolute 0.0 0.0 - 0.1 K/uL   Immature Granulocytes 0 %   Abs Immature Granulocytes 0.02 0.00 - 0.07 K/uL  Acetaminophen level     Status: Abnormal   Collection Time: 05/04/22  9:10 PM  Result Value Ref Range   Acetaminophen (Tylenol), Serum <10 (L) 10 - 30 ug/mL  Ethanol     Status: None   Collection Time: 05/04/22  9:10 PM  Result Value Ref Range   Alcohol, Ethyl (B) <10 <10 mg/dL  Blood culture (routine x 2)     Status: None (Preliminary result)   Collection Time: 05/04/22  9:21 PM   Specimen: Right Antecubital; Blood  Result Value Ref Range   Specimen Description      RIGHT ANTECUBITAL Performed at [facility], [place] [address], [place], [place] [address]    Special Requests      BOTTLES DRAWN AEROBIC AND ANAEROBIC Blood Culture adequate volume Performed at [facility] [address], [place], [place] [address]    Culture      NO GROWTH < 12 HOURS Performed at [facility] [address] [place],  [address]    Report Status PENDING   Comprehensive metabolic panel     Status: Abnormal   Collection Time: 05/04/22 10:08 PM  Result Value Ref Range   Sodium 140 135 - 145 mmol/L   Potassium 3.4 (L) 3.5 - 5.1 mmol/L   Chloride 113 (H) 98 - 111 mmol/L   CO2 24 22 - 32 mmol/L   Glucose, Bld 83 70 - 99 mg/dL   BUN 18 8 - 23 mg/dL   Creatinine, Ser 0.60 0.44 - 1.00 mg/dL   Calcium 8.0 (L) 8.9 - 10.3 mg/dL   Total Protein 5.1 (L) 6.5 - 8.1 g/dL   Albumin 2.9 (L) 3.5 - 5.0 g/dL   AST 160 (H) 15 - 41 U/L   ALT 298 (H) 0 - 44 U/L   Alkaline Phosphatase 375 (H) 38 - 126 U/L   Total Bilirubin 0.7 0.3 - 1.2 mg/dL   GFR, Estimated >60 >60 mL/min   Anion gap 3 (L) 5 - 15  Lipase, blood     Status: None   Collection Time: 05/04/22 10:08 PM  Result  Value Ref Range   Lipase 41  11 - 51 U/L  Urinalysis, Routine w reflex microscopic Urine, Clean Catch     Status: Abnormal   Collection Time: 05/05/22 12:31 AM  Result Value Ref Range   Color, Urine STRAW (A) YELLOW   APPearance CLEAR CLEAR   Specific Gravity, Urine 1.006 1.005 - 1.030   pH 6.0 5.0 - 8.0   Glucose, UA NEGATIVE NEGATIVE mg/dL   Hgb urine dipstick NEGATIVE NEGATIVE   Bilirubin Urine NEGATIVE NEGATIVE   Ketones, ur NEGATIVE NEGATIVE mg/dL   Protein, ur NEGATIVE NEGATIVE mg/dL   Nitrite NEGATIVE NEGATIVE   Leukocytes,Ua NEGATIVE NEGATIVE  Blood culture (routine x 2)     Status: None (Preliminary result)   Collection Time: 05/05/22  3:34 AM   Specimen: Left Antecubital; Blood  Result Value Ref Range   Specimen Description      LEFT ANTECUBITAL BLOOD Performed at [facility], [place] [address], [place], [place] [address]    Special Requests      BOTTLES DRAWN AEROBIC AND ANAEROBIC Blood Culture results may not be optimal due to an inadequate volume of blood received in culture bottles Performed at [facility], [place] [address] [place], [place] [address]    Culture PENDING    Report Status PENDING   Comprehensive metabolic panel     Status: Abnormal   Collection Time: 05/05/22  3:34 AM  Result Value Ref Range   Sodium 140 135 - 145 mmol/L   Potassium 4.5 3.5 - 5.1 mmol/L   Chloride 108 98 - 111 mmol/L   CO2 27 22 - 32 mmol/L   Glucose, Bld 86 70 - 99 mg/dL   BUN 17 8 - 23 mg/dL   Creatinine, Ser 0.60 0.44 - 1.00 mg/dL   Calcium 9.3 8.9 - 10.3 mg/dL   Total Protein 6.2 (L) 6.5 - 8.1 g/dL   Albumin 3.5 3.5 - 5.0 g/dL   AST 160 (H) 15 - 41 U/L   ALT 346 (H) 0 - 44 U/L   Alkaline Phosphatase 442 (H) 38 - 126 U/L   Total Bilirubin 0.8 0.3 - 1.2 mg/dL   GFR, Estimated >60 >60 mL/min   Anion gap 5 5 - 15  CBC     Status: Abnormal   Collection Time: 05/05/22  3:34 AM  Result Value Ref Range   WBC 4.3 4.0 - 10.5 K/uL   RBC 3.83 (L) 3.87 -  5.11 MIL/uL   Hemoglobin 12.1 12.0 - 15.0 g/dL   HCT 36.8 36.0 - 46.0 %   MCV 96.1 80.0 - 100.0 fL   MCH 31.6 26.0 - 34.0 pg   MCHC 32.9 30.0 - 36.0 g/dL   RDW 13.6 11.5 - 15.5 %   Platelets 166 150 - 400 K/uL   nRBC 0.0 0.0 - 0.2 %    I have Reviewed nursing notes, Vitals, and Lab results since pt's last encounter. Pertinent lab results : see above I have ordered test including BMP, CBC, Mg I have reviewed the last note from staff over past 24 hours I have discussed pt's care plan and test results with nursing staff, case manager   LOS: 0 days   Dwyane Dee, MD [facility] 05/05/2022, 12:50 PM

## 2022-05-06 ENCOUNTER — Inpatient Hospital Stay (HOSPITAL_COMMUNITY): Payer: Medicare Other | Admitting: Certified Registered Nurse Anesthetist

## 2022-05-06 ENCOUNTER — Encounter (HOSPITAL_COMMUNITY): Payer: Self-pay | Admitting: Urology

## 2022-05-06 ENCOUNTER — Inpatient Hospital Stay (HOSPITAL_COMMUNITY): Payer: Medicare Other

## 2022-05-06 ENCOUNTER — Encounter (HOSPITAL_COMMUNITY)
Admission: EM | Disposition: A | Discharge status: Home / Self Care | Payer: Self-pay | Source: Home / Self Care | Attending: Internal Medicine

## 2022-05-06 DIAGNOSIS — K8050 Calculus of bile duct without cholangitis or cholecystitis without obstruction: Secondary | ICD-10-CM

## 2022-05-06 DIAGNOSIS — K8051 Calculus of bile duct without cholangitis or cholecystitis with obstruction: Secondary | ICD-10-CM | POA: Diagnosis not present

## 2022-05-06 HISTORY — PX: ERCP: SHX5425

## 2022-05-06 LAB — CBC WITH DIFFERENTIAL/PLATELET
Abs Immature Granulocytes: 0 10*3/uL (ref 0.00–0.07)
Basophils Absolute: 0 10*3/uL (ref 0.0–0.1)
Basophils Relative: 1 %
Eosinophils Absolute: 0.1 10*3/uL (ref 0.0–0.5)
Eosinophils Relative: 3 %
HCT: 35.8 % — ABNORMAL LOW (ref 36.0–46.0)
Hemoglobin: 11.8 g/dL — ABNORMAL LOW (ref 12.0–15.0)
Immature Granulocytes: 0 %
Lymphocytes Relative: 45 %
Lymphs Abs: 1.8 10*3/uL (ref 0.7–4.0)
MCH: 31.3 pg (ref 26.0–34.0)
MCHC: 33 g/dL (ref 30.0–36.0)
MCV: 95 fL (ref 80.0–100.0)
Monocytes Absolute: 0.5 10*3/uL (ref 0.1–1.0)
Monocytes Relative: 12 %
Neutro Abs: 1.6 10*3/uL — ABNORMAL LOW (ref 1.7–7.7)
Neutrophils Relative %: 39 %
Platelets: 176 10*3/uL (ref 150–400)
RBC: 3.77 MIL/uL — ABNORMAL LOW (ref 3.87–5.11)
RDW: 13.5 % (ref 11.5–15.5)
WBC: 4 10*3/uL (ref 4.0–10.5)
nRBC: 0 % (ref 0.0–0.2)

## 2022-05-06 LAB — COMPREHENSIVE METABOLIC PANEL
ALT: 281 U/L — ABNORMAL HIGH (ref 0–44)
AST: 113 U/L — ABNORMAL HIGH (ref 15–41)
Albumin: 3.3 g/dL — ABNORMAL LOW (ref 3.5–5.0)
Alkaline Phosphatase: 432 U/L — ABNORMAL HIGH (ref 38–126)
Anion gap: 7 (ref 5–15)
BUN: 17 mg/dL (ref 8–23)
CO2: 25 mmol/L (ref 22–32)
Calcium: 9.1 mg/dL (ref 8.9–10.3)
Chloride: 109 mmol/L (ref 98–111)
Creatinine, Ser: 0.7 mg/dL (ref 0.44–1.00)
GFR, Estimated: >60 mL/min (ref >60)
Glucose, Bld: 89 mg/dL (ref 70–99)
Potassium: 3.8 mmol/L (ref 3.5–5.1)
Sodium: 141 mmol/L (ref 135–145)
Total Bilirubin: 0.9 mg/dL (ref 0.3–1.2)
Total Protein: 6 g/dL — ABNORMAL LOW (ref 6.5–8.1)

## 2022-05-06 LAB — MAGNESIUM: Magnesium: 2 mg/dL (ref 1.7–2.4)

## 2022-05-06 SURGERY — ERCP, WITH INTERVENTION IF INDICATED
Anesthesia: General

## 2022-05-06 MED ORDER — ROCURONIUM BROMIDE 10 MG/ML (PF) SYRINGE
PREFILLED_SYRINGE | INTRAVENOUS | Status: DC | PRN
  Administered 2022-05-06: 40 mg via INTRAVENOUS

## 2022-05-06 MED ORDER — SODIUM CHLORIDE 0.9 % IV SOLN: INTRAVENOUS | Status: DC

## 2022-05-06 MED ORDER — ONDANSETRON HCL 4 MG/2ML IJ SOLN
INTRAMUSCULAR | Status: DC | PRN
  Administered 2022-05-06: 4 mg via INTRAVENOUS

## 2022-05-06 MED ORDER — DICLOFENAC SUPPOSITORY 100 MG
RECTAL | Status: DC | PRN
  Administered 2022-05-06: 100 mg via RECTAL

## 2022-05-06 MED ORDER — CIPROFLOXACIN IN D5W 400 MG/200ML IV SOLN
400.0000 mg | Freq: Once | INTRAVENOUS | Status: AC
  Administered 2022-05-07: 400 mg via INTRAVENOUS
  Filled 2022-05-06 (×2): qty 200

## 2022-05-06 MED ORDER — SODIUM CHLORIDE 0.9 % IV SOLN
INTRAVENOUS | Status: DC | PRN
  Administered 2022-05-06: 25 mL

## 2022-05-06 MED ORDER — ENOXAPARIN SODIUM 40 MG/0.4ML IJ SOSY: 40.0000 mg | PREFILLED_SYRINGE | INTRAMUSCULAR | Status: DC

## 2022-05-06 MED ORDER — GLUCAGON HCL RDNA (DIAGNOSTIC) 1 MG IJ SOLR
INTRAMUSCULAR | Status: AC
  Filled 2022-05-06: qty 1

## 2022-05-06 MED ORDER — DEXAMETHASONE SODIUM PHOSPHATE 10 MG/ML IJ SOLN
INTRAMUSCULAR | Status: DC | PRN
  Administered 2022-05-06: 8 mg via INTRAVENOUS

## 2022-05-06 MED ORDER — FENTANYL CITRATE (PF) 100 MCG/2ML IJ SOLN
INTRAMUSCULAR | Status: DC | PRN
  Administered 2022-05-06 (×2): 50 ug via INTRAVENOUS

## 2022-05-06 MED ORDER — PROPOFOL 10 MG/ML IV BOLUS
INTRAVENOUS | Status: DC | PRN
  Administered 2022-05-06: 80 mg via INTRAVENOUS

## 2022-05-06 MED ORDER — EPHEDRINE SULFATE-NACL 50-0.9 MG/10ML-% IV SOSY
PREFILLED_SYRINGE | INTRAVENOUS | Status: DC | PRN
  Administered 2022-05-06: 10 mg via INTRAVENOUS

## 2022-05-06 MED ORDER — CIPROFLOXACIN IN D5W 400 MG/200ML IV SOLN
INTRAVENOUS | Status: DC | PRN
  Administered 2022-05-06: 400 mg via INTRAVENOUS

## 2022-05-06 MED ORDER — DICLOFENAC SUPPOSITORY 100 MG
100.0000 mg | Freq: Once | RECTAL | Status: DC
  Filled 2022-05-06 (×2): qty 1

## 2022-05-06 MED ORDER — FENTANYL CITRATE (PF) 100 MCG/2ML IJ SOLN
INTRAMUSCULAR | Status: AC
  Filled 2022-05-06: qty 2

## 2022-05-06 MED ORDER — PROPOFOL 10 MG/ML IV BOLUS
INTRAVENOUS | Status: AC
  Filled 2022-05-06: qty 20

## 2022-05-06 MED ORDER — PHENYLEPHRINE 80 MCG/ML (10ML) SYRINGE FOR IV PUSH (FOR BLOOD PRESSURE SUPPORT)
PREFILLED_SYRINGE | INTRAVENOUS | Status: DC | PRN
  Administered 2022-05-06 (×2): 80 ug via INTRAVENOUS
  Administered 2022-05-06: 40 ug via INTRAVENOUS

## 2022-05-06 MED ORDER — LACTATED RINGERS IV SOLN: INTRAVENOUS | Status: DC | PRN

## 2022-05-06 MED ORDER — SUGAMMADEX SODIUM 200 MG/2ML IV SOLN
INTRAVENOUS | Status: DC | PRN
  Administered 2022-05-06: 104 mg via INTRAVENOUS

## 2022-05-06 MED ORDER — LIDOCAINE 2% (20 MG/ML) 5 ML SYRINGE
INTRAMUSCULAR | Status: DC | PRN
  Administered 2022-05-06: 50 mg via INTRAVENOUS

## 2022-05-06 MED ORDER — DICLOFENAC SUPPOSITORY 100 MG
RECTAL | Status: AC
  Filled 2022-05-06: qty 1

## 2022-05-06 MED ORDER — CIPROFLOXACIN IN D5W 400 MG/200ML IV SOLN
INTRAVENOUS | Status: AC
  Filled 2022-05-06: qty 200

## 2022-05-06 NOTE — Anesthesia Postprocedure Evaluation (Signed)
Anesthesia Post Note  Patient: Sheila Wood  Procedure(s) Performed: ENDOSCOPIC RETROGRADE CHOLANGIOPANCREATOGRAPHY (ERCP)     Patient location during evaluation: PACU Anesthesia Type: General Level of consciousness: sedated and patient cooperative Pain management: pain level controlled Vital Signs Assessment: post-procedure vital signs reviewed and stable Respiratory status: spontaneous breathing Cardiovascular status: stable Anesthetic complications: no   No notable events documented.  Last Vitals:  Vitals:   05/06/22 1146 05/06/22 1315  BP: (!) 143/78 131/66  Pulse: 62 60  Resp: 18 14  Temp: 36.6 C 36.8 C  SpO2: 99% 99%    Last Pain:  Vitals:   05/06/22 1146  TempSrc: Oral  PainSc:                  Nolon Nations

## 2022-05-06 NOTE — Progress Notes (Signed)
Progress Note    Sheila Wood   PIR:[id number]  DOB: 1935-01-26  DOA: 05/04/2022     1 PCP: Marin Olp, MD  Initial CC: Referred to the hospital for choledocholithiasis  Hospital Course: Sheila Wood is a 87 y.o. female with medical history significant for HLD, s/p cholecystectomy 2007 who is admitted for management of obstructive choledocholithiasis. She has had ongoing outpatient work-up for elevated LFTs since approximately July 2023.  She ultimately underwent MRCP which was found to show a 12 mm choledocholithiasis at the ampulla.  She was referred to the hospital for admission and evaluation by GI.  Interval History:  No events overnight.  Seen this morning after returning from ERCP.  She understands the plan is for repeat ERCP tomorrow.  Assessment and Plan: * Choledocholithiasis with obstruction - Outpatient MRCP showed biliary obstruction secondary to 12 mm choledocholithiasis at the ampulla as cause of her elevated LFTs.  Very mild peribiliary enhancement is likely reactive.  Doubt cholangitis without fever, abdominal pain, or leukocytosis.  No indication for antibiotics at this time. -Appreciate GI evaluation.  ERCP on Sunday unable to cannulate CBD; re-attempt planned for 9/18 - resume diet; NPO at [facility] again    Old records reviewed in assessment of this patient  Antimicrobials:   DVT prophylaxis:  enoxaparin (LOVENOX) injection 40 mg Start: 05/07/22 2200   Code Status:   Code Status: Full Code  Mobility Assessment (last 72 hours)     Mobility Assessment     Row Name 05/06/22 0749 05/05/22 2100 05/05/22 0941 05/04/22 2344     Does patient have an order for bedrest or is patient medically unstable No - Continue assessment No - Continue assessment No - Continue assessment No - Continue assessment    What is the highest level of mobility based on the progressive mobility assessment? Level 6 (Walks independently in room and hall) - Balance while walking in room  without assist - Complete Level 6 (Walks independently in room and hall) - Balance while walking in room without assist - Complete Level 6 (Walks independently in room and hall) - Balance while walking in room without assist - Complete Level 6 (Walks independently in room and hall) - Balance while walking in room without assist - Complete             Barriers to discharge:  Disposition Plan: Home in 1 to 2 days Status is: Inpatient  Objective: Blood pressure 131/66, pulse 60, temperature 98.2 F (36.8 C), resp. rate 14, height '5\' 5"'$  (1.651 m), weight 52 kg, SpO2 99 %.  Examination:  Physical Exam Constitutional:      Appearance: Normal appearance.  HENT:     Head: Normocephalic and atraumatic.     Mouth/Throat:     Mouth: Mucous membranes are moist.  Eyes:     Extraocular Movements: Extraocular movements intact.  Cardiovascular:     Rate and Rhythm: Normal rate and regular rhythm.  Pulmonary:     Effort: Pulmonary effort is normal.     Breath sounds: Normal breath sounds.  Abdominal:     General: Bowel sounds are normal. There is no distension.     Palpations: Abdomen is soft.     Tenderness: There is no abdominal tenderness.  Musculoskeletal:        General: Normal range of motion.     Cervical back: Normal range of motion and neck supple.  Skin:    General: Skin is warm and dry.  Neurological:  General: No focal deficit present.     Mental Status: She is alert.  Psychiatric:        Mood and Affect: Mood normal.        Behavior: Behavior normal.      Consultants:  GI  Procedures:  ERCP planned for 05/06/2022  Data Reviewed: Results for orders placed or performed during the hospital encounter of 05/04/22 (from the past 24 hour(s))  CBC with Differential/Platelet     Status: Abnormal   Collection Time: 05/06/22  3:23 AM  Result Value Ref Range   WBC 4.0 4.0 - 10.5 K/uL   RBC 3.77 (L) 3.87 - 5.11 MIL/uL   Hemoglobin 11.8 (L) 12.0 - 15.0 g/dL   HCT 35.8  (L) 36.0 - 46.0 %   MCV 95.0 80.0 - 100.0 fL   MCH 31.3 26.0 - 34.0 pg   MCHC 33.0 30.0 - 36.0 g/dL   RDW 13.5 11.5 - 15.5 %   Platelets 176 150 - 400 K/uL   nRBC 0.0 0.0 - 0.2 %   Neutrophils Relative % 39 %   Neutro Abs 1.6 (L) 1.7 - 7.7 K/uL   Lymphocytes Relative 45 %   Lymphs Abs 1.8 0.7 - 4.0 K/uL   Monocytes Relative 12 %   Monocytes Absolute 0.5 0.1 - 1.0 K/uL   Eosinophils Relative 3 %   Eosinophils Absolute 0.1 0.0 - 0.5 K/uL   Basophils Relative 1 %   Basophils Absolute 0.0 0.0 - 0.1 K/uL   Immature Granulocytes 0 %   Abs Immature Granulocytes 0.00 0.00 - 0.07 K/uL  Comprehensive metabolic panel     Status: Abnormal   Collection Time: 05/06/22  3:23 AM  Result Value Ref Range   Sodium 141 135 - 145 mmol/L   Potassium 3.8 3.5 - 5.1 mmol/L   Chloride 109 98 - 111 mmol/L   CO2 25 22 - 32 mmol/L   Glucose, Bld 89 70 - 99 mg/dL   BUN 17 8 - 23 mg/dL   Creatinine, Ser 0.70 0.44 - 1.00 mg/dL   Calcium 9.1 8.9 - 10.3 mg/dL   Total Protein 6.0 (L) 6.5 - 8.1 g/dL   Albumin 3.3 (L) 3.5 - 5.0 g/dL   AST 113 (H) 15 - 41 U/L   ALT 281 (H) 0 - 44 U/L   Alkaline Phosphatase 432 (H) 38 - 126 U/L   Total Bilirubin 0.9 0.3 - 1.2 mg/dL   GFR, Estimated >60 >60 mL/min   Anion gap 7 5 - 15  Magnesium     Status: None   Collection Time: 05/06/22  3:23 AM  Result Value Ref Range   Magnesium 2.0 1.7 - 2.4 mg/dL    I have Reviewed nursing notes, Vitals, and Lab results since pt's last encounter. Pertinent lab results : see above I have ordered test including BMP, CBC, Mg I have reviewed the last note from staff over past 24 hours I have discussed pt's care plan and test results with nursing staff, case manager   LOS: 1 day   Dwyane Dee, MD [facility] 05/06/2022, 2:59 PM

## 2022-05-06 NOTE — Op Note (Signed)
[facility] Patient Name: Sheila Wood Procedure Date: 05/06/2022 MRN: [id number] Attending MD: Docia Chuck. Henrene Pastor , MD Date of Birth: 1935/08/05 CSN: [id number] Age: 87 Admit Type: Inpatient Procedure:                ERCP Indications:              Bile duct stone(s), Elevated liver enzymes abnormal                            MRCP 12 mm distal stone Providers:                Docia Chuck. Henrene Pastor, MD, Jeanella Cara, RN,                            William Dalton, Technician Referring MD:             [facility] Medicines:                General Anesthesia Complications:            No immediate complications. Estimated Blood Loss:     Estimated blood loss: none. Procedure:                Pre-Anesthesia Assessment:                           - Prior to the procedure, a History and Physical                            was performed, and patient medications and                            allergies were reviewed. The patient's tolerance of                            previous anesthesia was also reviewed. The risks                            and benefits of the procedure and the sedation                            options and risks were discussed with the patient.                            All questions were answered, and informed consent                            was obtained. Prior Anticoagulants: The patient has                            taken no previous anticoagulant or antiplatelet                            agents. ASA Grade Assessment: II - A patient with  mild systemic disease. After reviewing the risks                            and benefits, the patient was deemed in                            satisfactory condition to undergo the procedure.                           After obtaining informed consent, the scope was                            passed under direct vision. Throughout the                            procedure, the patient's  blood pressure, pulse, and                            oxygen saturations were monitored continuously. The                            TJF-Q190V ([id number] Olympus duodenoscope was                            introduced through the mouth, and used to inject                            contrast into and used to cannulate the dorsal                            pancreatic duct. The ERCP was technically difficult                            and complex due to challenging cannulation. The                            patient tolerated the procedure well. Scope In: Scope Out: Findings:      1. The side-viewing endoscope was passed blindly into the esophagus.       There were no gross abnormalities of the stomach postbulbar duodenum.      2. A bulbous ampulla was located on the side of a large diverticulum.      3. A scout film of the abdomen was obtained. Surgical clips, consistent       with a previous cholecystectomy, were seen in the area of the right       upper quadrant of the abdomen.      4. Ampulla to be approached in the long position      5. Partial pancreatogram was unremarkable      6. Numerous attempts, the common bile duct could not be cannulated.       There was a suspicion that the stone noted on MRCP represented the most       portion of the duct. Impression:               1. The side-viewing endoscope was passed  blindly                            into the esophagus. There were no gross                            abnormalities of the stomach postbulbar duodenum.                           2. A bulbous ampulla was located on the side of a                            large diverticulum.                           3. A scout film of the abdomen was obtained.                            Surgical clips, consistent with a previous                            cholecystectomy, were seen in the area of the right                            upper quadrant of the abdomen.                           4.  Ampulla to be approached in the long position                           5. Partial pancreatogram was unremarkable                           6. Numerous attempts, the common bile duct could                            not be cannulated. There was a suspicion that the                            stone noted on MRCP represented the most portion of                            the duct. Moderate Sedation:      none Recommendation:           1. Observe patient's clinical course.                           2. Repeat ERCP tomorrow with Dr. Rush Landmark                            advanced therapeutic endoscopist. The capacity for                            precut is available if needed Procedure Code(s):        ---  Professional ---                           [phone], Endoscopic retrograde                            cholangiopancreatography (ERCP); diagnostic,                            including collection of specimen(s) by brushing or                            washing, when performed (separate procedure) Diagnosis Code(s):        --- Professional ---                           K80.50, Calculus of bile duct without cholangitis                            or cholecystitis without obstruction                           R74.8, Abnormal levels of other serum enzymes CPT copyright 2019 [employer]. All rights reserved. The codes documented in this report are preliminary and upon coder review may  be revised to meet current compliance requirements. Docia Chuck. Henrene Pastor, MD 05/06/2022 11:08:16 AM This report has been signed electronically. Number of Addenda: 0

## 2022-05-06 NOTE — Anesthesia Procedure Notes (Signed)
Procedure Name: Intubation Date/Time: 05/06/2022 9:54 AM  Performed by: Montel Clock, CRNAPre-anesthesia Checklist: Patient identified, Emergency Drugs available, Suction available, Patient being monitored and Timeout performed Patient Re-evaluated:Patient Re-evaluated prior to induction Oxygen Delivery Method: Circle system utilized Preoxygenation: Pre-oxygenation with 100% oxygen Induction Type: IV induction Ventilation: Mask ventilation without difficulty Laryngoscope Size: Mac and 3 Grade View: Grade I Tube type: Oral Tube size: 7.0 mm Number of attempts: 1 Airway Equipment and Method: Stylet Placement Confirmation: ETT inserted through vocal cords under direct vision, positive ETCO2 and breath sounds checked- equal and bilateral Secured at: 21 cm Tube secured with: Tape Dental Injury: Teeth and Oropharynx as per pre-operative assessment

## 2022-05-06 NOTE — Transfer of Care (Signed)
Immediate Anesthesia Transfer of Care Note  Patient: Sheila Wood  Procedure(s) Performed: ENDOSCOPIC RETROGRADE CHOLANGIOPANCREATOGRAPHY (ERCP)  Patient Location: PACU  Anesthesia Type:General  Level of Consciousness: drowsy and patient cooperative  Airway & Oxygen Therapy: Patient Spontanous Breathing and Patient connected to face mask oxygen  Post-op Assessment: Report given to RN and Post -op Vital signs reviewed and stable  Post vital signs: Reviewed and stable  Last Vitals:  Vitals Value Taken Time  BP 155/79 05/06/22 1107  Temp 35.9 C 05/06/22 1107  Pulse 70 05/06/22 1110  Resp 12 05/06/22 1110  SpO2 99 % 05/06/22 1110  Vitals shown include unvalidated device data.  Last Pain:  Vitals:   05/06/22 0842  TempSrc: Temporal  PainSc: 0-No pain         Complications: No notable events documented.

## 2022-05-06 NOTE — Plan of Care (Signed)
  Problem: Education: Goal: Knowledge of General Education information will improve Description Including pain rating scale, medication(s)/side effects and non-pharmacologic comfort measures Outcome: Progressing   

## 2022-05-06 NOTE — Interval H&P Note (Signed)
History and Physical Interval Note:  05/06/2022 9:42 AM  Sheila Wood  has presented today for surgery, with the diagnosis of Choledocholithiasis, elevated LFT.  The various methods of treatment have been discussed with the patient and family. After consideration of risks, benefits and other options for treatment, the patient has consented to  Procedure(s): ENDOSCOPIC RETROGRADE CHOLANGIOPANCREATOGRAPHY (ERCP) (N/A) as a surgical intervention.  The patient's history has been reviewed, patient examined, no change in status, stable for surgery.  I have reviewed the patient's chart and labs.  Questions were answered to the patient's satisfaction.     Sheila Wood

## 2022-05-07 ENCOUNTER — Encounter (HOSPITAL_COMMUNITY)
Admission: EM | Disposition: A | Discharge status: Home / Self Care | Payer: Self-pay | Source: Home / Self Care | Attending: Internal Medicine

## 2022-05-07 ENCOUNTER — Inpatient Hospital Stay (HOSPITAL_COMMUNITY): Payer: Medicare Other | Admitting: Anesthesiology

## 2022-05-07 ENCOUNTER — Inpatient Hospital Stay (HOSPITAL_COMMUNITY): Payer: Medicare Other

## 2022-05-07 ENCOUNTER — Encounter (HOSPITAL_COMMUNITY): Payer: Self-pay | Admitting: Urology

## 2022-05-07 DIAGNOSIS — K2970 Gastritis, unspecified, without bleeding: Secondary | ICD-10-CM

## 2022-05-07 DIAGNOSIS — K8051 Calculus of bile duct without cholangitis or cholecystitis with obstruction: Secondary | ICD-10-CM | POA: Diagnosis not present

## 2022-05-07 DIAGNOSIS — K8050 Calculus of bile duct without cholangitis or cholecystitis without obstruction: Secondary | ICD-10-CM

## 2022-05-07 HISTORY — PX: ESOPHAGOGASTRODUODENOSCOPY: SHX5428

## 2022-05-07 HISTORY — PX: BIOPSY: SHX5522

## 2022-05-07 HISTORY — PX: REMOVAL OF STONES: SHX5545

## 2022-05-07 HISTORY — PX: ERCP: SHX5425

## 2022-05-07 HISTORY — PX: EUS: SHX5427

## 2022-05-07 HISTORY — PX: SPHINCTEROTOMY: SHX5544

## 2022-05-07 LAB — CBC WITH DIFFERENTIAL/PLATELET
Abs Immature Granulocytes: 0.01 10*3/uL (ref 0.00–0.07)
Basophils Absolute: 0 10*3/uL (ref 0.0–0.1)
Basophils Relative: 0 %
Eosinophils Absolute: 0 10*3/uL (ref 0.0–0.5)
Eosinophils Relative: 1 %
HCT: 37 % (ref 36.0–46.0)
Hemoglobin: 11.8 g/dL — ABNORMAL LOW (ref 12.0–15.0)
Immature Granulocytes: 0 %
Lymphocytes Relative: 25 %
Lymphs Abs: 1.5 10*3/uL (ref 0.7–4.0)
MCH: 30.8 pg (ref 26.0–34.0)
MCHC: 31.9 g/dL (ref 30.0–36.0)
MCV: 96.6 fL (ref 80.0–100.0)
Monocytes Absolute: 1 10*3/uL (ref 0.1–1.0)
Monocytes Relative: 16 %
Neutro Abs: 3.6 10*3/uL (ref 1.7–7.7)
Neutrophils Relative %: 58 %
Platelets: 177 10*3/uL (ref 150–400)
RBC: 3.83 MIL/uL — ABNORMAL LOW (ref 3.87–5.11)
RDW: 13.8 % (ref 11.5–15.5)
WBC: 6.1 10*3/uL (ref 4.0–10.5)
nRBC: 0 % (ref 0.0–0.2)

## 2022-05-07 LAB — COMPREHENSIVE METABOLIC PANEL
ALT: 235 U/L — ABNORMAL HIGH (ref 0–44)
AST: 78 U/L — ABNORMAL HIGH (ref 15–41)
Albumin: 3.2 g/dL — ABNORMAL LOW (ref 3.5–5.0)
Alkaline Phosphatase: 388 U/L — ABNORMAL HIGH (ref 38–126)
Anion gap: 7 (ref 5–15)
BUN: 14 mg/dL (ref 8–23)
CO2: 23 mmol/L (ref 22–32)
Calcium: 8.7 mg/dL — ABNORMAL LOW (ref 8.9–10.3)
Chloride: 108 mmol/L (ref 98–111)
Creatinine, Ser: 0.56 mg/dL (ref 0.44–1.00)
GFR, Estimated: >60 mL/min (ref >60)
Glucose, Bld: 97 mg/dL (ref 70–99)
Potassium: 4.1 mmol/L (ref 3.5–5.1)
Sodium: 138 mmol/L (ref 135–145)
Total Bilirubin: 0.8 mg/dL (ref 0.3–1.2)
Total Protein: 5.9 g/dL — ABNORMAL LOW (ref 6.5–8.1)

## 2022-05-07 LAB — LIPASE, BLOOD: Lipase: 126 U/L — ABNORMAL HIGH (ref 11–51)

## 2022-05-07 LAB — MAGNESIUM: Magnesium: 1.9 mg/dL (ref 1.7–2.4)

## 2022-05-07 SURGERY — ERCP, WITH INTERVENTION IF INDICATED
Anesthesia: General

## 2022-05-07 MED ORDER — PHENYLEPHRINE HCL (PRESSORS) 10 MG/ML IV SOLN
INTRAVENOUS | Status: AC
  Filled 2022-05-07: qty 1

## 2022-05-07 MED ORDER — DICLOFENAC SUPPOSITORY 100 MG: 100.0000 mg | Freq: Once | RECTAL | Status: DC

## 2022-05-07 MED ORDER — ENOXAPARIN SODIUM 40 MG/0.4ML IJ SOSY: 40.0000 mg | PREFILLED_SYRINGE | INTRAMUSCULAR | Status: DC

## 2022-05-07 MED ORDER — SODIUM CHLORIDE 0.9 % IV SOLN
INTRAVENOUS | Status: DC | PRN
  Administered 2022-05-07: 15 mL

## 2022-05-07 MED ORDER — FENTANYL CITRATE (PF) 100 MCG/2ML IJ SOLN
INTRAMUSCULAR | Status: AC
  Filled 2022-05-07: qty 2

## 2022-05-07 MED ORDER — INDOMETHACIN 50 MG RE SUPP
RECTAL | Status: AC
  Filled 2022-05-07: qty 2

## 2022-05-07 MED ORDER — LIDOCAINE 2% (20 MG/ML) 5 ML SYRINGE
INTRAMUSCULAR | Status: DC | PRN
  Administered 2022-05-07: 60 mg via INTRAVENOUS

## 2022-05-07 MED ORDER — LACTATED RINGERS IV SOLN: INTRAVENOUS | Status: DC | PRN

## 2022-05-07 MED ORDER — FENTANYL CITRATE (PF) 100 MCG/2ML IJ SOLN
INTRAMUSCULAR | Status: DC | PRN
  Administered 2022-05-07 (×2): 25 ug via INTRAVENOUS
  Administered 2022-05-07: 50 ug via INTRAVENOUS

## 2022-05-07 MED ORDER — CIPROFLOXACIN IN D5W 400 MG/200ML IV SOLN
INTRAVENOUS | Status: AC
  Filled 2022-05-07: qty 200

## 2022-05-07 MED ORDER — ROCURONIUM BROMIDE 10 MG/ML (PF) SYRINGE
PREFILLED_SYRINGE | INTRAVENOUS | Status: DC | PRN
  Administered 2022-05-07: 60 mg via INTRAVENOUS

## 2022-05-07 MED ORDER — PHENYLEPHRINE HCL-NACL 20-0.9 MG/250ML-% IV SOLN: INTRAVENOUS | Status: DC | PRN

## 2022-05-07 MED ORDER — DICLOFENAC SUPPOSITORY 100 MG
RECTAL | Status: AC
  Filled 2022-05-07: qty 1

## 2022-05-07 MED ORDER — PROPOFOL 10 MG/ML IV BOLUS
INTRAVENOUS | Status: DC | PRN
  Administered 2022-05-07: 80 mg via INTRAVENOUS

## 2022-05-07 MED ORDER — GLUCAGON HCL RDNA (DIAGNOSTIC) 1 MG IJ SOLR
INTRAMUSCULAR | Status: AC
  Filled 2022-05-07: qty 2

## 2022-05-07 MED ORDER — PANTOPRAZOLE SODIUM 40 MG PO TBEC
40.0000 mg | DELAYED_RELEASE_TABLET | Freq: Every day | ORAL | Status: DC
  Administered 2022-05-07 – 2022-05-08 (×2): 40 mg via ORAL
  Filled 2022-05-07 (×2): qty 1

## 2022-05-07 MED ORDER — PHENYLEPHRINE HCL-NACL 20-0.9 MG/250ML-% IV SOLN
INTRAVENOUS | Status: DC | PRN
  Administered 2022-05-07: 25 ug/min via INTRAVENOUS

## 2022-05-07 MED ORDER — SODIUM CHLORIDE 0.9 % IV SOLN: INTRAVENOUS | Status: DC

## 2022-05-07 MED ORDER — DICLOFENAC SUPPOSITORY 100 MG
RECTAL | Status: DC | PRN
  Administered 2022-05-07: 100 mg via RECTAL

## 2022-05-07 MED ORDER — GLUCAGON HCL RDNA (DIAGNOSTIC) 1 MG IJ SOLR
INTRAMUSCULAR | Status: DC | PRN
  Administered 2022-05-07 (×2): .25 mg via INTRAVENOUS

## 2022-05-07 MED ORDER — DEXAMETHASONE SODIUM PHOSPHATE 10 MG/ML IJ SOLN
INTRAMUSCULAR | Status: DC | PRN
  Administered 2022-05-07: 5 mg via INTRAVENOUS

## 2022-05-07 MED ORDER — SUGAMMADEX SODIUM 200 MG/2ML IV SOLN
INTRAVENOUS | Status: DC | PRN
  Administered 2022-05-07: 200 mg via INTRAVENOUS

## 2022-05-07 MED ORDER — ONDANSETRON HCL 4 MG/2ML IJ SOLN
INTRAMUSCULAR | Status: DC | PRN
  Administered 2022-05-07: 4 mg via INTRAVENOUS

## 2022-05-07 NOTE — Anesthesia Procedure Notes (Signed)
Date/Time: 05/07/2022 4:18 PM  Performed by: Cynda Familia, CRNAOxygen Delivery Method: Simple face mask Placement Confirmation: positive ETCO2 and breath sounds checked- equal and bilateral Dental Injury: Teeth and Oropharynx as per pre-operative assessment

## 2022-05-07 NOTE — Op Note (Signed)
[facility] Patient Name: Sheila Wood Procedure Date: 05/07/2022 MRN: [id number] Attending MD: Justice Britain , MD Date of Birth: 04/25/1935 CSN: [id number] Age: 87 Admit Type: Inpatient Procedure:                ERCP Indications:              Bile duct stone(s), Biliary dilation on magnetic                            resonance cholangiopancreatography, Bile duct stone                            on magnetic resonance cholangiopancreatography,                            Abnormal liver function test Providers:                Justice Britain, MD, Burtis Junes, RN, Cletis Athens,                            Technician Referring MD:             Docia Chuck. Henrene Pastor, MD, Gatha Mayer, MD, inpatient                            medicine team Medicines:                General Anesthesia, Cipro 400 mg IV, Indomethacin                            100 mg PR, Glucagon 0.5 mg IV Complications:            No immediate complications. Estimated Blood Loss:     Estimated blood loss was minimal. Procedure:                Pre-Anesthesia Assessment:                           - Prior to the procedure, a History and Physical                            was performed, and patient medications and                            allergies were reviewed. The patient's tolerance of                            previous anesthesia was also reviewed. The risks                            and benefits of the procedure and the sedation                            options and risks were discussed with the patient.  All questions were answered, and informed consent                            was obtained. Prior Anticoagulants: The patient has                            taken no previous anticoagulant or antiplatelet                            agents. ASA Grade Assessment: III - A patient with                            severe systemic disease. After reviewing the risks                             and benefits, the patient was deemed in                            satisfactory condition to undergo the procedure.                           After obtaining informed consent, the scope was                            passed under direct vision. Throughout the                            procedure, the patient's blood pressure, pulse, and                            oxygen saturations were monitored continuously. The                            TJF-Q190V ([id number]) Olympus duodenoscope was                            introduced through the mouth, and used to inject                            contrast into and used to inject contrast into the                            bile duct. The ERCP was accomplished without                            difficulty. The patient tolerated the procedure. Scope In: Scope Out: Findings:      A scout film of the abdomen was obtained. Surgical clips, consistent       with a previous cholecystectomy, were seen in the area of the right       upper quadrant of the abdomen.      The esophagus was successfully intubated under direct vision without       detailed examination of the pharynx, larynx, and associated structures.  The major papilla was on the rim of a diverticulum. The major papilla       was prominent. Short positioning did not initially allow for adequate       sphincterotome placement, thus a long-position was utilized. A short       0.035 inch Soft Jagwire was passed into the biliary tree with what       appeared to be a sigmoidized pattern (due to long positioning). The       Hydratome sphincterotome was passed over the guidewire and the bile duct       was then deeply cannulated. Contrast was injected. I personally       interpreted the bile duct images. Ductal flow of contrast was adequate.       Image quality was adequate. Contrast extended to the hepatic ducts.       Opacification of the entire biliary tree except for the gallbladder was        successful. The main bile duct was moderately dilated. The largest       diameter was 15 mm. The lower third of the main bile duct contained a       filling defect thought to be a stone. A 10 mm biliary sphincterotomy was       made with a monofilament Hydratome sphincterotome using ERBE       electrocautery. There was a very mild self limited oozing from the       sphincterotomy which did not require treatment. To discover objects, the       biliary tree was swept with a retrieval balloon. Sludge was swept from       the duct. One stone was removed. No stones remained. An occlusion       cholangiogram was performed that showed no further significant biliary       pathology.      A pancreatogram was not performed.      The duodenoscope was withdrawn from the patient. Impression:               - The major papilla was on the rim of a                            diverticulum.                           - The major papilla appeared to be prominent.                           - Long positioning was best position initially                            before we were able to reduce the scope.                           - A filling defect consistent with a stone was seen                            on the cholangiogram.                           - The entire main bile duct was moderately dilated.                           -  Choledocholithiasis was found. Complete removal                            was accomplished by biliary sphincterotomy and                            balloon trawl. Moderate Sedation:      Not Applicable - Patient had care per Anesthesia. Recommendation:           - The patient will be observed post-procedure,                            until all discharge criteria are met.                           - Discharge patient to home.                           - Watch for pancreatitis, bleeding, perforation,                            and cholangitis.                           - Check  liver enzymes (AST, ALT, alkaline                            phosphatase, bilirubin) in the morning.                           - Observe patient's clinical course.                           - Would hold chemical VTE prophylaxis for 24 hours                            to decrease risk of post-interventional bleeding.                            Minimize NSAIDs for 1-2 weeks as able.                           - Advance diet as tolerated from full liquid (order                            in place).                           - The findings and recommendations were discussed                            with the patient.                           - The findings and recommendations were discussed  with the patient's family.                           - The findings and recommendations were discussed                            with the referring physician. Procedure Code(s):        --- Professional ---                           [id number], Endoscopic retrograde                            cholangiopancreatography (ERCP); with removal of                            calculi/debris from biliary/pancreatic duct(s)                           43262, Endoscopic retrograde                            cholangiopancreatography (ERCP); with                            sphincterotomy/papillotomy Diagnosis Code(s):        --- Professional ---                           K83.8, Other specified diseases of biliary tract                           K80.50, Calculus of bile duct without cholangitis                            or cholecystitis without obstruction                           K83.9, Disease of biliary tract, unspecified                           R94.5, Abnormal results of liver function studies                           R93.2, Abnormal findings on diagnostic imaging of                            liver and biliary tract CPT copyright 2019 American Medical Association. All rights reserved. The  codes documented in this report are preliminary and upon coder review may  be revised to meet current compliance requirements. Justice Britain, MD 05/07/2022 4:18:57 PM Number of Addenda: 0

## 2022-05-07 NOTE — Transfer of Care (Signed)
Immediate Anesthesia Transfer of Care Note  Patient: Sheila Wood  Procedure(s) Performed: ENDOSCOPIC RETROGRADE CHOLANGIOPANCREATOGRAPHY (ERCP) UPPER ENDOSCOPIC ULTRASOUND (EUS) LINEAR ESOPHAGOGASTRODUODENOSCOPY (EGD) BIOPSY SPHINCTEROTOMY REMOVAL OF STONES  Patient Location: PACU and Endoscopy Unit  Anesthesia Type:General  Level of Consciousness: awake and alert   Airway & Oxygen Therapy: Patient Spontanous Breathing and Patient connected to face mask oxygen  Post-op Assessment: Report given to RN and Post -op Vital signs reviewed and stable  Post vital signs: Reviewed and stable  Last Vitals:  Vitals Value Taken Time  BP 154/69 05/07/22 1626  Temp 36.3 C 05/07/22 1626  Pulse 50 05/07/22 1627  Resp 16 05/07/22 1627  SpO2 100 % 05/07/22 1627  Vitals shown include unvalidated device data.  Last Pain:  Vitals:   05/07/22 1626  TempSrc: Temporal  PainSc:          Complications: No notable events documented.

## 2022-05-07 NOTE — Anesthesia Preprocedure Evaluation (Signed)
Anesthesia Evaluation  Patient identified by MRN, date of birth, ID band Patient awake    Reviewed: Allergy & Precautions, NPO status , Patient's Chart, lab work & pertinent test results  Airway Mallampati: III  TM Distance: >3 FB Neck ROM: Full    Dental  (+) Dental Advisory Given, Teeth Intact   Pulmonary neg pulmonary ROS,    Pulmonary exam normal        Cardiovascular negative cardio ROS Normal cardiovascular exam     Neuro/Psych negative neurological ROS     GI/Hepatic Neg liver ROS, Choledocholithiasis    Endo/Other  negative endocrine ROS  Renal/GU negative Renal ROS     Musculoskeletal negative musculoskeletal ROS (+)   Abdominal   Peds  Hematology  (+) Blood dyscrasia, anemia ,   Anesthesia Other Findings Choledocholithiasis s/p failed ERCP on 9/17, planning for repeat ERCP with Dr. Rush Landmark  Reproductive/Obstetrics                            Anesthesia Physical  Anesthesia Plan  ASA: 2  Anesthesia Plan: General   Post-op Pain Management: Minimal or no pain anticipated   Induction: Intravenous  PONV Risk Score and Plan: 3 and Ondansetron, Dexamethasone and Treatment may vary due to age or medical condition  Airway Management Planned: Oral ETT  Additional Equipment:   Intra-op Plan:   Post-operative Plan: Extubation in OR  Informed Consent: I have reviewed the patients History and Physical, chart, labs and discussed the procedure including the risks, benefits and alternatives for the proposed anesthesia with the patient or authorized representative who has indicated his/her understanding and acceptance.     Dental advisory given  Plan Discussed with:   Anesthesia Plan Comments:        Anesthesia Quick Evaluation

## 2022-05-07 NOTE — Plan of Care (Signed)
  Problem: Coping: Goal: Level of anxiety will decrease Outcome: Progressing   Problem: Pain Managment: Goal: General experience of comfort will improve Outcome: Progressing   

## 2022-05-07 NOTE — Op Note (Signed)
[facility] Patient Name: Sheila Wood Procedure Date: 05/07/2022 MRN: [id number] Attending MD: Justice Britain , MD Date of Birth: August 28, 1934 CSN: [id number] Age: 87 Admit Type: Inpatient Procedure:                Upper EUS Indications:              Suspected choledocholithiasis, Epigastric abdominal                            pain Providers:                Justice Britain, MD, Burtis Junes, RN, Cletis Athens,                            Technician Referring MD:              Medicines:                General Anesthesia Complications:            No immediate complications. Estimated Blood Loss:     Estimated blood loss was minimal. Procedure:                Pre-Anesthesia Assessment:                           - Prior to the procedure, a History and Physical                            was performed, and patient medications and                            allergies were reviewed. The patient's tolerance of                            previous anesthesia was also reviewed. The risks                            and benefits of the procedure and the sedation                            options and risks were discussed with the patient.                            All questions were answered, and informed consent                            was obtained. Prior Anticoagulants: The patient has                            taken no previous anticoagulant or antiplatelet                            agents. ASA Grade Assessment: III - A patient with                            severe systemic disease. After reviewing the  risks                            and benefits, the patient was deemed in                            satisfactory condition to undergo the procedure.                           After obtaining informed consent, the endoscope was                            passed under direct vision. Throughout the                            procedure, the patient's blood pressure, pulse, and                             oxygen saturations were monitored continuously. The                            [id number]-[id number] ([id number]) Olympus endoscope was introduced                            through the mouth, and advanced to the second part                            of duodenum. The [id number]-[id number] ([id number]) Olympus                            duodenoscope was introduced through the mouth, and                            advanced to the area of papilla. The [id number]                            ([id number]) Olympus linear ultrasound scope was                            introduced through the mouth, and advanced to the                            duodenum for ultrasound examination. The upper EUS                            was accomplished without difficulty. The patient                            tolerated the procedure. Scope In: Scope Out: Findings:      ENDOSCOPIC FINDING: :      No gross lesions were noted in the proximal esophagus and in the mid       esophagus.      Scattered islands of salmon-colored mucosa were present from 37 to 39 cm       (distal  esophagus). No other visible abnormalities were present.       Biopsies were taken with a cold forceps for histology.      The Z-line was irregular and was found 39 cm from the incisors.      Multiple dispersed small erosions with no stigmata of recent bleeding       were found in the entire examined stomach. Biopsies were taken with a       cold forceps for histology and Helicobacter pylori testing.      Localized mucosal changes characterized by granularity and altered       texture were found at the incisura. Biopsies were taken with a cold       forceps for histology to rule out adenomatous change and intestinal       metaplasia.      No gross lesions were noted in the duodenal bulb, in the first portion       of the duodenum and in the second portion of the duodenum.      A medium diverticulum was found in the region of the papilla.      Major  papillary prominence was noted (was on the rim of the noted       diverticulum).      ENDOSONOGRAPHIC FINDING: :      There was dilation in the common bile duct, in the common hepatic duct       and in the cystic duct which measured up to 14 mm.      One stone was visualized endosonographically in the common bile duct.       The stone measured 14 mm in greatest dimension. The stone was round. It       was hyperechoic and characterized by shadowing.      Endosonographic imaging of the ampulla showed no intramural       (subepithelial) lesion. Impression:               EGD impression:                           - No gross lesions in esophagus proximally.                            Salmon-colored mucosal islands suspicious for                            short-segment Barrett's esophagus distally ?"                            biopsied.                           - Z-line irregular, 39 cm from the incisors.                           - Erosive gastropathy with no stigmata of recent                            bleeding. Biopsied.                           - Granular and texture changed mucosa in the  incisura biopsy to rule out adenomatous change and                            intestinal metaplasia.                           - No gross lesions in the duodenal bulb, in the                            first portion of the duodenum and in the second                            portion of the duodenum.                           - Duodenal diverticulum.                           - Major papillary prominence noted.                           EUS impression:                           - There was dilation in the common bile duct, in                            the common hepatic duct and in the cystic duct                            which measured up to 14 mm.                           - One stone was visualized endosonographically in                            the common bile  duct.                           - No evidence of an intramural ampullary lesion. Moderate Sedation:      Not Applicable - Patient had care per Anesthesia. Recommendation:           - Proceed to scheduled ERCP for attempted stone                            extraction.                           - Observe patient's clinical course.                           - Await path results.                           - Initiate pantoprazole 40 mg daily                           -  Follow-up pathology to determine any surveillance                            that may be required. If there is evidence of                            adenomatous change or dysplastic change at incisura                            could require ESD.                           - The findings and recommendations were discussed                            with the patient.                           - The findings and recommendations were discussed                            with the patient's family. Procedure Code(s):        --- Professional ---                           [phone], Esophagogastroduodenoscopy, flexible,                            transoral; with endoscopic ultrasound examination                            limited to the esophagus, stomach or duodenum, and                            adjacent structures                           43239, Esophagogastroduodenoscopy, flexible,                            transoral; with biopsy, single or multiple Diagnosis Code(s):        --- Professional ---                           K22.8, Other specified diseases of esophagus                           K31.89, Other diseases of stomach and duodenum                           K80.50, Calculus of bile duct without cholangitis                            or cholecystitis without obstruction                           R10.13,  Epigastric pain                           K57.10, Diverticulosis of small intestine without                             perforation or abscess without bleeding                           K83.8, Other specified diseases of biliary tract CPT copyright 2019 [employer]. All rights reserved. The codes documented in this report are preliminary and upon coder review may  be revised to meet current compliance requirements. Justice Britain, MD 05/07/2022 4:24:23 PM Number of Addenda: 0

## 2022-05-07 NOTE — Anesthesia Procedure Notes (Signed)
Procedure Name: Intubation Date/Time: 05/07/2022 3:08 PM  Performed by: Cynda Familia, CRNAPre-anesthesia Checklist: Patient identified, Emergency Drugs available, Suction available and Patient being monitored Patient Re-evaluated:Patient Re-evaluated prior to induction Oxygen Delivery Method: Circle System Utilized Preoxygenation: Pre-oxygenation with 100% oxygen Induction Type: IV induction Ventilation: Mask ventilation without difficulty Laryngoscope Size: Miller and 2 Grade View: Grade I Tube type: Oral Number of attempts: 1 Airway Equipment and Method: Stylet Placement Confirmation: ETT inserted through vocal cords under direct vision, positive ETCO2 and breath sounds checked- equal and bilateral Secured at: 21 cm Tube secured with: Tape Dental Injury: Teeth and Oropharynx as per pre-operative assessment  Comments: Smooth IV induction Witman-- intubation AM CRNA atraumatic-- teeth and mouth as preop-- lower tooth loose upon exam after induction- unchanged with laryngoscopy- bite block by RN

## 2022-05-07 NOTE — Progress Notes (Signed)
Progress Note    Sheila Wood   ZOX:[id number]  DOB: 26-Jun-1935  DOA: 05/04/2022     2 PCP: Marin Olp, MD  Initial CC: Referred to the hospital for choledocholithiasis  Hospital Course: Sheila Wood is a 87 y.o. female with medical history significant for HLD, s/p cholecystectomy 2007 who is admitted for management of obstructive choledocholithiasis. She has had ongoing outpatient work-up for elevated LFTs since approximately July 2023.  She ultimately underwent MRCP which was found to show a 12 mm choledocholithiasis at the ampulla.  She was referred to the hospital for admission and evaluation by GI.  Interval History:  No events overnight.  Patient to undergo repeat ERCP this afternoon.  Pending findings, will probably stay overnight to monitor LFT response.  Assessment and Plan: * Choledocholithiasis with obstruction - Outpatient MRCP showed biliary obstruction secondary to 12 mm choledocholithiasis at the ampulla as cause of her elevated LFTs.  Very mild peribiliary enhancement is likely reactive.  Doubt cholangitis without fever, abdominal pain, or leukocytosis.  No indication for antibiotics at this time. -Appreciate GI evaluation.  ERCP on Sunday unable to cannulate CBD; re-attempt planned for 9/18    Old records reviewed in assessment of this patient  Antimicrobials:   DVT prophylaxis:  enoxaparin (LOVENOX) injection 40 mg Start: 05/07/22 2200   Code Status:   Code Status: Full Code  Mobility Assessment (last 72 hours)     Mobility Assessment     Row Name 05/07/22 0710 05/07/22 0100 05/06/22 0749 05/05/22 2100 05/05/22 0941   Does patient have an order for bedrest or is patient medically unstable No - Continue assessment No - Continue assessment No - Continue assessment No - Continue assessment No - Continue assessment   What is the highest level of mobility based on the progressive mobility assessment? Level 6 (Walks independently in room and hall) - Balance  while walking in room without assist - Complete Level 6 (Walks independently in room and hall) - Balance while walking in room without assist - Complete Level 6 (Walks independently in room and hall) - Balance while walking in room without assist - Complete Level 6 (Walks independently in room and hall) - Balance while walking in room without assist - Complete Level 6 (Walks independently in room and hall) - Balance while walking in room without assist - Complete    Row Name 05/04/22 2344           Does patient have an order for bedrest or is patient medically unstable No - Continue assessment       What is the highest level of mobility based on the progressive mobility assessment? Level 6 (Walks independently in room and hall) - Balance while walking in room without assist - Complete                Barriers to discharge:  Disposition Plan: Home in 1 to 2 days Status is: Inpatient  Objective: Blood pressure 133/74, pulse 61, temperature 98 F (36.7 C), temperature source Oral, resp. rate 16, height '5\' 5"'$  (1.651 m), weight 52 kg, SpO2 97 %.  Examination:  Physical Exam Constitutional:      Appearance: Normal appearance.  HENT:     Head: Normocephalic and atraumatic.     Mouth/Throat:     Mouth: Mucous membranes are moist.  Eyes:     Extraocular Movements: Extraocular movements intact.  Cardiovascular:     Rate and Rhythm: Normal rate and regular rhythm.  Pulmonary:  Effort: Pulmonary effort is normal.     Breath sounds: Normal breath sounds.  Abdominal:     General: Bowel sounds are normal. There is no distension.     Palpations: Abdomen is soft.     Tenderness: There is no abdominal tenderness.  Musculoskeletal:        General: Normal range of motion.     Cervical back: Normal range of motion and neck supple.  Skin:    General: Skin is warm and dry.  Neurological:     General: No focal deficit present.     Mental Status: She is alert.  Psychiatric:        Mood  and Affect: Mood normal.        Behavior: Behavior normal.      Consultants:  GI  Procedures:  ERCP, 05/06/2022 Repeat ERCP, tentative 05/07/22  Data Reviewed: Results for orders placed or performed during the hospital encounter of 05/04/22 (from the past 24 hour(s))  CBC with Differential/Platelet     Status: Abnormal   Collection Time: 05/07/22  3:52 AM  Result Value Ref Range   WBC 6.1 4.0 - 10.5 K/uL   RBC 3.83 (L) 3.87 - 5.11 MIL/uL   Hemoglobin 11.8 (L) 12.0 - 15.0 g/dL   HCT 37.0 36.0 - 46.0 %   MCV 96.6 80.0 - 100.0 fL   MCH 30.8 26.0 - 34.0 pg   MCHC 31.9 30.0 - 36.0 g/dL   RDW 13.8 11.5 - 15.5 %   Platelets 177 150 - 400 K/uL   nRBC 0.0 0.0 - 0.2 %   Neutrophils Relative % 58 %   Neutro Abs 3.6 1.7 - 7.7 K/uL   Lymphocytes Relative 25 %   Lymphs Abs 1.5 0.7 - 4.0 K/uL   Monocytes Relative 16 %   Monocytes Absolute 1.0 0.1 - 1.0 K/uL   Eosinophils Relative 1 %   Eosinophils Absolute 0.0 0.0 - 0.5 K/uL   Basophils Relative 0 %   Basophils Absolute 0.0 0.0 - 0.1 K/uL   Immature Granulocytes 0 %   Abs Immature Granulocytes 0.01 0.00 - 0.07 K/uL  Comprehensive metabolic panel     Status: Abnormal   Collection Time: 05/07/22  3:52 AM  Result Value Ref Range   Sodium 138 135 - 145 mmol/L   Potassium 4.1 3.5 - 5.1 mmol/L   Chloride 108 98 - 111 mmol/L   CO2 23 22 - 32 mmol/L   Glucose, Bld 97 70 - 99 mg/dL   BUN 14 8 - 23 mg/dL   Creatinine, Ser 0.56 0.44 - 1.00 mg/dL   Calcium 8.7 (L) 8.9 - 10.3 mg/dL   Total Protein 5.9 (L) 6.5 - 8.1 g/dL   Albumin 3.2 (L) 3.5 - 5.0 g/dL   AST 78 (H) 15 - 41 U/L   ALT 235 (H) 0 - 44 U/L   Alkaline Phosphatase 388 (H) 38 - 126 U/L   Total Bilirubin 0.8 0.3 - 1.2 mg/dL   GFR, Estimated >60 >60 mL/min   Anion gap 7 5 - 15  Magnesium     Status: None   Collection Time: 05/07/22  3:52 AM  Result Value Ref Range   Magnesium 1.9 1.7 - 2.4 mg/dL  Lipase, blood     Status: Abnormal   Collection Time: 05/07/22  3:52 AM  Result  Value Ref Range   Lipase 126 (H) 11 - 51 U/L    I have Reviewed nursing notes, Vitals, and Lab results since pt's  last encounter. Pertinent lab results : see above I have ordered test including BMP, CBC, Mg I have reviewed the last note from staff over past 24 hours I have discussed pt's care plan and test results with nursing staff, case manager   LOS: 2 days   Dwyane Dee, MD [facility] 05/07/2022, 1:26 PM

## 2022-05-07 NOTE — Progress Notes (Signed)
  Transition of Care (TOC) Screening Note   Patient Details  Name: Sheila Wood Date of Birth: 07-12-1935   Transition of Care [facility]) CM/SW Contact:    Lennart Pall, LCSW Phone Number: 05/07/2022, 9:42 AM    Transition of Care Department [facility]) has reviewed patient and no TOC needs have been identified at this time. We will continue to monitor patient advancement through interdisciplinary progression rounds. If new patient transition needs arise, please place a TOC consult.

## 2022-05-07 NOTE — Plan of Care (Signed)
  Problem: Education: Goal: Knowledge of General Education information will improve Description Including pain rating scale, medication(s)/side effects and non-pharmacologic comfort measures Outcome: Progressing   

## 2022-05-07 NOTE — H&P (Signed)
GASTROENTEROLOGY PROCEDURE H&P NOTE   Primary Care Physician: Marin Olp, MD  HPI: Sheila Wood is a 87 y.o. female who presents for repeat ERCP attempt after failed cannulation yesterday.  Patient with abnormal LFTs, imaging concerning for distal choledocholithiasis.  Past Medical History:  Diagnosis Date   History of colonic polyps    Hyperlipidemia    Internal hemorrhoid    Osteoporosis    Shingles    Past Surgical History:  Procedure Laterality Date   CHOLECYSTECTOMY     at time of colectomy   COLONOSCOPY     ESOPHAGOGASTRODUODENOSCOPY     RIGHT COLECTOMY     adenomatous polyp   Current Facility-Administered Medications  Medication Dose Route Frequency Provider Last Rate Last Admin   0.9 %  sodium chloride infusion   Intravenous Continuous Levin Erp, PA       0.9 %  sodium chloride infusion   Intravenous Continuous Irene Shipper, MD 75 mL/hr at 05/07/22 1147 New Bag at 05/07/22 1147   ciprofloxacin (CIPRO) IVPB 400 mg  400 mg Intravenous Once Levin Erp, PA       diclofenac suppository 100 mg  100 mg Rectal Once Levin Erp, PA       enoxaparin (LOVENOX) injection 40 mg  40 mg Subcutaneous Q24H Levin Erp, [place]       ondansetron [facility]) tablet 4 mg  4 mg Oral Q6H PRN Irene Shipper, MD       Or   ondansetron [facility]) injection 4 mg  4 mg Intravenous Q6H PRN Irene Shipper, MD        Current Facility-Administered Medications:    0.9 %  sodium chloride infusion, , Intravenous, Continuous, Lemmon, Lavone Nian, PA   0.9 %  sodium chloride infusion, , Intravenous, Continuous, Irene Shipper, MD, Last Rate: 75 mL/hr at 05/07/22 1147, New Bag at 05/07/22 1147   ciprofloxacin (CIPRO) IVPB 400 mg, 400 mg, Intravenous, Once, Levin Erp, PA   diclofenac suppository 100 mg, 100 mg, Rectal, Once, Levin Erp, PA   enoxaparin (LOVENOX) injection 40 mg, 40 mg, Subcutaneous, Q24H, Lemmon, Lavone Nian, PA    ondansetron (ZOFRAN) tablet 4 mg, 4 mg, Oral, Q6H PRN **OR** ondansetron (ZOFRAN) injection 4 mg, 4 mg, Intravenous, Q6H PRN, Irene Shipper, MD Allergies  Allergen Reactions   Cannabinoids Hives   Penicillins Rash    Did it involve swelling of the face/tongue/throat, SOB, or low BP? No Did it involve sudden or severe rash/hives, skin peeling, or any reaction on the inside of your mouth or nose? Yes Did you need to seek medical attention at a hospital or doctor's office? No When did it last happen?  Several Years Ago     If all above answers are "NO", may proceed with cephalosporin use.     Aspirin    Family History  Problem Relation Age of Onset   Colon cancer Brother    Heart disease Mother        unclear history   Diabetes Sister    Lung cancer Father    Kidney disease Son    Prostate cancer Brother    Social History   Socioeconomic History   Marital status: Married    Spouse name: Not on file   Number of children: 3   Years of education: Not on file   Highest education level: Not on file  Occupational History   Occupation: Retired   Tobacco Use  Smoking status: Never   Smokeless tobacco: Never  Vaping Use   Vaping Use: Never used  Substance and Sexual Activity   Alcohol use: Not Currently    Alcohol/week: 2.0 standard drinks of alcohol    Types: 2 Standard drinks or equivalent per week    Comment: glass of wine- rarely uses   Drug use: No   Sexual activity: Not Currently  Other Topics Concern   Not on file  Social History Narrative   Widowed in Oct 2014. 1 Son lives with her whom is on dialysis; 3 children (daughter, 2 sons) all live close-sees regularly. 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, banking previously, homemaker)      Hobbies: puzzles, church, go for walks      Still drives and handles finances    Social Determinants of Health   Financial Resource Strain: Low Risk   (10/27/2020)   Overall Financial Resource Strain (CARDIA)    Difficulty of Paying Living Expenses: Not hard at all  Food Insecurity: No Food Insecurity (05/04/2022)   Hunger Vital Sign    Worried About Running Out of Food in the Last Year: Never true    [place] in the Last Year: Never true  Transportation Needs: No Transportation Needs (05/04/2022)   PRAPARE - Hydrologist (Medical): No    Lack of Transportation (Non-Medical): No  Physical Activity: Inactive (10/27/2020)   Exercise Vital Sign    Days of Exercise per Week: 0 days    Minutes of Exercise per Session: 0 min  Stress: Stress Concern Present (10/27/2020)   [place]    Feeling of Stress : To some extent  Social Connections: Moderately Integrated (10/27/2020)   Social Connection and Isolation Panel [NHANES]    Frequency of Communication with Friends and Family: More than three times a week    Frequency of Social Gatherings with Friends and Family: Once a week    Attends Religious Services: More than 4 times per year    Active Member of [employer] or Organizations: No    Attends Archivist Meetings: Never    Marital Status: Married  Human resources officer Violence: Not At Risk (10/27/2020)   Humiliation, Afraid, Rape, and Kick questionnaire    Fear of Current or Ex-Partner: No    Emotionally Abused: No    Physically Abused: No    Sexually Abused: No    Physical Exam: Today's Vitals   05/07/22 0023 05/07/22 0053 05/07/22 0425 05/07/22 0752  BP:   133/74   Pulse:   61   Resp:   16   Temp:   98 F (36.7 C)   TempSrc:   Oral   SpO2:   97%   Weight:      Height:      PainSc: 5  Asleep  0-No pain   Body mass index is 19.08 kg/m. GEN: NAD EYE: Sclerae anicteric ENT: MMM CV: Non-tachycardic GI: Soft, NT/ND NEURO:  Alert & Oriented x 3  Lab Results: Recent Labs    05/05/22 0334 05/06/22 0323 05/07/22 0352  WBC  4.3 4.0 6.1  HGB 12.1 11.8* 11.8*  HCT 36.8 35.8* 37.0  PLT 166 176 177   BMET Recent Labs    05/05/22 0334 05/06/22 0323 05/07/22 0352  NA 140 141 138  K 4.5 3.8 4.1  CL 108 109  108  CO2 '27 25 23  '$ GLUCOSE 86 89 97  BUN '17 17 14  '$ CREATININE 0.60 0.70 0.56  CALCIUM 9.3 9.1 8.7*   LFT Recent Labs    05/07/22 0352  PROT 5.9*  ALBUMIN 3.2*  AST 78*  ALT 235*  ALKPHOS 388*  BILITOT 0.8   PT/INR No results for input(s): "LABPROT", "INR" in the last 72 hours.   Impression / Plan: This is a 87 y.o.female who presents for repeat ERCP attempt after failed cannulation yesterday.  Patient with abnormal LFTs, imaging concerning for distal choledocholithiasis.  The risks of an EUS including intestinal perforation, bleeding, infection, aspiration, and medication effects were discussed as was the possibility it may not give a definitive diagnosis if a biopsy is performed.  When a biopsy of the pancreas is done as part of the EUS, there is an additional risk of pancreatitis at the rate of about 1-2%.  It was explained that procedure related pancreatitis is typically mild, although it can be severe and even life threatening, which is why we do not perform random pancreatic biopsies and only biopsy a lesion/area we feel is concerning enough to warrant the risk.  The risks of an ERCP were discussed at length, including but not limited to the risk of perforation, bleeding, abdominal pain, post-ERCP pancreatitis (while usually mild can be severe and even life threatening).  If we are unsuccessful at cannulation, may require percutaneous biliary drain placement and subsequent ERCP rendezvous in the coming weeks.  Hopefully that will not be the case.  Endoscopic ultrasound to be available to evaluate the distal duct.     The risks and benefits of endoscopic evaluation/treatment were discussed with the patient and/or family; these include but are not limited to the risk of perforation, infection,  bleeding, missed lesions, lack of diagnosis, severe illness requiring hospitalization, as well as anesthesia and sedation related illnesses.  The patient's history has been reviewed, patient examined, no change in status, and deemed stable for procedure.  The patient and/or family is agreeable to proceed.    Justice Britain, MD [facility] Office # [phone]

## 2022-05-08 ENCOUNTER — Encounter (HOSPITAL_COMMUNITY): Payer: Self-pay | Admitting: Gastroenterology

## 2022-05-08 DIAGNOSIS — K3189 Other diseases of stomach and duodenum: Secondary | ICD-10-CM

## 2022-05-08 DIAGNOSIS — Z9889 Other specified postprocedural states: Secondary | ICD-10-CM

## 2022-05-08 DIAGNOSIS — K838 Other specified diseases of biliary tract: Secondary | ICD-10-CM

## 2022-05-08 DIAGNOSIS — R7989 Other specified abnormal findings of blood chemistry: Secondary | ICD-10-CM

## 2022-05-08 DIAGNOSIS — K8051 Calculus of bile duct without cholangitis or cholecystitis with obstruction: Secondary | ICD-10-CM | POA: Diagnosis not present

## 2022-05-08 LAB — COMPREHENSIVE METABOLIC PANEL
ALT: 225 U/L — ABNORMAL HIGH (ref 0–44)
AST: 74 U/L — ABNORMAL HIGH (ref 15–41)
Albumin: 3.3 g/dL — ABNORMAL LOW (ref 3.5–5.0)
Alkaline Phosphatase: 390 U/L — ABNORMAL HIGH (ref 38–126)
Anion gap: 7 (ref 5–15)
BUN: 13 mg/dL (ref 8–23)
CO2: 24 mmol/L (ref 22–32)
Calcium: 9.1 mg/dL (ref 8.9–10.3)
Chloride: 107 mmol/L (ref 98–111)
Creatinine, Ser: 0.57 mg/dL (ref 0.44–1.00)
GFR, Estimated: >60 mL/min (ref >60)
Glucose, Bld: 97 mg/dL (ref 70–99)
Potassium: 4.3 mmol/L (ref 3.5–5.1)
Sodium: 138 mmol/L (ref 135–145)
Total Bilirubin: 0.7 mg/dL (ref 0.3–1.2)
Total Protein: 6.1 g/dL — ABNORMAL LOW (ref 6.5–8.1)

## 2022-05-08 LAB — CBC WITH DIFFERENTIAL/PLATELET
Abs Immature Granulocytes: 0.01 10*3/uL (ref 0.00–0.07)
Basophils Absolute: 0 10*3/uL (ref 0.0–0.1)
Basophils Relative: 0 %
Eosinophils Absolute: 0 10*3/uL (ref 0.0–0.5)
Eosinophils Relative: 0 %
HCT: 36.2 % (ref 36.0–46.0)
Hemoglobin: 11.9 g/dL — ABNORMAL LOW (ref 12.0–15.0)
Immature Granulocytes: 0 %
Lymphocytes Relative: 27 %
Lymphs Abs: 1.3 10*3/uL (ref 0.7–4.0)
MCH: 31.3 pg (ref 26.0–34.0)
MCHC: 32.9 g/dL (ref 30.0–36.0)
MCV: 95.3 fL (ref 80.0–100.0)
Monocytes Absolute: 0.3 10*3/uL (ref 0.1–1.0)
Monocytes Relative: 7 %
Neutro Abs: 3.1 10*3/uL (ref 1.7–7.7)
Neutrophils Relative %: 66 %
Platelets: 196 10*3/uL (ref 150–400)
RBC: 3.8 MIL/uL — ABNORMAL LOW (ref 3.87–5.11)
RDW: 13.8 % (ref 11.5–15.5)
WBC: 4.7 10*3/uL (ref 4.0–10.5)
nRBC: 0 % (ref 0.0–0.2)

## 2022-05-08 LAB — MAGNESIUM: Magnesium: 1.9 mg/dL (ref 1.7–2.4)

## 2022-05-08 MED ORDER — PANTOPRAZOLE SODIUM 40 MG PO TBEC: 40.0000 mg | DELAYED_RELEASE_TABLET | Freq: Every day | ORAL | 3 refills | Status: AC

## 2022-05-08 NOTE — Discharge Summary (Signed)
Physician Discharge Summary   Malayna Noori NWG:[id number] DOB: 08/11/1935 DOA: 05/04/2022  PCP: Marin Olp, MD  Admit date: 05/04/2022 Discharge date:  05/08/2022 Barriers to discharge: none  Admitted From: Home Disposition:  Home Discharging physician: Dwyane Dee, MD  Recommendations for Outpatient Follow-up:  Follow up biopsy results Repeat LFTs  Home Health:  Equipment/Devices:   Discharge Condition: stable CODE STATUS: Full Diet recommendation:  Diet Orders (From admission, onward)     Start     Ordered   05/08/22 0000  Diet - low sodium heart healthy        05/08/22 1151   05/07/22 1746  Diet Heart Room service appropriate? Yes; Fluid consistency: Thin  Diet effective now       Question Answer Comment  Room service appropriate? Yes   Fluid consistency: Thin      05/07/22 1745            Hospital Course: Ariyana Faw is a 87 y.o. female with medical history significant for HLD, s/p cholecystectomy 2007 who is admitted for management of obstructive choledocholithiasis. She has had ongoing outpatient work-up for elevated LFTs since approximately July 2023.  She ultimately underwent MRCP which was found to show a 12 mm choledocholithiasis at the ampulla.  She was referred to the hospital for admission and evaluation by GI.  Assessment and Plan: * Choledocholithiasis with obstruction - Outpatient MRCP showed biliary obstruction secondary to 12 mm choledocholithiasis at the ampulla as cause of her elevated LFTs.  Very mild peribiliary enhancement is likely reactive.  Doubt cholangitis without fever, abdominal pain, or leukocytosis.  No indication for antibiotics at this time. -Appreciate GI evaluation.  ERCP on Sunday unable to cannulate CBD; repeat ERCP performed on 05/07/2022 and 14 mm stone able to be removed from CBD -LFTs will need to be repeated in the next 1 to 2 weeks  Erosive gastropathy - Biopsies obtained during ERCP -Protonix initiated per GI  recommendations - GI will follow-up biopsy results       The patient's chronic medical conditions were treated accordingly per the patient's home medication regimen except as noted.  On day of discharge, patient was felt deemed stable for discharge. Patient/family member advised to call PCP or come back to ER if needed.   Principal Diagnosis: Choledocholithiasis with obstruction  Discharge Diagnoses: Active Hospital Problems   Diagnosis Date Noted   Choledocholithiasis with obstruction 05/04/2022    Priority: 1.   Erosive gastropathy 05/08/2022   Choledocholithiasis 05/05/2022    Resolved Hospital Problems  No resolved problems to display.     Discharge Instructions     Diet - low sodium heart healthy   Complete by: As directed    Increase activity slowly   Complete by: As directed       Allergies as of 05/08/2022       Reactions   Cannabinoids Hives   Penicillins Rash   Did it involve swelling of the face/tongue/throat, SOB, or low BP? No Did it involve sudden or severe rash/hives, skin peeling, or any reaction on the inside of your mouth or nose? Yes Did you need to seek medical attention at a hospital or doctor's office? No When did it last happen?  Several Years Ago     If all above answers are "NO", may proceed with cephalosporin use.   Aspirin         Medication List     TAKE these medications    cholecalciferol 25 MCG (1000 UNIT) tablet  Commonly known as: VITAMIN D3 Take 1,000 Units by mouth daily.   pantoprazole 40 MG tablet Commonly known as: PROTONIX Take 1 tablet (40 mg total) by mouth daily. Start taking on: May 09, 2022        Follow-up Information     Marin Olp, MD. Schedule an appointment as soon as possible for a visit in 1 week(s).   Specialty: Family Medicine Why: Repeat LFTs at visit Contact information: [place] [id number] [phone]                Allergies  Allergen  Reactions   Cannabinoids Hives   Penicillins Rash    Did it involve swelling of the face/tongue/throat, SOB, or low BP? No Did it involve sudden or severe rash/hives, skin peeling, or any reaction on the inside of your mouth or nose? Yes Did you need to seek medical attention at a hospital or doctor's office? No When did it last happen?  Several Years Ago     If all above answers are "NO", may proceed with cephalosporin use.     Aspirin     Consultations: GI  Procedures: ERCP 9/17 and 9/18  Discharge Exam: BP (!) 144/73 (BP Location: Left Arm)   Pulse (!) 56   Temp 97.7 F (36.5 C) (Oral)   Resp 17   Ht '5\' 5"'$  (1.651 m)   Wt 54 kg   LMP  (LMP Unknown)   SpO2 97%   BMI 19.80 kg/m  Physical Exam Constitutional:      Appearance: Normal appearance.  HENT:     Head: Normocephalic and atraumatic.     Mouth/Throat:     Mouth: Mucous membranes are moist.  Eyes:     Extraocular Movements: Extraocular movements intact.  Cardiovascular:     Rate and Rhythm: Normal rate and regular rhythm.  Pulmonary:     Effort: Pulmonary effort is normal.     Breath sounds: Normal breath sounds.  Abdominal:     General: Bowel sounds are normal. There is no distension.     Palpations: Abdomen is soft.     Tenderness: There is no abdominal tenderness.  Musculoskeletal:        General: Normal range of motion.     Cervical back: Normal range of motion and neck supple.  Skin:    General: Skin is warm and dry.  Neurological:     General: No focal deficit present.     Mental Status: She is alert.  Psychiatric:        Mood and Affect: Mood normal.        Behavior: Behavior normal.      The results of significant diagnostics from this hospitalization (including imaging, microbiology, ancillary and laboratory) are listed below for reference.   Microbiology: Recent Results (from the past 240 hour(s))  Blood culture (routine x 2)     Status: None (Preliminary result)   Collection Time:  05/04/22  9:21 PM   Specimen: Right Antecubital; Blood  Result Value Ref Range Status   Specimen Description   Final    RIGHT ANTECUBITAL Performed at [facility] [address] [place], [place] [address]    Special Requests   Final    BOTTLES DRAWN AEROBIC AND ANAEROBIC Blood Culture adequate volume Performed at [facility] [address], [place], [place] [address]    Culture   Final    NO GROWTH 3 DAYS Performed at [facility], [place] [address], [place], [place] [address]    Report Status PENDING  Incomplete  Blood culture (routine x 2)     Status: None (Preliminary result)   Collection Time: 05/05/22  3:34 AM   Specimen: Left Antecubital; Blood  Result Value Ref Range Status   Specimen Description   Final    LEFT ANTECUBITAL BLOOD Performed at [facility], [place] [address] [place], [place] [address]    Special Requests   Final    BOTTLES DRAWN AEROBIC AND ANAEROBIC Blood Culture results may not be optimal due to an inadequate volume of blood received in culture bottles Performed at [facility] [address], [place], [place] [address]    Culture   Final    NO GROWTH 3 DAYS Performed at [facility], [place] [address] [place], [place] [address]    Report Status PENDING  Incomplete     Labs: BNP (last 3 results) No results for input(s): "BNP" in the last 8760 hours. Basic Metabolic Panel: Recent Labs  Lab 05/04/22 2208 05/05/22 0334 05/06/22 0323 05/07/22 0352 05/08/22 0338  NA 140 140 141 138 138  K 3.4* 4.5 3.8 4.1 4.3  CL 113* 108 109 108 107  CO2 '24 27 25 23 24  '$ GLUCOSE 83 86 89 97 97  BUN '18 17 17 14 13  '$ CREATININE 0.60 0.60 0.70 0.56 0.57  CALCIUM 8.0* 9.3 9.1 8.7* 9.1  MG  --   --  2.0 1.9 1.9   Liver Function Tests: Recent Labs  Lab 05/04/22 2208 05/05/22 0334 05/06/22 0323 05/07/22 0352 05/08/22 0338  AST 160* 160* 113* 78* 74*  ALT 298* 346* 281* 235* 225*   ALKPHOS 375* 442* 432* 388* 390*  BILITOT 0.7 0.8 0.9 0.8 0.7  PROT 5.1* 6.2* 6.0* 5.9* 6.1*  ALBUMIN 2.9* 3.5 3.3* 3.2* 3.3*   Recent Labs  Lab 05/04/22 2208 05/07/22 0352  LIPASE 41 126*   No results for input(s): "AMMONIA" in the last 168 hours. CBC: Recent Labs  Lab 05/04/22 2110 05/05/22 0334 05/06/22 0323 05/07/22 0352 05/08/22 0338  WBC 5.9 4.3 4.0 6.1 4.7  NEUTROABS 2.8  --  1.6* 3.6 3.1  HGB 13.3 12.1 11.8* 11.8* 11.9*  HCT 40.1 36.8 35.8* 37.0 36.2  MCV 94.8 96.1 95.0 96.6 95.3  PLT 215 166 176 177 196   Cardiac Enzymes: No results for input(s): "CKTOTAL", "CKMB", "CKMBINDEX", "TROPONINI" in the last 168 hours. BNP: Invalid input(s): "POCBNP" CBG: No results for input(s): "GLUCAP" in the last 168 hours. D-Dimer No results for input(s): "DDIMER" in the last 72 hours. Hgb A1c No results for input(s): "HGBA1C" in the last 72 hours. Lipid Profile No results for input(s): "CHOL", "HDL", "LDLCALC", "TRIG", "CHOLHDL", "LDLDIRECT" in the last 72 hours. Thyroid function studies No results for input(s): "TSH", "T4TOTAL", "T3FREE", "THYROIDAB" in the last 72 hours.  Invalid input(s): "FREET3" Anemia work up No results for input(s): "VITAMINB12", "FOLATE", "FERRITIN", "TIBC", "IRON", "RETICCTPCT" in the last 72 hours. Urinalysis    Component Value Date/Time   COLORURINE STRAW (A) 05/05/2022 0031   APPEARANCEUR CLEAR 05/05/2022 0031   LABSPEC 1.006 05/05/2022 0031   PHURINE 6.0 05/05/2022 0031   GLUCOSEU NEGATIVE 05/05/2022 0031   HGBUR NEGATIVE 05/05/2022 0031   BILIRUBINUR NEGATIVE 05/05/2022 0031   BILIRUBINUR Negative 10/09/2018 1158   KETONESUR NEGATIVE 05/05/2022 0031   PROTEINUR NEGATIVE 05/05/2022 0031   UROBILINOGEN 0.2 10/09/2018 1158   NITRITE NEGATIVE 05/05/2022 0031  LEUKOCYTESUR NEGATIVE 05/05/2022 0031   Sepsis Labs Recent Labs  Lab 05/05/22 0334 05/06/22 0323 05/07/22 0352 05/08/22 0338  WBC 4.3 4.0 6.1 4.7   Microbiology Recent  Results (from the past 240 hour(s))  Blood culture (routine x 2)     Status: None (Preliminary result)   Collection Time: 05/04/22  9:21 PM   Specimen: Right Antecubital; Blood  Result Value Ref Range Status   Specimen Description   Final    RIGHT ANTECUBITAL Performed at [facility], [place] [address] [place], [place] [address]    Special Requests   Final    BOTTLES DRAWN AEROBIC AND ANAEROBIC Blood Culture adequate volume Performed at [facility] [address], [place], [place] [address]    Culture   Final    NO GROWTH 3 DAYS Performed at [facility], [place] [address], [place], [place] [address]    Report Status PENDING  Incomplete  Blood culture (routine x 2)     Status: None (Preliminary result)   Collection Time: 05/05/22  3:34 AM   Specimen: Left Antecubital; Blood  Result Value Ref Range Status   Specimen Description   Final    LEFT ANTECUBITAL BLOOD Performed at [facility], [place] [address], [place], [place] [address]    Special Requests   Final    BOTTLES DRAWN AEROBIC AND ANAEROBIC Blood Culture results may not be optimal due to an inadequate volume of blood received in culture bottles Performed at [facility] [address], [place], [place] [address]    Culture   Final    NO GROWTH 3 DAYS Performed at [facility], [place] [address], [place], [place] [address]    Report Status PENDING  Incomplete    Procedures/Studies: DG ERCP  Result Date: 05/08/2022 CLINICAL DATA:  87 year old female with a history elevated liver enzymes and biliary obstruction EXAM: ERCP TECHNIQUE: Multiple spot images obtained with the fluoroscopic device and submitted for interpretation post-procedure. FLUOROSCOPY: Radiation Exposure Index (as provided by the fluoroscopic device): 2 minutes 28 seconds, op note mGy Kerma COMPARISON:  MR 05/04/2022 FINDINGS: Limited images during ERCP. Initial image demonstrates endoscope  projecting over the upper abdomen. Surgical changes of cholecystectomy. Subsequently there is cannulation of the ampulla with placement of a safety wire and retrograde infusion of contrast. Partial opacification of the intrahepatic and extrahepatic biliary ducts. Mild dilation of the visualized ducts. Image 3 demonstrates geometric filling defect, compatible with choledocholithiasis present on prior MR. IMPRESSION: Limited images during ERCP during treatment of choledocholithiasis, with partial opacification of the biliary ducts as above. Please refer to the dictated operative report for full details of intraoperative findings and procedure. Electronically Signed   By: Corrie Mckusick D.O.   On: 05/08/2022 07:51   DG ERCP  Result Date: 05/07/2022 CLINICAL DATA:  87 year old female with cholelithiasis EXAM: ERCP TECHNIQUE: Multiple spot images obtained with the fluoroscopic device and submitted for interpretation post-procedure. FLUOROSCOPY: Radiation Exposure Index (as provided by the fluoroscopic device): Op note mGy Kerma COMPARISON:  MR 05/04/2022 FINDINGS: Limited intraoperative fluoroscopic spot images during ERCP. Initial image demonstrates endoscope projecting over the upper abdomen, with surgical changes of cholecystectomy. Subsequently there is retrograde infusion of contrast at the ampulla, with partial opacification of the pancreatic duct. IMPRESSION: Limited images during ERCP with partial opacification of the pancreatic duct. Please refer to the dictated operative report for full details of intraoperative findings and procedure. Electronically Signed   By:  Corrie Mckusick D.O.   On: 05/07/2022 08:02   MR ABDOMEN MRCP W WO CONTAST  Result Date: 05/04/2022 CLINICAL DATA:  Elevated transaminase level. Elevated alkaline phosphatase level. Common bile duct dilation on ultrasound. EXAM: MRI ABDOMEN WITHOUT AND WITH CONTRAST (INCLUDING MRCP) TECHNIQUE: Multiplanar multisequence MR imaging of the abdomen  was performed both before and after the administration of intravenous contrast. Heavily T2-weighted images of the biliary and pancreatic ducts were obtained, and three-dimensional MRCP images were rendered by post processing. CONTRAST:  60m GADAVIST GADOBUTROL 1 MMOL/ML IV SOLN COMPARISON:  Ultrasound May 03, 2022. FINDINGS: Lower chest: No acute abnormality. Hepatobiliary: No significant hepatic steatosis. Scattered hepatic cysts. Gallbladder surgically absent. Dilation of the intra and extrahepatic biliary tree with the common bile duct measuring 15 mm in maximum diameter and a 12 mm choledocholithiasis at the ampulla. Very mild peribiliary enhancement may be reactive or reflect ascending infection. Pancreas: Pancreatic divisum, anatomic variant. No pancreatic ductal dilation or evidence of acute inflammation. Spleen:  No splenomegaly or focal splenic lesion. Adrenals/Urinary Tract: Bilateral adrenal glands appear normal. No hydronephrosis. No solid enhancing renal mass. Stomach/Bowel: No pathologic dilation or evidence of acute inflammation involving loops of large or small bowel in the abdomen. Vascular/Lymphatic: No pathologically enlarged lymph nodes identified. No abdominal aortic aneurysm demonstrated. Other:  None. Musculoskeletal: No suspicious bone lesions identified. IMPRESSION: 1. Biliary obstruction with intra and extrahepatic biliary dilation secondary to a 12 mm choledocholithiasis at the ampulla. Additionally, there is very mild peribiliary enhancement which may be reactive or reflect ascending cholangitis, clinical and laboratory assessment suggested. 2. Cholecystectomy. These results will be called to the ordering clinician or representative by the Radiologist Assistant, and communication documented in the PACS or [employer] Electronically Signed   By: JDahlia BailiffM.D.   On: 05/04/2022 14:19   MR 3D Recon At Scanner  Result Date: 05/04/2022 CLINICAL DATA:  Elevated transaminase  level. Elevated alkaline phosphatase level. Common bile duct dilation on ultrasound. EXAM: MRI ABDOMEN WITHOUT AND WITH CONTRAST (INCLUDING MRCP) TECHNIQUE: Multiplanar multisequence MR imaging of the abdomen was performed both before and after the administration of intravenous contrast. Heavily T2-weighted images of the biliary and pancreatic ducts were obtained, and three-dimensional MRCP images were rendered by post processing. CONTRAST:  529mGADAVIST GADOBUTROL 1 MMOL/ML IV SOLN COMPARISON:  Ultrasound May 03, 2022. FINDINGS: Lower chest: No acute abnormality. Hepatobiliary: No significant hepatic steatosis. Scattered hepatic cysts. Gallbladder surgically absent. Dilation of the intra and extrahepatic biliary tree with the common bile duct measuring 15 mm in maximum diameter and a 12 mm choledocholithiasis at the ampulla. Very mild peribiliary enhancement may be reactive or reflect ascending infection. Pancreas: Pancreatic divisum, anatomic variant. No pancreatic ductal dilation or evidence of acute inflammation. Spleen:  No splenomegaly or focal splenic lesion. Adrenals/Urinary Tract: Bilateral adrenal glands appear normal. No hydronephrosis. No solid enhancing renal mass. Stomach/Bowel: No pathologic dilation or evidence of acute inflammation involving loops of large or small bowel in the abdomen. Vascular/Lymphatic: No pathologically enlarged lymph nodes identified. No abdominal aortic aneurysm demonstrated. Other:  None. Musculoskeletal: No suspicious bone lesions identified. IMPRESSION: 1. Biliary obstruction with intra and extrahepatic biliary dilation secondary to a 12 mm choledocholithiasis at the ampulla. Additionally, there is very mild peribiliary enhancement which may be reactive or reflect ascending cholangitis, clinical and laboratory assessment suggested. 2. Cholecystectomy. These results will be called to the ordering clinician or representative by the Radiologist Assistant, and  communication documented in the PACS or [employer]ically  Signed   By: Dahlia Bailiff M.D.   On: 05/04/2022 14:19   US Abdomen Limited RUQ (LIVER/GB)  Result Date: 05/03/2022 CLINICAL DATA:  Elevated LFTs EXAM: ULTRASOUND ABDOMEN LIMITED RIGHT UPPER QUADRANT COMPARISON:  None Available. FINDINGS: Gallbladder: Surgically absent Common bile duct: Diameter: Dilated measuring 12 mm Liver: Increased echogenicity. No focal lesion. Portal vein is patent on color Doppler imaging with normal direction of blood flow towards the liver. Other: None. IMPRESSION: Prior cholecystectomy. Common bile duct is dilated measuring 12 mm. In the setting of abnormal LFTs, recommend further evaluation with MRI/MRCP. Increased hepatic parenchymal echogenicity suggestive of steatosis. Electronically Signed   By: Lovey Newcomer M.D.   On: 05/03/2022 11:18     Time coordinating discharge: Over 30 minutes    Dwyane Dee, MD  [facility] 05/08/2022, 1:36 PM

## 2022-05-08 NOTE — Progress Notes (Signed)
Gastroenterology Inpatient Follow-up Note   PATIENT IDENTIFICATION  Meekah Math is a 87 y.o. female with a pmh significant for hyperlipidemia, glaucoma, colon polyps, status post cholecystectomy, choledocholithiasis (now status post repeat ERCP with stone extraction). Hospital Day: 5  SUBJECTIVE  The patient is hemodynamically stable. There is no progressive abdominal pain. Her laboratories have been reviewed with stable to slightly improved LFTs.   OBJECTIVE  Scheduled Inpatient Medications:   diclofenac  100 mg Rectal Once   diclofenac  100 mg Rectal Once   pantoprazole  40 mg Oral Daily   Continuous Inpatient Infusions:  PRN Inpatient Medications: ondansetron **OR** ondansetron (ZOFRAN) IV   Physical Examination  Temp:  [97.4 F (36.3 C)-98.3 F (36.8 C)] 97.7 F (36.5 C) (09/19 0616) Pulse Rate:  [50-59] 56 (09/19 0616) Resp:  [12-17] 17 (09/19 0616) BP: (130-163)/(66-80) 144/73 (09/19 0616) SpO2:  [96 %-100 %] 97 % (09/19 0616) Weight:  [54 kg] 54 kg (09/18 1413) Temp (24hrs), Avg:97.8 F (36.6 C), Min:97.4 F (36.3 C), Max:98.3 F (36.8 C)  Weight: 54 kg GEN: NAD, appears older than stated age, nontoxic PSYCH: Cooperative EYE: Conjunctivae pink, sclerae anicteric ENT: MMM  CV: Nontachycardic RESP: No audible wheezing GI: NABS, soft, NT, without rebound  MSK/EXT: No lower extremity edema SKIN: No jaundice at NEURO:  Alert & Oriented x 3, no focal deficits   Review of Data   Laboratory Studies   Recent Labs  Lab 05/08/22 0338  NA 138  K 4.3  CL 107  CO2 24  BUN 13  CREATININE 0.57  GLUCOSE 97  CALCIUM 9.1  MG 1.9   Recent Labs  Lab 05/08/22 0338  AST 74*  ALT 225*  ALKPHOS 390*    Recent Labs  Lab 05/06/22 0323 05/07/22 0352 05/08/22 0338  WBC 4.0 6.1 4.7  HGB 11.8* 11.8* 11.9*  HCT 35.8* 37.0 36.2  PLT 176 177 196   No results for input(s): "APTT", "INR" in the last 168 hours.  Imaging Studies  DG ERCP  Result Date:  05/08/2022 CLINICAL DATA:  87 year old female with a history elevated liver enzymes and biliary obstruction EXAM: ERCP TECHNIQUE: Multiple spot images obtained with the fluoroscopic device and submitted for interpretation post-procedure. FLUOROSCOPY: Radiation Exposure Index (as provided by the fluoroscopic device): 2 minutes 28 seconds, op note mGy Kerma COMPARISON:  MR 05/04/2022 FINDINGS: Limited images during ERCP. Initial image demonstrates endoscope projecting over the upper abdomen. Surgical changes of cholecystectomy. Subsequently there is cannulation of the ampulla with placement of a safety wire and retrograde infusion of contrast. Partial opacification of the intrahepatic and extrahepatic biliary ducts. Mild dilation of the visualized ducts. Image 3 demonstrates geometric filling defect, compatible with choledocholithiasis present on prior MR. IMPRESSION: Limited images during ERCP during treatment of choledocholithiasis, with partial opacification of the biliary ducts as above. Please refer to the dictated operative report for full details of intraoperative findings and procedure. Electronically Signed   By: Corrie Mckusick D.O.   On: 05/08/2022 07:51    GI Procedures and Studies  ERCP - The major papilla was on the rim of a diverticulum. - The major papilla appeared to be prominent. - Long positioning was best position initially before we were able to reduce the scope. - A filling defect consistent with a stone was seen on the cholangiogram. - The entire main bile duct was moderately dilated. - Choledocholithiasis was found. Complete removal was accomplished by biliary sphincterotomy and balloon trawl.   ASSESSMENT  Ms. [employer]  is a 87 y.o. female with a pmh significant for hyperlipidemia, glaucoma, colon polyps, status post cholecystectomy, choledocholithiasis (now status post repeat ERCP with stone extraction).  The patient is hemodynamically stable.  She seems to have done well post ERCP.   LFTs have down trended slightly but may take a few days to have further improvement if this is all related to the large choledocholithiasis that was removed yesterday.  Plan to repeat LFTs in 1 to 2 weeks and we will arrange that with her primary gastroenterologist.  From a GI standpoint there is no contraindication for the patient to remain hospitalized at this point.  We will work on arranging follow-up.   PLAN/RECOMMENDATIONS  Repeat LFTs in 1 to 2 weeks at [facility] office Follow-up in clinic to be arranged already has appointment scheduled later this week) Pathology pending at this time but will be reviewed with patient when that returns   Reasonable for discharge later today from GI standpoint.  Please page/call with questions or concerns.   Justice Britain, MD [facility] Office # [phone]    LOS: 3 days  Irving Copas  05/08/2022, 2:04 PM

## 2022-05-08 NOTE — Care Management Important Message (Signed)
Important Message  Patient Details IM Letter placed in Patients room. Name: Sheila Wood MRN: [id number] Date of Birth: 10-19-1934   Medicare Important Message Given:  Yes     Kerin Salen 05/08/2022, 11:49 AM

## 2022-05-08 NOTE — Assessment & Plan Note (Signed)
-   Biopsies obtained during ERCP -Protonix initiated per GI recommendations - GI will follow-up biopsy results

## 2022-05-08 NOTE — Anesthesia Postprocedure Evaluation (Signed)
Anesthesia Post Note  Patient: Sheila Wood  Procedure(s) Performed: ENDOSCOPIC RETROGRADE CHOLANGIOPANCREATOGRAPHY (ERCP) UPPER ENDOSCOPIC ULTRASOUND (EUS) LINEAR ESOPHAGOGASTRODUODENOSCOPY (EGD) BIOPSY SPHINCTEROTOMY REMOVAL OF STONES     Patient location during evaluation: Endoscopy Anesthesia Type: General Level of consciousness: awake and alert Pain management: pain level controlled Vital Signs Assessment: post-procedure vital signs reviewed and stable Respiratory status: spontaneous breathing, nonlabored ventilation and respiratory function stable Cardiovascular status: blood pressure returned to baseline and stable Postop Assessment: no apparent nausea or vomiting Anesthetic complications: no   No notable events documented.  Last Vitals:  Vitals:   05/08/22 0139 05/08/22 0616  BP: (!) 163/72 (!) 144/73  Pulse: (!) 53 (!) 56  Resp: 17 17  Temp: 36.6 C 36.5 C  SpO2: 98% 97%    Last Pain:  Vitals:   05/08/22 0616  TempSrc: Oral  PainSc: 0-No pain   Pain Goal:                   Lidia Collum

## 2022-05-08 NOTE — Plan of Care (Signed)

## 2022-05-09 LAB — SURGICAL PATHOLOGY

## 2022-05-10 LAB — CULTURE, BLOOD (ROUTINE X 2)
Culture: NO GROWTH
Culture: NO GROWTH
Special Requests: ADEQUATE

## 2022-05-11 ENCOUNTER — Encounter: Payer: Self-pay | Admitting: Gastroenterology

## 2022-05-11 ENCOUNTER — Other Ambulatory Visit (INDEPENDENT_AMBULATORY_CARE_PROVIDER_SITE_OTHER): Payer: Medicare Other

## 2022-05-11 ENCOUNTER — Encounter: Payer: Self-pay | Admitting: Physician Assistant

## 2022-05-11 ENCOUNTER — Ambulatory Visit: Payer: Medicare Other | Admitting: Physician Assistant

## 2022-05-11 VITALS — BP 100/78 | HR 70 | Ht 65.0 in | Wt 111.8 lb

## 2022-05-11 DIAGNOSIS — K31A0 Gastric intestinal metaplasia, unspecified: Secondary | ICD-10-CM

## 2022-05-11 DIAGNOSIS — R7989 Other specified abnormal findings of blood chemistry: Secondary | ICD-10-CM | POA: Diagnosis not present

## 2022-05-11 DIAGNOSIS — Z9889 Other specified postprocedural states: Secondary | ICD-10-CM | POA: Diagnosis not present

## 2022-05-11 LAB — COMPREHENSIVE METABOLIC PANEL
ALT: 82 U/L — ABNORMAL HIGH (ref 0–35)
AST: 16 U/L (ref 0–37)
Albumin: 3.9 g/dL (ref 3.5–5.2)
Alkaline Phosphatase: 325 U/L — ABNORMAL HIGH (ref 39–117)
BUN: 20 mg/dL (ref 6–23)
CO2: 27 mEq/L (ref 19–32)
Calcium: 9.7 mg/dL (ref 8.4–10.5)
Chloride: 103 mEq/L (ref 96–112)
Creatinine, Ser: 0.88 mg/dL (ref 0.40–1.20)
GFR: 59.8 mL/min — ABNORMAL LOW (ref >60.00)
Glucose, Bld: 105 mg/dL — ABNORMAL HIGH (ref 70–99)
Potassium: 3.8 mEq/L (ref 3.5–5.1)
Sodium: 138 mEq/L (ref 135–145)
Total Bilirubin: 0.5 mg/dL (ref 0.2–1.2)
Total Protein: 7 g/dL (ref 6.0–8.3)

## 2022-05-11 LAB — HEPATIC FUNCTION PANEL
ALT: 83 U/L — ABNORMAL HIGH (ref 0–35)
AST: 16 U/L (ref 0–37)
Albumin: 3.9 g/dL (ref 3.5–5.2)
Alkaline Phosphatase: 325 U/L — ABNORMAL HIGH (ref 39–117)
Bilirubin, Direct: 0.1 mg/dL (ref 0.0–0.3)
Total Bilirubin: 0.5 mg/dL (ref 0.2–1.2)
Total Protein: 7 g/dL (ref 6.0–8.3)

## 2022-05-11 NOTE — Progress Notes (Signed)
Chief Complaint: Follow-up from choledocholithiasis  HPI:    Sheila Wood is an 87 year old female with a past medical history as listed below, known to Dr. Carlean Purl, who presents to clinic today for follow-up after recent hospitalization for choledocholithiasis and ERCP.    05/06/2022 ERCP attempted by Dr. Henrene Pastor but was aborted due to distorted anatomy.  05/07/2022 ERCP and EUS with Dr. Rush Landmark.  Patient found to have a filling defect consistent with a stone seen on cholangiogram, the entire main bile duct was moderately dilated, there is complete removal accomplished by biliary sphincterotomy and balloon control.    Today, the patient tells me that when she got home the next day she had 2 episodes of vomiting and diarrhea and this lasted for about 24 to 48 hours she feels some better now but still has a loose movement after eating pretty much anything and nothing really sounds appetizing to her.      Denies fevers, chills or abdominal pain.  Past Medical History:  Diagnosis Date   Gallstones 05/05/2022   History of colonic polyps    Hyperlipidemia    Internal hemorrhoid    Osteoporosis    Shingles     Past Surgical History:  Procedure Laterality Date   BIOPSY  05/07/2022   Procedure: BIOPSY;  Surgeon: Rush Landmark Telford Nab., MD;  Location: [facility];  Service: Gastroenterology;;   CHOLECYSTECTOMY     at time of colectomy   COLONOSCOPY     ERCP N/A 05/06/2022   Procedure: ENDOSCOPIC RETROGRADE CHOLANGIOPANCREATOGRAPHY (ERCP);  Surgeon: Irene Shipper, MD;  Location: [facility];  Service: Gastroenterology;  Laterality: N/A;   ERCP N/A 05/07/2022   Procedure: ENDOSCOPIC RETROGRADE CHOLANGIOPANCREATOGRAPHY (ERCP);  Surgeon: Irving Copas., MD;  Location: [facility];  Service: Gastroenterology;  Laterality: N/A;   ESOPHAGOGASTRODUODENOSCOPY     ESOPHAGOGASTRODUODENOSCOPY N/A 05/07/2022   Procedure: ESOPHAGOGASTRODUODENOSCOPY (EGD);  Surgeon: Irving Copas., MD;   Location: [facility];  Service: Gastroenterology;  Laterality: N/A;   EUS N/A 05/07/2022   Procedure: UPPER ENDOSCOPIC ULTRASOUND (EUS) LINEAR;  Surgeon: Irving Copas., MD;  Location: [facility];  Service: Gastroenterology;  Laterality: N/A;   REMOVAL OF STONES  05/07/2022   Procedure: REMOVAL OF STONES;  Surgeon: Rush Landmark Telford Nab., MD;  Location: [facility];  Service: Gastroenterology;;   RIGHT COLECTOMY     adenomatous polyp   SPHINCTEROTOMY  05/07/2022   Procedure: Joan Mayans;  Surgeon: Mansouraty, Telford Nab., MD;  Location: [facility];  Service: Gastroenterology;;    Current Outpatient Medications  Medication Sig Dispense Refill   cholecalciferol (VITAMIN D3) 25 MCG (1000 UNIT) tablet Take 1,000 Units by mouth daily.     pantoprazole (PROTONIX) 40 MG tablet Take 1 tablet (40 mg total) by mouth daily. 30 tablet 3   No current facility-administered medications for this visit.    Allergies as of 05/11/2022 - Review Complete 05/11/2022  Allergen Reaction Noted   Cannabinoids Hives 11/21/2020   Penicillins Rash 06/24/2016   Aspirin  05/05/2007    Family History  Problem Relation Age of Onset   Colon cancer Brother    Heart disease Mother        unclear history   Diabetes Sister    Lung cancer Father    Kidney disease Son    Prostate cancer Brother     Social History   Socioeconomic History   Marital status: Married    Spouse name: Not on file   Number of children: 3   Years of education:  Not on file   Highest education level: Not on file  Occupational History   Occupation: Retired   Tobacco Use   Smoking status: Never   Smokeless tobacco: Never  Vaping Use   Vaping Use: Never used  Substance and Sexual Activity   Alcohol use: Not Currently    Alcohol/week: 2.0 standard drinks of alcohol    Types: 2 Standard drinks or equivalent per week   Drug use: No   Sexual activity: Not Currently  Other Topics Concern   Not on file  Social  History Narrative   Widowed in Oct 2014. 1 Son lives with her whom is on dialysis; 3 children (daughter, 2 sons) all live close-sees regularly. 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, banking previously, homemaker)      Hobbies: puzzles, church, go for walks      Still drives and handles finances    Social Determinants of Health   Financial Resource Strain: Low Risk  (10/27/2020)   Overall Financial Resource Strain (CARDIA)    Difficulty of Paying Living Expenses: Not hard at all  Food Insecurity: No Food Insecurity (05/04/2022)   Hunger Vital Sign    Worried About Running Out of Food in the Last Year: Never true    [place] in the Last Year: Never true  Transportation Needs: No Transportation Needs (05/04/2022)   PRAPARE - Hydrologist (Medical): No    Lack of Transportation (Non-Medical): No  Physical Activity: Inactive (10/27/2020)   Exercise Vital Sign    Days of Exercise per Week: 0 days    Minutes of Exercise per Session: 0 min  Stress: Stress Concern Present (10/27/2020)   Peculiar    Feeling of Stress : To some extent  Social Connections: Moderately Integrated (10/27/2020)   Social Connection and Isolation Panel [NHANES]    Frequency of Communication with Friends and Family: More than three times a week    Frequency of Social Gatherings with Friends and Family: Once a week    Attends Religious Services: More than 4 times per year    Active Member of [employer] or Organizations: No    Attends Archivist Meetings: Never    Marital Status: Married  Human resources officer Violence: Not At Risk (10/27/2020)   Humiliation, Afraid, Rape, and Kick questionnaire    Fear of Current or Ex-Partner: No    Emotionally Abused: No    Physically Abused: No    Sexually Abused: No    Review of Systems:     Constitutional: No weight loss, fever or chills Cardiovascular: No chest pain Respiratory: No SOB  Gastrointestinal: See HPI and otherwise negative   Physical Exam:  Vital signs: BP 100/78   Pulse 70   Ht '5\' 5"'$  (1.651 m)   Wt 111 lb 12.8 oz (50.7 kg)   LMP  (LMP Unknown)   SpO2 94%   BMI 18.60 kg/m    Constitutional:   Pleasant Elderly Caucasian female appears to be in NAD, Well developed, Well nourished, alert and cooperative Respiratory: Respirations even and unlabored. Lungs clear to auscultation bilaterally.   No wheezes, crackles, or rhonchi.  Cardiovascular: Normal S1, S2. No MRG. Regular rate and rhythm. No peripheral edema, cyanosis or pallor.  Gastrointestinal:  Soft, nondistended, nontender. No rebound or guarding. Normal bowel sounds. No appreciable masses or hepatomegaly. Rectal:  Not performed.  Psychiatric: Oriented to person, place and time. Demonstrates good judgement and reason without abnormal affect or behaviors.  RELEVANT LABS AND IMAGING: CBC    Component Value Date/Time   WBC 4.7 05/08/2022 0338   RBC 3.80 (L) 05/08/2022 0338   HGB 11.9 (L) 05/08/2022 0338   HCT 36.2 05/08/2022 0338   PLT 196 05/08/2022 0338   MCV 95.3 05/08/2022 0338   MCH 31.3 05/08/2022 0338   MCHC 32.9 05/08/2022 0338   RDW 13.8 05/08/2022 0338   LYMPHSABS 1.3 05/08/2022 0338   MONOABS 0.3 05/08/2022 0338   EOSABS 0.0 05/08/2022 0338   BASOSABS 0.0 05/08/2022 0338    CMP     Component Value Date/Time   NA 138 05/08/2022 0338   K 4.3 05/08/2022 0338   CL 107 05/08/2022 0338   CO2 24 05/08/2022 0338   GLUCOSE 97 05/08/2022 0338   BUN 13 05/08/2022 0338   CREATININE 0.57 05/08/2022 0338   CALCIUM 9.1 05/08/2022 0338   PROT 6.1 (L) 05/08/2022 0338   ALBUMIN 3.3 (L) 05/08/2022 0338   AST 74 (H) 05/08/2022 0338   ALT 225 (H) 05/08/2022 0338   ALKPHOS 390 (H) 05/08/2022 0338   BILITOT 0.7 05/08/2022 0338   GFRNONAA >60 05/08/2022 0338   GFRAA >60 12/03/2018 1657     Assessment: 1.  History of choledocholithiasis: Recent ERCP 05/07/2022 with sphincterotomy and balloon sweep, LFTs decreasing in the hospital, did go home and have another episode of vomiting and some diarrhea, but this is since abated; consider relation to recent choledocholithiasis versus viral gastroenteritis 2.  Elevated LFTs: With above  Plan: 1.  Recheck LFTs now.  Pending results will likely need to recheck in another couple of weeks. 2.  Discussed biopsy results showing gastric intestinal metaplasia.  Explained to the patient that in a couple months and she is feeling better she can discuss this more thoroughly with Dr. Carlean Purl and decide if repeat EGD with gastric mapping is beneficial to her.  Briefly discussed this today. 3.  Continue Pantoprazole 40 mg daily 4.  Patient to follow in clinic with Dr. Carlean Purl as above, his next available was December 1st  Ellouise Newer, Hershal Coria [facility] 05/11/2022, 2:55 PM  Cc: Marin Olp, MD

## 2022-05-11 NOTE — Progress Notes (Signed)
Attending Physician's Attestation   I have reviewed the chart.   I agree with the Advanced Practitioner's note, impression, and recommendations with any updates as below. We'll see how the LFT pattern looks, though we know it can take up to a couple of weeks for normalization post-sphincterotomy.  Agree that when better, further discussions about GIM surveillance is reasonable with her primary GI.   Justice Britain, MD [facility] Advanced [facility]oscopy Office # [phone]

## 2022-05-11 NOTE — Patient Instructions (Addendum)
_______________________________________________________  If you are age 85 or older, your body mass index should be between 23-30. Your Body mass index is 18.6 kg/m. If this is out of the aforementioned range listed, please consider follow up with your Primary Care Provider.  If you are age 61 or younger, your body mass index should be between 19-25. Your Body mass index is 18.6 kg/m. If this is out of the aformentioned range listed, please consider follow up with your Primary Care Provider.   ________________________________________________________  The [facility] providers would like to encourage you to use [facility] to communicate with providers for non-urgent requests or questions.  Due to long hold times on the telephone, sending your provider a message by [facility] may be a faster and more efficient way to get a response.  Please allow 48 business hours for a response.  Please remember that this is for non-urgent requests.  _______________________________________________________  Your provider has requested that you go to the basement level for lab work before leaving today. Press "B" on the elevator. The lab is located at the first door on the left as you exit the elevator.  Due to recent changes in healthcare laws, you may see the results of your imaging and laboratory studies on MyChart before your provider has had a chance to review them.  We understand that in some cases there may be results that are confusing or concerning to you. Not all laboratory results come back in the same time frame and the provider may be waiting for multiple results in order to interpret others.  Please give [place] 48 hours in order for your provider to thoroughly review all the results before contacting the office for clarification of your results.   Keep your appointment with Dr Carlean Purl   It was a pleasure to see you today!  Thank you for trusting me with your gastrointestinal care!

## 2022-05-15 ENCOUNTER — Ambulatory Visit: Payer: Medicare Other | Admitting: Physical Medicine and Rehabilitation

## 2022-05-16 ENCOUNTER — Ambulatory Visit (INDEPENDENT_AMBULATORY_CARE_PROVIDER_SITE_OTHER): Payer: Medicare Other | Admitting: Family Medicine

## 2022-05-16 ENCOUNTER — Encounter: Payer: Self-pay | Admitting: Family Medicine

## 2022-05-16 VITALS — BP 122/72 | HR 55 | Ht 65.0 in | Wt 111.0 lb

## 2022-05-16 DIAGNOSIS — R7989 Other specified abnormal findings of blood chemistry: Secondary | ICD-10-CM

## 2022-05-16 DIAGNOSIS — K3189 Other diseases of stomach and duodenum: Secondary | ICD-10-CM

## 2022-05-16 DIAGNOSIS — E785 Hyperlipidemia, unspecified: Secondary | ICD-10-CM

## 2022-05-16 DIAGNOSIS — E559 Vitamin D deficiency, unspecified: Secondary | ICD-10-CM

## 2022-05-16 DIAGNOSIS — Z9889 Other specified postprocedural states: Secondary | ICD-10-CM

## 2022-05-16 DIAGNOSIS — M25511 Pain in right shoulder: Secondary | ICD-10-CM

## 2022-05-16 NOTE — Patient Instructions (Addendum)
Schedule labs in 3-4 weeks to recheck the liver function  Schedule PT at the desk before you leave  Recommended follow up: Return for as needed for new, worsening, persistent symptoms. -definitely make sure to have an annual physical scheduled for next year as well

## 2022-05-16 NOTE — Progress Notes (Signed)
Phone [phone] In person visit   Subjective:   Sheila Wood is a 87 y.o. year old very pleasant female patient who presents for/with See problem oriented charting Chief Complaint  Patient presents with   Follow-up    Pt is here to f/u from hosp visit for choledocholithiasis.    Past Medical History-  Patient Active Problem List   Diagnosis Date Noted   Vitamin D deficiency 05/16/2022    Priority: Medium    Erosive gastropathy 05/08/2022    Priority: Medium    Abnormal LFTs     Priority: Medium    History of ERCP     Priority: Medium    Post herpetic neuralgia 12/24/2019    Priority: Medium    Hyperlipidemia 05/05/2007    Priority: Medium    Osteoporosis 05/05/2007    Priority: Medium    Glaucoma 03/12/2016    Priority: Low   History of colonic polyps 05/05/2007    Priority: Low   Dilated bile duct    Choledocholithiasis with obstruction 05/04/2022   Sciatica of right side 06/29/2021    Medications- reviewed and updated Current Outpatient Medications  Medication Sig Dispense Refill   cholecalciferol (VITAMIN D3) 25 MCG (1000 UNIT) tablet Take 1,000 Units by mouth daily.     pantoprazole (PROTONIX) 40 MG tablet Take 1 tablet (40 mg total) by mouth daily. 30 tablet 3   No current facility-administered medications for this visit.     Objective:  BP 122/72   Pulse (!) 55   Ht '5\' 5"'$  (1.651 m)   Wt 111 lb (50.3 kg)   LMP  (LMP Unknown)   SpO2 99%   BMI 18.47 kg/m  Gen: NAD, resting comfortably CV: RRR no murmurs rubs or gallops Lungs: CTAB no crackles, wheeze, rhonchi-not bradycardic on my exam Abdomen: soft/nontender/nondistended/normal bowel sounds. No rebound or guarding.  Ext: no edema Skin: warm, dry  Right shoulder- Patient limited to about 30 degrees abduction due to pain-not able to get a full range of motion examination due to her pain.  Positive Neer, Hawkin, empty can test. Left shoulder normal    Assessment and Plan   #Hospital follow-up  for choledocholithiasis S: Patient was hospitalized from 9/15 to 05/08/2022-she was sent to the hospital after found to have obstructive choledocholithiasis with elevated LFTs-based on outpatient MRCP.  No fever/abdominal pain or leukocytosis so cholangitis thought unlikely and antibiotics not started.  She had an ERCP but unable to cannulate CBD and required repeat ERCP the following day and 14 mm stone was able to be removed.  She did have several episodes of vomiting and some diarrhea at home (sounds like gastroenteritis from Wednesday night to last Sunday) before GI visit but thankfully no recurrent issues Lab Results  Component Value Date   ALT 82 (H) 05/11/2022   AST 16 05/11/2022   ALKPHOS 325 (H) 05/11/2022   BILITOT 0.5 05/11/2022  A/P: Thankfully LFTs have already started to trend down significantly from peak.  She saw GI on 05/11/2022 and plan was to repeat LFTs in a few weeks per notes but I do not see this set up- I will go ahead and order these and have her come back in 3-4 weeks.  -also had very mild anemia- update CBC when she comes back but could be due to lab draws -had some weight loss from GI illness- appetite improving and improving intake -down 6 lbs on our scale  #Erosive gastropathy S: Noted during ERCP and Protonix was started.  Did have some biopsies which ended up showing gastric intestinal metaplasia-some consideration of repeat EGD-has follow-up with Dr. Arelia Longest December 1.  She understands this is precancerous -she stopped the protonix because vomiting started afte rthat A/P: for erosive gastropathy and gastric intestinal metaplasia- I think restarting the protonix is reasonable if does not have recurrent vomiting/diarrhea issues (doubt she will sounds like gi illness- daughter was also sick)   -encouraged following up with Dr. Carlean Purl as planned  - with potential for needing EGD repeat   #Vitamin D deficiency S: Medication: 1000 units otc she thinks Lab Results   Component Value Date   VD25OH 14.32 (L) 12/24/2019   A/P: hopefully improved- update vitamin D when she comes back for labs. Continue current meds for now    # right shoulder pain S:this started while in hospital felt like she laid on area wrong- was doing better then worsened with carrying something heavy- hard to comb hair/get arm overhead or around back  A/P: Concern for developing frozen shoulder-refer to PT-asked her to try to get in quickly.  Offered sports medicine referral but we will start with physical therapy and refer if they think needed  #hyperlipidemia S: Medication:none  Lab Results  Component Value Date   CHOL 220 (H) 02/27/2022   HDL 80.60 02/27/2022   LDLCALC 112 (H) 02/27/2022   TRIG 136.0 02/27/2022   CHOLHDL 3 02/27/2022   A/P: Only mild elevations in past age I would traditionally start medication for primary prevention plus has elevated LFTs after recent choledocholithiasis-remain off medication for now unless has a secondary indication  Recommended follow up: Return for as needed for new, worsening, persistent symptoms. Definitely need annual physical Future Appointments  Date Time Provider [place]  07/20/2022  2:10 PM Gatha Mayer, MD [facility]    Lab/Order associations:   ICD-10-CM   1. Abnormal LFTs  R79.89 Comprehensive metabolic panel    2. Hyperlipidemia, unspecified hyperlipidemia type  E78.5 CBC    3. Vitamin D deficiency  E55.9 Vitamin D (25 hydroxy)    4. History of ERCP  Z98.890     5. Acute pain of right shoulder  M25.511 Ambulatory referral to Physical Therapy      No orders of the defined types were placed in this encounter.   Return precautions advised.  Garret Reddish, MD

## 2022-05-17 ENCOUNTER — Telehealth: Payer: Self-pay

## 2022-05-17 NOTE — Patient Outreach (Signed)
  Care Coordination [facility] Note Transition Care Management Follow-up Telephone Call Date of discharge and from where: 05/08/22-[place] [facility] How have you been since you were released from the hospital? Patient reports she is doing better. She shares that she had stomach bug for three days but sxs have since resolved. She is dealing with some rotator cuff issues. Saw PCP yesterday and he has referred her to physical therapy. She reports she has not had to take anything for pain.  Any questions or concerns? No  Items Reviewed: Did the pt receive and understand the discharge instructions provided? Yes  Medications obtained and verified? Yes  Other? No  Any new allergies since your discharge? No  Dietary orders reviewed? Yes-low salt/heart healthy Do you have support at home? Yes   Home Care and Equipment/Supplies: Were home health services ordered? no If so, what is the name of the agency? N/A  Has the agency set up a time to come to the patient's home? N/A Were any new equipment or medical supplies ordered?  No What is the name of the medical supply agency? N/A Were you able to get the supplies/equipment? not applicable Do you have any questions related to the use of the equipment or supplies? No  Functional Questionnaire: (I = Independent and D = Dependent) ADLs: I  Bathing/Dressing- I  Meal Prep- I  Eating- I  Maintaining continence- I  Transferring/Ambulation- I  Managing Meds- I  Follow up appointments reviewed:  PCP Hospital f/u appt confirmed? Yes  Completed appt with  Dr. Yong Channel on 05/16/22. [facility] f/u appt confirmed? Yes  Completed-saw-GI MD on 05/11/22. Are transportation arrangements needed? No  If their condition worsens, is the pt aware to call PCP or go to the Emergency Dept.? Yes Was the patient provided with contact information for the PCP's office or ED? Yes Was to pt encouraged to call back with questions or concerns? Yes  SDOH assessments  and interventions completed:   Yes  Care Coordination Interventions Activated:  No   Care Coordination Interventions:  No Care Coordination interventions needed at this time.   Encounter Outcome:  Pt. Visit Completed    Enzo Montgomery, RN,BSN,CCM [place] [facility] Telephonic Care Management Coordinator Direct Phone: [phone] Toll Free: [phone] Fax: [phone]

## 2022-05-18 ENCOUNTER — Other Ambulatory Visit: Payer: Self-pay

## 2022-05-18 DIAGNOSIS — R7989 Other specified abnormal findings of blood chemistry: Secondary | ICD-10-CM

## 2022-05-18 DIAGNOSIS — Z9889 Other specified postprocedural states: Secondary | ICD-10-CM

## 2022-05-18 DIAGNOSIS — K31A0 Gastric intestinal metaplasia, unspecified: Secondary | ICD-10-CM

## 2022-05-24 ENCOUNTER — Ambulatory Visit: Payer: Medicare Other | Admitting: Physical Therapy

## 2022-06-07 ENCOUNTER — Other Ambulatory Visit (INDEPENDENT_AMBULATORY_CARE_PROVIDER_SITE_OTHER): Payer: Medicare Other

## 2022-06-07 ENCOUNTER — Other Ambulatory Visit: Payer: Self-pay | Admitting: Family Medicine

## 2022-06-07 DIAGNOSIS — K31A0 Gastric intestinal metaplasia, unspecified: Secondary | ICD-10-CM | POA: Diagnosis not present

## 2022-06-07 DIAGNOSIS — E785 Hyperlipidemia, unspecified: Secondary | ICD-10-CM

## 2022-06-07 DIAGNOSIS — Z9889 Other specified postprocedural states: Secondary | ICD-10-CM | POA: Diagnosis not present

## 2022-06-07 DIAGNOSIS — E559 Vitamin D deficiency, unspecified: Secondary | ICD-10-CM

## 2022-06-07 DIAGNOSIS — R7989 Other specified abnormal findings of blood chemistry: Secondary | ICD-10-CM

## 2022-06-07 LAB — COMPREHENSIVE METABOLIC PANEL
ALT: 10 U/L (ref 0–35)
AST: 14 U/L (ref 0–37)
Albumin: 4 g/dL (ref 3.5–5.2)
Alkaline Phosphatase: 123 U/L — ABNORMAL HIGH (ref 39–117)
BUN: 15 mg/dL (ref 6–23)
CO2: 28 mEq/L (ref 19–32)
Calcium: 9.4 mg/dL (ref 8.4–10.5)
Chloride: 103 mEq/L (ref 96–112)
Creatinine, Ser: 0.77 mg/dL (ref 0.40–1.20)
GFR: 70.16 mL/min (ref >60.00)
Glucose, Bld: 95 mg/dL (ref 70–99)
Potassium: 4.3 mEq/L (ref 3.5–5.1)
Sodium: 139 mEq/L (ref 135–145)
Total Bilirubin: 0.5 mg/dL (ref 0.2–1.2)
Total Protein: 6.4 g/dL (ref 6.0–8.3)

## 2022-06-07 LAB — CBC
HCT: 39.9 % (ref 36.0–46.0)
Hemoglobin: 13 g/dL (ref 12.0–15.0)
MCHC: 32.7 g/dL (ref 30.0–36.0)
MCV: 93 fl (ref 78.0–100.0)
Platelets: 196 10*3/uL (ref 150.0–400.0)
RBC: 4.28 Mil/uL (ref 3.87–5.11)
RDW: 13.8 % (ref 11.5–15.5)
WBC: 4 10*3/uL (ref 4.0–10.5)

## 2022-06-07 LAB — HEPATIC FUNCTION PANEL
ALT: 10 U/L (ref 0–35)
AST: 14 U/L (ref 0–37)
Albumin: 4 g/dL (ref 3.5–5.2)
Alkaline Phosphatase: 123 U/L — ABNORMAL HIGH (ref 39–117)
Bilirubin, Direct: 0.1 mg/dL (ref 0.0–0.3)
Total Bilirubin: 0.5 mg/dL (ref 0.2–1.2)
Total Protein: 6.4 g/dL (ref 6.0–8.3)

## 2022-06-07 LAB — VITAMIN D 25 HYDROXY (VIT D DEFICIENCY, FRACTURES): VITD: 20.11 ng/mL — ABNORMAL LOW (ref 30.00–100.00)

## 2022-06-07 MED ORDER — VITAMIN D (ERGOCALCIFEROL) 1.25 MG (50000 UNIT) PO CAPS: 50000.0000 [IU] | ORAL_CAPSULE | ORAL | 1 refills | Status: DC

## 2022-07-17 ENCOUNTER — Telehealth: Payer: Self-pay

## 2022-07-17 DIAGNOSIS — R7989 Other specified abnormal findings of blood chemistry: Secondary | ICD-10-CM

## 2022-07-17 DIAGNOSIS — K31A0 Gastric intestinal metaplasia, unspecified: Secondary | ICD-10-CM

## 2022-07-17 DIAGNOSIS — Z9889 Other specified postprocedural states: Secondary | ICD-10-CM

## 2022-07-17 NOTE — Telephone Encounter (Signed)
-----   Message from Yevette Edwards, RN sent at 05/18/2022 10:20 AM EDT ----- Regarding: Labs before f/u Hepatic function panel - order in epic

## 2022-07-17 NOTE — Telephone Encounter (Signed)
Spoke with patient to remind her that she is due for repeat labs at this time. No appointment is necessary. Patient is aware that she can stop by the lab in the basement at her convenience between 7:30 AM - 5 PM this Thursday before her appt. Patient verbalized understanding and had no concerns at the end of the call.

## 2022-07-19 ENCOUNTER — Other Ambulatory Visit: Payer: Medicare Other

## 2022-07-19 DIAGNOSIS — K31A0 Gastric intestinal metaplasia, unspecified: Secondary | ICD-10-CM

## 2022-07-19 DIAGNOSIS — Z9889 Other specified postprocedural states: Secondary | ICD-10-CM

## 2022-07-19 DIAGNOSIS — R7989 Other specified abnormal findings of blood chemistry: Secondary | ICD-10-CM

## 2022-07-19 LAB — HEPATIC FUNCTION PANEL
ALT: 11 U/L (ref 0–35)
AST: 15 U/L (ref 0–37)
Albumin: 4.2 g/dL (ref 3.5–5.2)
Alkaline Phosphatase: 104 U/L (ref 39–117)
Bilirubin, Direct: 0.1 mg/dL (ref 0.0–0.3)
Total Bilirubin: 0.5 mg/dL (ref 0.2–1.2)
Total Protein: 6.9 g/dL (ref 6.0–8.3)

## 2022-07-19 NOTE — Addendum Note (Signed)
Addended by: Yevette Edwards on: 07/19/2022 09:30 AM   Modules accepted: Orders

## 2022-07-20 ENCOUNTER — Ambulatory Visit (INDEPENDENT_AMBULATORY_CARE_PROVIDER_SITE_OTHER): Payer: Medicare Other | Admitting: Internal Medicine

## 2022-07-20 ENCOUNTER — Encounter: Payer: Self-pay | Admitting: Internal Medicine

## 2022-07-20 VITALS — BP 110/68 | HR 68 | Resp 93 | Ht 65.0 in | Wt 110.4 lb

## 2022-07-20 DIAGNOSIS — K8051 Calculus of bile duct without cholangitis or cholecystitis with obstruction: Secondary | ICD-10-CM

## 2022-07-20 DIAGNOSIS — K31A0 Gastric intestinal metaplasia, unspecified: Secondary | ICD-10-CM

## 2022-07-20 NOTE — Progress Notes (Signed)
Sheila Wood 87 y.o. November 04, 1934 [id number]  Assessment & Plan:   Encounter Diagnoses  Name Primary?   Calculus of bile duct without cholecystitis with obstruction - resolved Yes   Intestinal metaplasia of gastric mucosa     Her problems have resolved.  Follow-up as needed with respect to choledocholithiasis.  I reviewed the signs of obstructive jaundice again and she will look out for those.  I discussed that technically she has a precancerous condition with the gastric intestinal metaplasia.  I do not recommend a routine gastric mapping at her age and with these findings.  She is in agreement with this.  She is not inclined to have any more procedures unless absolutely necessary.  I appreciate the opportunity to care for this patient.  CC: Marin Olp, MD  Subjective:   Chief Complaint: Follow-up of choledocholithiasis abnormal LFTs and gastric intestinal metaplasia  HPI 87 year old white woman returns for follow-up of abnormal LFTs after choledocholithiasis treatment and also was found to have some gastric intestinal metaplasia at the time of her EUS ERCP in September.  She feels completely well at this time.  She lost a few pounds with that illness and has not regained but is still active and no abdominal pain and no difficulty with eating. When seen in September she had a slightly elevated alkaline phosphatase she had labs repeated today and they are normal.  I.e. LFTs all normal.   She says she remains quite busy she takes her son to dialysis and he lives with her and needs help going to doctors appointments. Wt Readings from Last 3 Encounters:  07/20/22 110 lb 6.4 oz (50.1 kg)  05/16/22 111 lb (50.3 kg)  05/11/22 111 lb 12.8 oz (50.7 kg)    Recent Labs  Lab 07/19/22 0932  AST 15  ALT 11  ALKPHOS 104  BILITOT 0.5  PROT 6.9  ALBUMIN 4.2    Allergies  Allergen Reactions   Cannabinoids Hives   Penicillins Rash    Did it involve swelling of the  face/tongue/throat, SOB, or low BP? No Did it involve sudden or severe rash/hives, skin peeling, or any reaction on the inside of your mouth or nose? Yes Did you need to seek medical attention at a hospital or doctor's office? No When did it last happen?  Several Years Ago     If all above answers are "NO", may proceed with cephalosporin use.     Aspirin    No outpatient medications have been marked as taking for the 07/20/22 encounter (Office Visit) with Gatha Mayer, MD.   Past Medical History:  Diagnosis Date   Gallstones 05/05/2022   History of colonic polyps    Hyperlipidemia    Internal hemorrhoid    Osteoporosis    Shingles    Past Surgical History:  Procedure Laterality Date   BIOPSY  05/07/2022   Procedure: BIOPSY;  Surgeon: Irving Copas., MD;  Location: [facility];  Service: Gastroenterology;;   CHOLECYSTECTOMY     at time of colectomy   COLONOSCOPY     ERCP N/A 05/06/2022   Procedure: ENDOSCOPIC RETROGRADE CHOLANGIOPANCREATOGRAPHY (ERCP);  Surgeon: Irene Shipper, MD;  Location: [facility];  Service: Gastroenterology;  Laterality: N/A;   ERCP N/A 05/07/2022   Procedure: ENDOSCOPIC RETROGRADE CHOLANGIOPANCREATOGRAPHY (ERCP);  Surgeon: Irving Copas., MD;  Location: [facility];  Service: Gastroenterology;  Laterality: N/A;   ESOPHAGOGASTRODUODENOSCOPY     ESOPHAGOGASTRODUODENOSCOPY N/A 05/07/2022   Procedure: ESOPHAGOGASTRODUODENOSCOPY (EGD);  Surgeon: Justice Britain  Brooke Bonito., MD;  Location: [facility];  Service: Gastroenterology;  Laterality: N/A;   EUS N/A 05/07/2022   Procedure: UPPER ENDOSCOPIC ULTRASOUND (EUS) LINEAR;  Surgeon: Irving Copas., MD;  Location: [facility];  Service: Gastroenterology;  Laterality: N/A;   REMOVAL OF STONES  05/07/2022   Procedure: REMOVAL OF STONES;  Surgeon: Mansouraty, Telford Nab., MD;  Location: [facility];  Service: Gastroenterology;;   RIGHT COLECTOMY     adenomatous polyp   SPHINCTEROTOMY   05/07/2022   Procedure: Joan Mayans;  Surgeon: Mansouraty, Telford Nab., MD;  Location: [facility];  Service: Gastroenterology;;   Social History   Social History Narrative   Widowed in Oct 2014. 1 Son lives with her whom is on dialysis; 3 children (daughter, 2 sons) all live close-sees regularly. 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   Review of Systems As above  Objective:   Physical Exam BP 110/68   Pulse 68   Resp (!) 93   Ht '5\' 5"'$  (1.651 m)   Wt 110 lb 6.4 oz (50.1 kg)   LMP  (LMP Unknown)   BMI 18.37 kg/m

## 2022-07-20 NOTE — Patient Instructions (Signed)
Great to see you today.  Due to recent changes in healthcare laws, you may see the results of your imaging and laboratory studies on MyChart before your provider has had a chance to review them.  We understand that in some cases there may be results that are confusing or concerning to you. Not all laboratory results come back in the same time frame and the provider may be waiting for multiple results in order to interpret others.  Please give [place] 48 hours in order for your provider to thoroughly review all the results before contacting the office for clarification of your results.    I appreciate the opportunity to care for you. Sheila Wood , MD

## 2023-03-05 ENCOUNTER — Ambulatory Visit (INDEPENDENT_AMBULATORY_CARE_PROVIDER_SITE_OTHER): Payer: Medicare Other | Admitting: Family Medicine

## 2023-03-05 ENCOUNTER — Encounter: Payer: Self-pay | Admitting: Family Medicine

## 2023-03-05 VITALS — BP 132/64 | HR 60 | Temp 97.6°F | Ht 65.0 in | Wt 111.8 lb

## 2023-03-05 DIAGNOSIS — E785 Hyperlipidemia, unspecified: Secondary | ICD-10-CM | POA: Diagnosis not present

## 2023-03-05 DIAGNOSIS — E559 Vitamin D deficiency, unspecified: Secondary | ICD-10-CM

## 2023-03-05 DIAGNOSIS — M810 Age-related osteoporosis without current pathological fracture: Secondary | ICD-10-CM | POA: Diagnosis not present

## 2023-03-05 DIAGNOSIS — B0229 Other postherpetic nervous system involvement: Secondary | ICD-10-CM

## 2023-03-05 DIAGNOSIS — Z0000 Encounter for general adult medical examination without abnormal findings: Secondary | ICD-10-CM | POA: Diagnosis not present

## 2023-03-05 LAB — CBC WITH DIFFERENTIAL/PLATELET
Basophils Absolute: 0 10*3/uL (ref 0.0–0.1)
Basophils Relative: 0.7 % (ref 0.0–3.0)
Eosinophils Absolute: 0.1 10*3/uL (ref 0.0–0.7)
Eosinophils Relative: 1.2 % (ref 0.0–5.0)
HCT: 40.8 % (ref 36.0–46.0)
Hemoglobin: 13.3 g/dL (ref 12.0–15.0)
Lymphocytes Relative: 28.7 % (ref 12.0–46.0)
Lymphs Abs: 1.5 10*3/uL (ref 0.7–4.0)
MCHC: 32.5 g/dL (ref 30.0–36.0)
MCV: 94.3 fl (ref 78.0–100.0)
Monocytes Absolute: 0.5 10*3/uL (ref 0.1–1.0)
Monocytes Relative: 9 % (ref 3.0–12.0)
Neutro Abs: 3.1 10*3/uL (ref 1.4–7.7)
Neutrophils Relative %: 60.4 % (ref 43.0–77.0)
Platelets: 209 10*3/uL (ref 150.0–400.0)
RBC: 4.32 Mil/uL (ref 3.87–5.11)
RDW: 13.6 % (ref 11.5–15.5)
WBC: 5.2 10*3/uL (ref 4.0–10.5)

## 2023-03-05 LAB — COMPREHENSIVE METABOLIC PANEL
ALT: 14 U/L (ref 0–35)
AST: 20 U/L (ref 0–37)
Albumin: 4.2 g/dL (ref 3.5–5.2)
Alkaline Phosphatase: 114 U/L (ref 39–117)
BUN: 14 mg/dL (ref 6–23)
CO2: 28 mEq/L (ref 19–32)
Calcium: 9.7 mg/dL (ref 8.4–10.5)
Chloride: 105 mEq/L (ref 96–112)
Creatinine, Ser: 0.71 mg/dL (ref 0.40–1.20)
GFR: 76.93 mL/min (ref >60.00)
Glucose, Bld: 83 mg/dL (ref 70–99)
Potassium: 4.7 mEq/L (ref 3.5–5.1)
Sodium: 139 mEq/L (ref 135–145)
Total Bilirubin: 0.5 mg/dL (ref 0.2–1.2)
Total Protein: 6.9 g/dL (ref 6.0–8.3)

## 2023-03-05 LAB — LIPID PANEL
Cholesterol: 225 mg/dL — ABNORMAL HIGH (ref 0–200)
HDL: 78.3 mg/dL (ref >39.00)
LDL Cholesterol: 123 mg/dL — ABNORMAL HIGH (ref 0–99)
NonHDL: 146.8
Total CHOL/HDL Ratio: 3
Triglycerides: 119 mg/dL (ref 0.0–149.0)
VLDL: 23.8 mg/dL (ref 0.0–40.0)

## 2023-03-05 LAB — VITAMIN D 25 HYDROXY (VIT D DEFICIENCY, FRACTURES): VITD: 21.33 ng/mL — ABNORMAL LOW (ref 30.00–100.00)

## 2023-03-05 NOTE — Progress Notes (Signed)
Phone [phone]   Subjective:  Patient presents today for their annual physical. Chief complaint-noted.   See problem oriented charting- ROS- full  review of systems was completed and negative Per full ROS sheet completed by patient- no change in baseline symptoms per patient- ongoing postherpetic neuralgia since 2021  The following were reviewed and entered/updated in epic: Past Medical History:  Diagnosis Date   Gallstones 05/05/2022   History of colonic polyps    Hyperlipidemia    Internal hemorrhoid    Osteoporosis    Shingles    Patient Active Problem List   Diagnosis Date Noted   Vitamin D deficiency 05/16/2022    Priority: Medium    Erosive gastropathy 05/08/2022    Priority: Medium    Abnormal LFTs     Priority: Medium    History of ERCP     Priority: Medium    Post herpetic neuralgia 12/24/2019    Priority: Medium    Hyperlipidemia 05/05/2007    Priority: Medium    Osteoporosis 05/05/2007    Priority: Medium    Glaucoma 03/12/2016    Priority: Low   History of colonic polyps 05/05/2007    Priority: Low   Dilated bile duct    Choledocholithiasis with obstruction 05/04/2022   Sciatica of right side 06/29/2021   Past Surgical History:  Procedure Laterality Date   BIOPSY  05/07/2022   Procedure: BIOPSY;  Surgeon: Lemar Lofty., MD;  Location: [facility];  Service: Gastroenterology;;   CHOLECYSTECTOMY     at time of colectomy   COLONOSCOPY     ERCP N/A 05/06/2022   Procedure: ENDOSCOPIC RETROGRADE CHOLANGIOPANCREATOGRAPHY (ERCP);  Surgeon: Hilarie Fredrickson, MD;  Location: [facility];  Service: Gastroenterology;  Laterality: N/A;   ERCP N/A 05/07/2022   Procedure: ENDOSCOPIC RETROGRADE CHOLANGIOPANCREATOGRAPHY (ERCP);  Surgeon: Lemar Lofty., MD;  Location: [facility];  Service: Gastroenterology;  Laterality: N/A;   ESOPHAGOGASTRODUODENOSCOPY     ESOPHAGOGASTRODUODENOSCOPY N/A 05/07/2022   Procedure: ESOPHAGOGASTRODUODENOSCOPY (EGD);   Surgeon: Lemar Lofty., MD;  Location: [facility];  Service: Gastroenterology;  Laterality: N/A;   EUS N/A 05/07/2022   Procedure: UPPER ENDOSCOPIC ULTRASOUND (EUS) LINEAR;  Surgeon: Lemar Lofty., MD;  Location: [facility];  Service: Gastroenterology;  Laterality: N/A;   REMOVAL OF STONES  05/07/2022   Procedure: REMOVAL OF STONES;  Surgeon: Mansouraty, Netty Starring., MD;  Location: [facility];  Service: Gastroenterology;;   RIGHT COLECTOMY     adenomatous polyp   SPHINCTEROTOMY  05/07/2022   Procedure: Dennison Mascot;  Surgeon: Mansouraty, Netty Starring., MD;  Location: [facility];  Service: Gastroenterology;;    Family History  Problem Relation Age of Onset   Colon cancer Brother    Heart disease Mother        unclear history   Diabetes Sister    Lung cancer Father    Kidney disease Son    Prostate cancer Brother     Medications- reviewed and updated Current Outpatient Medications  Medication Sig Dispense Refill   cholecalciferol (VITAMIN D3) 25 MCG (1000 UNIT) tablet Take 1,000 Units by mouth daily.     pantoprazole (PROTONIX) 40 MG tablet Take 1 tablet (40 mg total) by mouth daily. 30 tablet 3   Vitamin D, Ergocalciferol, (DRISDOL) 1.25 MG (50000 UNIT) CAPS capsule Take 1 capsule (50,000 Units total) by mouth every 7 (seven) days. 13 capsule 1   No current facility-administered medications for this visit.    Allergies-reviewed and updated Allergies  Allergen Reactions   Cannabinoids Hives  Penicillins Rash    Did it involve swelling of the face/tongue/throat, SOB, or low BP? No Did it involve sudden or severe rash/hives, skin peeling, or any reaction on the inside of your mouth or nose? Yes Did you need to seek medical attention at a hospital or doctor's office? No When did it last happen?  Several Years Ago     If all above answers are "NO", may proceed with cephalosporin use.     Aspirin     Social History   Social History Narrative    Widowed in Oct 2014. 1 Son lives with her whom is on dialysis; 3 children (daughter, 2 sons) all live close-sees regularly. 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 Oropharynx normal Neck: no thyromegaly CV: RRR no murmurs rubs or gallops Lungs: CTAB no crackles, wheeze, rhonchi Abdomen: soft/nontender/nondistended/normal bowel sounds. No rebound or guarding.  Ext: no edema Skin: warm, dry, scarring on right neck and upper back from prior shingles infection Neuro: grossly normal, moves all extremities, PERRLA   Assessment and Plan   88 y.o. female presenting for annual physical.  Health Maintenance counseling: 1. Anticipatory guidance: Patient counseled regarding regular dental exams - advised q6 months, eye exams - needs to update this exam,  avoiding smoking and second hand smoke , limiting alcohol to 1 beverage per day- very rare social beer with pizza , no illicit drugs.   2. Risk factor reduction:  Advised patient of need for regular exercise and diet rich and fruits and vegetables to reduce risk of heart attack and stroke.  Exercise- tries to stay active with yard work and housework- no intentional regular exercise- encouraged walking.  Diet/weight management-weight down 5 lbs in last year but stable since September after choledocolithiasis issues-  encouraged mild weight gain- even 5 lbs would be helpful.  Wt Readings from Last 3 Encounters:  03/05/23 111 lb 12.8 oz (50.7 kg)  07/20/22 110 lb 6.4 oz (50.1 kg)   05/16/22 111 lb (50.3 kg)  3. Immunizations/screenings/ancillary studies- shingrix at pharmacy  declines Tetanus, Diphtheria, and Pertussis (Tdap) , consider COVID shot in fall Immunization History  Administered Date(s) Administered   Influenza Whole 06/05/2010   Influenza, High Dose Seasonal PF 07/01/2017   Moderna Sars-Covid-2 Vaccination 01/02/2020, 01/30/2020, 08/02/2020   PNEUMOCOCCAL CONJUGATE-20 02/27/2022  4. Cervical cancer screening- past age based screening recommendations 5. Breast cancer screening-  breast exam-prefers occasional self exams-and mammogram-past age based screening recommendations   6. Colon cancer screening - March 2015 with 5-year repeat recommended due to family history and right colectomy 2007 for large polyp-she has declined colonoscopy in the past and declines again today-aware of potential cancer/mortality risk- plus now over age 59 not recommended- unless she has blood in stool- she hasn't noted  7. Skin cancer screening- no dermatologist.  advised regular sunscreen use. Denies worrisome, changing, or new skin lesions.  8. Birth control/STD  check- widowed.  Active with a friend but monogamous last year- they are no longer dating though. 9. Osteoporosis screening at 89- known osteoporosis from [place] reports.  Did not tolerate calcium and vitamin D.  She has never been on Fosamax or similar per her recollection.  Previously NOT interested in repeat bone density-but last year agreed- osteoporosis noted but she declined fosamax- did agree to restart calcium 1200 mg /vitamin D at least 800 units.  and try weight bearing exercise - encouraged again today 10. Smoking associated screening - never smoker  Status of chronic or acute concerns   #Postherpetic neuralgia noted at visit in 2021 after shingles-unfortunately continues to have pain.  She has tried gabapentin, Cymbalta, Lyrica, nortriptyline, capsaicin, Tylenol arthritis in the past.  We did prescribe tramadol in  2021 visit  -[facility] to [facility] in [facility] but she was never seen-will refer back this year  #hyperlipidemia S: Medication:none  - Past age based recommendations for initiation of statin for primary prevention Lab Results  Component Value Date   CHOL 220 (H) 02/27/2022   HDL 80.60 02/27/2022   LDLCALC 112 (H) 02/27/2022   TRIG 136.0 02/27/2022   CHOLHDL 3 02/27/2022  A/P: update lipids- focus on healthy diet and exercise- hold off on medications at her age with no history of cardiovascular disease   #Vitamin D deficiency-in the teens before S: Medication: 1000 units- took prior high dos Last vitamin D Lab Results  Component Value Date   VD25OH 20.11 (L) 06/07/2022  A/P: hopefully improved- update vitamin D today. Continue current meds for now    #Glaucoma-follows up with Dr. Randon Goldsmith office typically for this- she needs to schedule follow up    #prior elevated LFTs- improved September 2023/normalized thankfully- after treatemet for choledocholithiasis/treatment with ERCP with Dr. Meridee Score  Lab Results  Component Value Date   ALT 11 07/19/2022   AST 15 07/19/2022   ALKPHOS 104 07/19/2022   BILITOT 0.5 07/19/2022   From Prior notes from  07/2022 dr. Leone Payor- "I discussed that technically she has a precancerous condition with the gastric intestinal metaplasia.  I do not recommend a routine gastric mapping at her age and with these findings.  She is in agreement with this.  She is not inclined to have any more procedures unless absolutely necessary."  Recommended follow up: Return in about 1 year (around 03/04/2024) for physical or sooner if needed.Schedule b4 you leave.  Lab/Order associations: fasting   ICD-10-CM   1. Routine general medical examination at a health care facility  Z00.00     2. Hyperlipidemia, unspecified hyperlipidemia type  E78.5 Comprehensive metabolic panel    CBC with Differential/Platelet    Lipid panel    3. Age-related osteoporosis without current  pathological fracture  M81.0     4. Vitamin D deficiency  E55.9 VITAMIN D 25 Hydroxy (Vit-D Deficiency, Fractures)     No orders of the defined types were placed in this encounter.  Return precautions advised.  Tana Conch, MD

## 2023-03-05 NOTE — Patient Instructions (Addendum)
Let us know if you get any COVID vaccines this fall or your TDap at the pharmacy.  Advise dentist follow up as well as eye doctor  We have placed a referral for you today to physical medicine and rehabilitation to see if they have any ideas on the nerve pain. In some cases you will see # listed below- you can call this if you have not heard within a week. If you do not see # listed- you should receive a mychart message or phone call within a week with the # to call. Reach out to [place] if you ar enot scheduled within 2 weeks  Please stop by lab before you go If you have mychart- we will send your results within 3 business days of [place] receiving them.  If you do not have mychart- we will call you about results within 5 business days of [place] receiving them.  *please also note that you will see labs on mychart as soon as they post. I will later go in and write notes on them- will say "notes from Dr. Durene Cal"   Health Maintenance Due  Topic Date Due   Medicare Annual Wellness (AWV)  10/27/2021  You are eligible to schedule your annual wellness visit with our nurse specialist Inetta Fermo.  Please consider scheduling this before you leave today   Recommended follow up: Return in about 1 year (around 03/04/2024) for physical or sooner if needed.Schedule b4 you leave.

## 2023-03-07 ENCOUNTER — Encounter: Payer: Self-pay | Admitting: Physical Medicine and Rehabilitation

## 2023-03-11 ENCOUNTER — Telehealth: Payer: Self-pay | Admitting: Family Medicine

## 2023-03-11 NOTE — Telephone Encounter (Signed)
Pt would like a call back with lab results 

## 2023-03-11 NOTE — Telephone Encounter (Signed)
Called and spoke with pt and labs reviewed. 

## 2023-04-09 ENCOUNTER — Encounter: Payer: Medicare Other | Admitting: Physical Medicine and Rehabilitation

## 2023-05-09 ENCOUNTER — Encounter: Payer: Medicare Other | Admitting: Physical Medicine and Rehabilitation

## 2024-05-28 ENCOUNTER — Encounter: Payer: Self-pay | Admitting: Family Medicine

## 2024-05-28 ENCOUNTER — Ambulatory Visit: Payer: Self-pay | Admitting: Family Medicine

## 2024-05-28 ENCOUNTER — Ambulatory Visit (INDEPENDENT_AMBULATORY_CARE_PROVIDER_SITE_OTHER): Admitting: Family Medicine

## 2024-05-28 VITALS — BP 118/78 | HR 76 | Temp 97.6°F | Ht 65.0 in | Wt 108.8 lb

## 2024-05-28 DIAGNOSIS — Z0000 Encounter for general adult medical examination without abnormal findings: Secondary | ICD-10-CM | POA: Diagnosis not present

## 2024-05-28 DIAGNOSIS — M810 Age-related osteoporosis without current pathological fracture: Secondary | ICD-10-CM | POA: Diagnosis not present

## 2024-05-28 DIAGNOSIS — E559 Vitamin D deficiency, unspecified: Secondary | ICD-10-CM

## 2024-05-28 DIAGNOSIS — E785 Hyperlipidemia, unspecified: Secondary | ICD-10-CM

## 2024-05-28 LAB — CBC WITH DIFFERENTIAL/PLATELET
Basophils Absolute: 0 K/uL (ref 0.0–0.1)
Basophils Relative: 0.4 % (ref 0.0–3.0)
Eosinophils Absolute: 0 K/uL (ref 0.0–0.7)
Eosinophils Relative: 0.7 % (ref 0.0–5.0)
HCT: 40.2 % (ref 36.0–46.0)
Hemoglobin: 13 g/dL (ref 12.0–15.0)
Lymphocytes Relative: 33 % (ref 12.0–46.0)
Lymphs Abs: 1.4 K/uL (ref 0.7–4.0)
MCHC: 32.3 g/dL (ref 30.0–36.0)
MCV: 94.1 fl (ref 78.0–100.0)
Monocytes Absolute: 0.4 K/uL (ref 0.1–1.0)
Monocytes Relative: 10.5 % (ref 3.0–12.0)
Neutro Abs: 2.3 K/uL (ref 1.4–7.7)
Neutrophils Relative %: 55.4 % (ref 43.0–77.0)
Platelets: 199 K/uL (ref 150.0–400.0)
RBC: 4.27 Mil/uL (ref 3.87–5.11)
RDW: 13.9 % (ref 11.5–15.5)
WBC: 4.2 K/uL (ref 4.0–10.5)

## 2024-05-28 LAB — LIPID PANEL
Cholesterol: 205 mg/dL — ABNORMAL HIGH (ref 0–200)
HDL: 83 mg/dL (ref >39.00)
LDL Cholesterol: 101 mg/dL — ABNORMAL HIGH (ref 0–99)
NonHDL: 121.65
Total CHOL/HDL Ratio: 2
Triglycerides: 105 mg/dL (ref 0.0–149.0)
VLDL: 21 mg/dL (ref 0.0–40.0)

## 2024-05-28 LAB — COMPREHENSIVE METABOLIC PANEL WITH GFR
ALT: 9 U/L (ref 0–35)
AST: 14 U/L (ref 0–37)
Albumin: 4.4 g/dL (ref 3.5–5.2)
Alkaline Phosphatase: 110 U/L (ref 39–117)
BUN: 17 mg/dL (ref 6–23)
CO2: 31 meq/L (ref 19–32)
Calcium: 9.4 mg/dL (ref 8.4–10.5)
Chloride: 102 meq/L (ref 96–112)
Creatinine, Ser: 0.67 mg/dL (ref 0.40–1.20)
GFR: 78.4 mL/min (ref >60.00)
Glucose, Bld: 89 mg/dL (ref 70–99)
Potassium: 4.3 meq/L (ref 3.5–5.1)
Sodium: 140 meq/L (ref 135–145)
Total Bilirubin: 0.6 mg/dL (ref 0.2–1.2)
Total Protein: 6.8 g/dL (ref 6.0–8.3)

## 2024-05-28 LAB — VITAMIN D 25 HYDROXY (VIT D DEFICIENCY, FRACTURES): VITD: 10.87 ng/mL — ABNORMAL LOW (ref 30.00–100.00)

## 2024-05-28 MED ORDER — VITAMIN D (ERGOCALCIFEROL) 1.25 MG (50000 UNIT) PO CAPS: 50000.0000 [IU] | ORAL_CAPSULE | ORAL | 1 refills | Status: AC

## 2024-05-28 NOTE — Patient Instructions (Addendum)
 Schedule follow up with eye doctor as soon as you are able with son's situation  Schedule with dentist  Would love to see you add even 5-10 lbs- perhaps boost or ensure might help one a day  Also need vitamin D  1000 units MINIMUM but we may need high dose boost- I will reach out after labs  Please stop by lab before you go If you have mychart- we will send your results within 3 business days of us  receiving them.  If you do not have mychart- we will call you about results within 5 business days of us  receiving them.  *please also note that you will see labs on mychart as soon as they post. I will later go in and write notes on them- will say notes from Dr. Katrinka   Recommended follow up: Return in about 1 year (around 05/28/2025) for physical or sooner if needed.Schedule b4 you leave.

## 2024-05-28 NOTE — Progress Notes (Signed)
 Phone [phone]   Subjective:  Patient presents today for their annual physical. Chief complaint-noted.   See problem oriented charting- ROS- full  review of systems was completed and negative except for topics noted under acute/chronic concerns   The following were reviewed and entered/updated in epic: Past Medical History:  Diagnosis Date   Gallstones 05/05/2022   History of colonic polyps    Hyperlipidemia    Internal hemorrhoid    Osteoporosis    Shingles    Patient Active Problem List   Diagnosis Date Noted   Vitamin D  deficiency 05/16/2022    Priority: Medium    Erosive gastropathy 05/08/2022    Priority: Medium    Abnormal LFTs     Priority: Medium    History of ERCP     Priority: Medium    Post herpetic neuralgia 12/24/2019    Priority: Medium    Hyperlipidemia 05/05/2007    Priority: Medium    Osteoporosis 05/05/2007    Priority: Medium    Glaucoma 03/12/2016    Priority: Low   History of colonic polyps 05/05/2007    Priority: Low   Dilated bile duct    Choledocholithiasis with obstruction 05/04/2022   Sciatica of right side 06/29/2021   Past Surgical History:  Procedure Laterality Date   BIOPSY  05/07/2022   Procedure: BIOPSY;  Surgeon: Wilhelmenia Aloha Raddle., MD;  Location: [facility];  Service: Gastroenterology;;   CHOLECYSTECTOMY     at time of colectomy   COLONOSCOPY     ERCP N/A 05/06/2022   Procedure: ENDOSCOPIC RETROGRADE CHOLANGIOPANCREATOGRAPHY (ERCP);  Surgeon: Abran Norleen SAILOR, MD;  Location: [facility];  Service: Gastroenterology;  Laterality: N/A;   ERCP N/A 05/07/2022   Procedure: ENDOSCOPIC RETROGRADE CHOLANGIOPANCREATOGRAPHY (ERCP);  Surgeon: Wilhelmenia Aloha Raddle., MD;  Location: [facility];  Service: Gastroenterology;  Laterality: N/A;   ESOPHAGOGASTRODUODENOSCOPY     ESOPHAGOGASTRODUODENOSCOPY N/A 05/07/2022   Procedure: ESOPHAGOGASTRODUODENOSCOPY (EGD);  Surgeon: Wilhelmenia Aloha Raddle., MD;  Location: [facility];   Service: Gastroenterology;  Laterality: N/A;   EUS N/A 05/07/2022   Procedure: UPPER ENDOSCOPIC ULTRASOUND (EUS) LINEAR;  Surgeon: Wilhelmenia Aloha Raddle., MD;  Location: [facility];  Service: Gastroenterology;  Laterality: N/A;   REMOVAL OF STONES  05/07/2022   Procedure: REMOVAL OF STONES;  Surgeon: Mansouraty, Aloha Raddle., MD;  Location: [facility];  Service: Gastroenterology;;   RIGHT COLECTOMY     adenomatous polyp   SPHINCTEROTOMY  05/07/2022   Procedure: ANNETT;  Surgeon: Mansouraty, Aloha Raddle., MD;  Location: [facility];  Service: Gastroenterology;;    Family History  Problem Relation Age of Onset   Colon cancer Brother    Heart disease Mother        unclear history   Diabetes Sister    Lung cancer Father    Kidney disease Son    Prostate cancer Brother     Medications- reviewed and updated Current Outpatient Medications  Medication Sig Dispense Refill   cholecalciferol (VITAMIN D3) 25 MCG (1000 UNIT) tablet Take 1,000 Units by mouth daily. (Patient not taking: Reported on 05/28/2024)     pantoprazole  (PROTONIX ) 40 MG tablet Take 1 tablet (40 mg total) by mouth daily. (Patient not taking: Reported on 05/28/2024) 30 tablet 3   Vitamin D , Ergocalciferol , (DRISDOL ) 1.25 MG (50000 UNIT) CAPS capsule Take 1 capsule (50,000 Units total) by mouth every 7 (seven) days. (Patient not taking: Reported on 05/28/2024) 13 capsule 1   No current facility-administered medications for this visit.    Allergies-reviewed and updated Allergies  Allergen Reactions   Cannabinoids Hives   Penicillins Rash    Did it involve swelling of the face/tongue/throat, SOB, or low BP? No Did it involve sudden or severe rash/hives, skin peeling, or any reaction on the inside of your mouth or nose? Yes Did you need to seek medical attention at a hospital or doctor's office? No When did it last happen?  Several Years Ago     If all above answers are NO, may proceed with cephalosporin use.      Aspirin     Social History   Social History Narrative   Widowed in Oct 2014. 1 Son lives with her whom is on dialysis; 3 children (daughter, 2 sons) all live close-sees regularly. 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 Oropharynx normal. Normal tympanic membrane after removal of significant cerumen impaction on left with curette Neck: no thyromegaly CV: RRR no murmurs rubs or gallops Lungs: CTAB no crackles, wheeze, rhonchi Abdomen: soft/nontender/nondistended/normal bowel sounds. No rebound or guarding.  Ext: no edema Skin: warm, dry Neuro: grossly normal, moves all extremities, PERRLA   Assessment and Plan   89 y.o. female presenting for annual physical.  Health Maintenance counseling: 1. Anticipatory guidance: Patient counseled regarding regular dental exams - advised q6 months, eye exams - needs to schedule,  avoiding smoking and second hand smoke , limiting alcohol to 1 beverage per day- rare alcohol with pizza- a beer , no illicit drugs .   2. Risk factor reduction:  Advised patient of need for regular exercise and diet rich and fruits and vegetables to reduce risk of heart attack and stroke.  Exercise- tries to get steps in but no intentional regular exercise.  Diet/weight management-weight down another 3 pounds from last year-most of this has been recent with son being in the hospital-encouraged mild weight gain even 5-10  pounds once again from last year.  Wt Readings from Last 3 Encounters:  05/28/24 108 lb 12.8 oz (49.4 kg)  03/05/23 111 lb 12.8 oz (50.7 kg)  07/20/22 110 lb 6.4 oz (50.1 kg)  3. Immunizations/screenings/ancillary studies- opts out  flu shot today , discussed Shingrix at pharmacy as well as COVID-vaccine.  Also due for Tdap at pharmacy Immunization History  Administered Date(s) Administered   INFLUENZA, HIGH DOSE SEASONAL PF 07/01/2017   Influenza Whole 06/05/2010   Moderna Sars-Covid-2 Vaccination 01/02/2020, 01/30/2020, 08/02/2020   PNEUMOCOCCAL CONJUGATE-20 02/27/2022   4. Cervical cancer screening-  past age based screening recommendations  5. Breast cancer screening-  breast exam-prefers self exam-and mammogram - past age based screening recommendations   6. Colon cancer screening -March 2015 with 5-year repeat recommended due to family history and right colectomy 2007 for large polyp-she has declined colonoscopy in the past and declines again today-aware of potential cancer/mortality risk.  No blood in the stool thankfully  7. Skin cancer screening-no dermatologist.. advised regular sunscreen use. Denies worrisome, changing, or new skin lesions.  8. Birth control/STD check-widowed.  Not sexually active 9. Osteoporosis screening at 63- known osteoporosis from [place].  Has never been on Fosamax or similar medication.  We did note osteoporosis on her imaging 2023 and she was agreeable to calcium and vitamin D  but declined Fosamax or similar-repeat bone density due- offered today and declines. Discussed importance of avoiding falls in particular-   10. Smoking associated screening -never smoker  Status of chronic or acute concerns   # Social update-son in the hospital and has had multiple recurrent visits-has been a challenging time for her  #Postherpetic neuralgia noted at visit in 2021 after shingles-unfortunately continues to have pain.  She has tried gabapentin , Cymbalta , Lyrica ,  nortriptyline , capsaicin, Tylenol  arthritis in the past.  We did prescribe tramadol  in 2021 visit- not taking  -referred to [facility] in 2023 but she was never seen and had to cancel in 2024 as ewll- ongoing significant pain  #hyperlipidemia S: Medication:none - Past age based recommendations for initiation of statin for primary prevention Lab Results  Component Value Date   CHOL 225 (H) 03/05/2023   HDL 78.30 03/05/2023   LDLCALC 123 (H) 03/05/2023   TRIG 119.0 03/05/2023   CHOLHDL 3 03/05/2023  A/P: monitor with labs - unlikely to change plan   #Vitamin D  deficiency-in the teens before S: Medication: stopped her vitamin D  Last vitamin D  Lab Results  Component Value Date   VD25OH 21.33 (L) 03/05/2023  A/P: update vitamin D - has been off- may need high dose   #Glaucoma-follows up with Dr. Crisoforo office in the past- needs to reschedule    #prior elevated LFTs- improved September 2023/normalized thankfully- after treatemet for choledocholithiasis/treatment with ERCP with Dr. Wilhelmenia  Lab Results  Component Value Date   ALT 14 03/05/2023   AST 20 03/05/2023   ALKPHOS 114 03/05/2023   BILITOT 0.5 03/05/2023   Recommended follow up: Return in about 1 year (around 05/28/2025) for physical or sooner if needed.Schedule b4 you leave.  Lab/Order associations: fasting   ICD-10-CM   1. Preventative health care  Z00.00     2. Hyperlipidemia, unspecified hyperlipidemia type  E78.5     3. Vitamin D  deficiency  E55.9     4. Age-related osteoporosis without current pathological fracture  M81.0       No orders of the defined types were placed in this encounter.   Return precautions advised.  Garnette Lukes, MD

## 2024-06-05 ENCOUNTER — Telehealth: Payer: Self-pay | Admitting: Family Medicine

## 2024-06-05 NOTE — Telephone Encounter (Signed)
 Noted thank you.   Copied from CRM [id number]. Topic: Clinical - Lab/Test Results >> Jun 05, 2024  9:15 AM Revonda D wrote: Reason for CRM: Pt has been made aware of recent lab results.

## 2024-06-29 ENCOUNTER — Ambulatory Visit

## 2024-06-29 ENCOUNTER — Ambulatory Visit: Admitting: Family Medicine

## 2024-06-29 ENCOUNTER — Encounter: Payer: Self-pay | Admitting: Family Medicine

## 2024-06-29 VITALS — BP 122/74 | HR 63 | Temp 97.9°F | Ht 65.0 in | Wt 110.6 lb

## 2024-06-29 DIAGNOSIS — M85872 Other specified disorders of bone density and structure, left ankle and foot: Secondary | ICD-10-CM | POA: Diagnosis not present

## 2024-06-29 DIAGNOSIS — M25572 Pain in left ankle and joints of left foot: Secondary | ICD-10-CM

## 2024-06-29 NOTE — Patient Instructions (Addendum)
 You do have some tenderness along the ankle- we opted to get x-rays to rule out fracture of the ankle although you do not have an injury history. If you have new or worsening symptom(s) see us  back but suspect will improve within 2-3 weeks and gradually resolve after that  Recommended follow up: Return for next already scheduled visit or sooner if needed.

## 2024-06-29 NOTE — Progress Notes (Signed)
 Phone [phone] In person visit   Subjective:   Sheila Wood is a 89 y.o. year old very pleasant female patient who presents for/with See problem oriented charting Chief Complaint  Patient presents with   Bleeding/Bruising    Patient has a bruise on the top of her left foot; not painful; no injury; started x5 days ago; not warm to the touch; slight swelling; slight pain in calf area;     Past Medical History-  Patient Active Problem List   Diagnosis Date Noted   Vitamin D  deficiency 05/16/2022    Priority: Medium    Erosive gastropathy 05/08/2022    Priority: Medium    Abnormal LFTs     Priority: Medium    History of ERCP     Priority: Medium    Post herpetic neuralgia 12/24/2019    Priority: Medium    Hyperlipidemia 05/05/2007    Priority: Medium    Osteoporosis 05/05/2007    Priority: Medium    Glaucoma 03/12/2016    Priority: Low   History of colonic polyps 05/05/2007    Priority: Low   Dilated bile duct    Choledocholithiasis with obstruction 05/04/2022   Sciatica of right side 06/29/2021    Medications- reviewed and updated Current Outpatient Medications  Medication Sig Dispense Refill   Vitamin D , Ergocalciferol , (DRISDOL ) 1.25 MG (50000 UNIT) CAPS capsule Take 1 capsule (50,000 Units total) by mouth every 7 (seven) days. 13 capsule 1   cholecalciferol (VITAMIN D3) 25 MCG (1000 UNIT) tablet Take 1,000 Units by mouth daily. (Patient not taking: Reported on 06/29/2024)     pantoprazole  (PROTONIX ) 40 MG tablet Take 1 tablet (40 mg total) by mouth daily. (Patient not taking: Reported on 06/29/2024) 30 tablet 3   No current facility-administered medications for this visit.     Objective:  BP 122/74 (BP Location: Left Arm, Patient Position: Sitting, Cuff Size: Normal)   Pulse 63   Temp 97.9 F (36.6 C) (Temporal)   Ht 5' 5 (1.651 m)   Wt 110 lb 9.6 oz (50.2 kg)   LMP  (LMP Unknown)   SpO2 97%   BMI 18.40 kg/m   CV: RRR  Lungs: nonlabored, normal  respiratory rate Abdomen: soft/nondistended  Ext: no edema at the ankle and calves are equal 10 cm below tibial plateau.  No calf pain She has some significant tenderness along anterior portion of lateral malleolus on the left leg-she has significant swelling around this area-area not red or warm-below this and into the mid and forefoot significant bruising noted with mild swelling but no tenderness     Assessment and Plan   # Foot bruise S: Patient with a bruise on top of her left foot.  Not painful.  Denies injury.  Started about 5 days ago.  Not warm to the touch.  Slight swelling.  Notes slight pain in the Area on the left side  A/P: Significant foot bruise along with left ankle pain-denies injury-wonder if she could have tweaked the ankle-offered x-ray to evaluate for fracture or other bony abnormality-she opts in.  Otherwise we discussed if this is normal monitoring for other symptom such as worsening swelling or redness or if she were to develop) she denies calf pain but states she has an odd sensation over her shin of fullness).  Discussed bruising can take several weeks to heal    Recommended follow up: Return for next already scheduled visit or sooner if needed. Future Appointments  Date Time Provider Department Center  05/31/2025  8:20 AM Katrinka, Garnette KIDD, MD [facility]    Lab/Order associations:   ICD-10-CM   1. Acute left ankle pain  M25.572 DG Ankle Complete Left     No orders of the defined types were placed in this encounter.  Return precautions advised.  Garnette Katrinka, MD

## 2024-07-02 ENCOUNTER — Telehealth: Payer: Self-pay

## 2024-07-02 ENCOUNTER — Ambulatory Visit: Payer: Self-pay | Admitting: Family Medicine

## 2024-07-02 NOTE — Telephone Encounter (Signed)
 Copied from CRM [id number]. Topic: Clinical - Lab/Test Results >> Jul 02, 2024  3:45 PM Drema MATSU wrote: Reason for CRM: Patient is calling to get her xray results.

## 2024-07-02 NOTE — Telephone Encounter (Signed)
 Please reference result notes and please always let patient know that I try to always go over results before the end of the day-often I have to look at these in the evening as I am actively seeing patients in the daytime

## 2024-07-03 NOTE — Progress Notes (Signed)
 Patient already aware of results

## 2024-07-03 NOTE — Telephone Encounter (Signed)
 Returned pt call and advised xray results and msg from Dr Katrinka; Pt verbalized understanding

## 2024-07-06 ENCOUNTER — Encounter: Payer: Self-pay | Admitting: Internal Medicine

## 2024-07-06 ENCOUNTER — Ambulatory Visit (INDEPENDENT_AMBULATORY_CARE_PROVIDER_SITE_OTHER): Admitting: Internal Medicine

## 2024-07-06 VITALS — BP 124/82 | HR 57 | Temp 97.9°F | Ht 65.0 in | Wt 110.4 lb

## 2024-07-06 DIAGNOSIS — T783XXA Angioneurotic edema, initial encounter: Secondary | ICD-10-CM | POA: Diagnosis not present

## 2024-07-06 MED ORDER — PREDNISONE 10 MG PO TABS: ORAL_TABLET | ORAL | 0 refills | Status: AC

## 2024-07-06 MED ORDER — TRIAMCINOLONE ACETONIDE 0.5 % EX OINT: 1.0000 | TOPICAL_OINTMENT | Freq: Two times a day (BID) | CUTANEOUS | 0 refills | Status: AC

## 2024-07-06 NOTE — Patient Instructions (Signed)
 You appear to have an area of Angioedema (a type of allergy reaction) most likely due to an insect  Ok to use the OTC Benadryl cream for itching  Please take all new medication as prescribed - the steroid cream, as well as a short course of low dose prednisone   Please continue all other medications as before, and refills have been done if requested.  Please have the pharmacy call with any other refills you may need.  Please keep your appointments with your specialists as you may have planned

## 2024-07-06 NOTE — Assessment & Plan Note (Signed)
 New onset likely insect related; ok for triam cr prn asd, and short course low dose prednisone  10 mg x 5 days; pt decline epipen rx or allergy referral,  to f/u any worsening symptoms or concerns

## 2024-07-06 NOTE — Progress Notes (Signed)
 Patient ID: Sheila Wood, female   DOB: 1935-01-23, 89 y.o.   MRN: [id number]        Chief Complaint: follow up insect sting and redness swelling to post knee and adjacent post upper leg - angioedema       HPI:  Sheila Wood is a 89 y.o. female here with c/o likely insect sting and redness swelling to post knee and adjacent post upper leg for 3 days somewhat soothed by otc cortisone cream, but last evening with intense itchy and firmness to the area.  Pt was working in the yard, felt a sting but did not see the type of insect.  No prior hx of angioedema or anaphylaxis.  Pt denies chest pain, increased sob or doe, wheezing, orthopnea, PND, increased LE swelling, palpitations, dizziness or syncope.        Wt Readings from Last 3 Encounters:  07/06/24 110 lb 6 oz (50.1 kg)  06/29/24 110 lb 9.6 oz (50.2 kg)  05/28/24 108 lb 12.8 oz (49.4 kg)   BP Readings from Last 3 Encounters:  07/06/24 124/82  06/29/24 122/74  05/28/24 118/78         Past Medical History:  Diagnosis Date   Gallstones 05/05/2022   History of colonic polyps    Hyperlipidemia    Internal hemorrhoid    Osteoporosis    Shingles    Past Surgical History:  Procedure Laterality Date   BIOPSY  05/07/2022   Procedure: BIOPSY;  Surgeon: Wilhelmenia Aloha Raddle., MD;  Location: [facility];  Service: Gastroenterology;;   CHOLECYSTECTOMY     at time of colectomy   COLONOSCOPY     ERCP N/A 05/06/2022   Procedure: ENDOSCOPIC RETROGRADE CHOLANGIOPANCREATOGRAPHY (ERCP);  Surgeon: Abran Norleen SAILOR, MD;  Location: [facility];  Service: Gastroenterology;  Laterality: N/A;   ERCP N/A 05/07/2022   Procedure: ENDOSCOPIC RETROGRADE CHOLANGIOPANCREATOGRAPHY (ERCP);  Surgeon: Wilhelmenia Aloha Raddle., MD;  Location: [facility];  Service: Gastroenterology;  Laterality: N/A;   ESOPHAGOGASTRODUODENOSCOPY     ESOPHAGOGASTRODUODENOSCOPY N/A 05/07/2022   Procedure: ESOPHAGOGASTRODUODENOSCOPY (EGD);  Surgeon: Wilhelmenia Aloha Raddle., MD;  Location: [facility];  Service: Gastroenterology;  Laterality: N/A;   EUS N/A 05/07/2022   Procedure: UPPER ENDOSCOPIC ULTRASOUND (EUS) LINEAR;  Surgeon: Wilhelmenia Aloha Raddle., MD;  Location: [facility];  Service: Gastroenterology;  Laterality: N/A;   REMOVAL OF STONES  05/07/2022   Procedure: REMOVAL OF STONES;  Surgeon: Wilhelmenia Aloha Raddle., MD;  Location: [facility];  Service: Gastroenterology;;   RIGHT COLECTOMY     adenomatous polyp   SPHINCTEROTOMY  05/07/2022   Procedure: ANNETT;  Surgeon: Mansouraty, Aloha Raddle., MD;  Location: [facility];  Service: Gastroenterology;;    reports that she has never smoked. She has never used smokeless tobacco. She reports that she does not currently use alcohol after a past usage of about 2.0 standard drinks of alcohol per week. She reports that she does not use drugs. family history includes Colon cancer in her brother; Diabetes in her sister; Heart disease in her mother; Kidney disease in her son; Lung cancer in her father; Prostate cancer in her brother. Allergies  Allergen Reactions   Cannabinoids Hives   Penicillins Rash    Did it involve swelling of the face/tongue/throat, SOB, or low BP? No Did it involve sudden or severe rash/hives, skin peeling, or any reaction on the inside of your mouth or nose? Yes Did you need to seek medical attention at a hospital or doctor's office? No When did it last happen?  Several Years Ago     If all above answers are NO, may proceed with cephalosporin use.     Aspirin    Current Outpatient Medications on File Prior to Visit  Medication Sig Dispense Refill   Vitamin D , Ergocalciferol , (DRISDOL ) 1.25 MG (50000 UNIT) CAPS capsule Take 1 capsule (50,000 Units total) by mouth every 7 (seven) days. 13 capsule 1   cholecalciferol (VITAMIN D3) 25 MCG (1000 UNIT) tablet Take 1,000 Units by mouth daily. (Patient not taking: Reported on 07/06/2024)     pantoprazole  (PROTONIX ) 40 MG tablet Take 1 tablet (40 mg  total) by mouth daily. (Patient not taking: Reported on 07/06/2024) 30 tablet 3   No current facility-administered medications on file prior to visit.        ROS:  All others reviewed and negative.  Objective        PE:  BP 124/82   Pulse (!) 57   Temp 97.9 F (36.6 C) (Temporal)   Ht 5' 5 (1.651 m)   Wt 110 lb 6 oz (50.1 kg)   LMP  (LMP Unknown)   SpO2 97%   BMI 18.37 kg/m                 Constitutional: Pt appears in NAD               HENT: Head: NCAT.                Right Ear: External ear normal.                 Left Ear: External ear normal.                Eyes: . Pupils are equal, round, and reactive to light. Conjunctivae and EOM are normal               Nose: without d/c or deformity               Neck: Neck supple. Gross normal ROM               Cardiovascular: Normal rate and regular rhythm.                 Pulmonary/Chest: Effort normal and breath sounds without rales or wheezing.                Abd:  Soft, NT, ND, + BS, no organomegaly               Neurological: Pt is alert. At baseline orientation, motor grossly intact               Skin: Skin is warm. LE edema - none; has large 10 x 4 cm area to post knee and adjacent post upper leg redness firmness with itching, but minimally tender, no ulcer or red streaks               Psychiatric: Pt behavior is normal without agitation   Micro: none  Cardiac tracings I have personally interpreted today:  none  Pertinent Radiological findings (summarize): none   Lab Results  Component Value Date   WBC 4.2 05/28/2024   HGB 13.0 05/28/2024   HCT 40.2 05/28/2024   PLT 199.0 05/28/2024   GLUCOSE 89 05/28/2024   CHOL 205 (H) 05/28/2024   TRIG 105.0 05/28/2024   HDL 83.00 05/28/2024   LDLCALC 101 (H) 05/28/2024   ALT 9 05/28/2024   AST 14 05/28/2024   NA 140 05/28/2024  K 4.3 05/28/2024   CL 102 05/28/2024   CREATININE 0.67 05/28/2024   BUN 17 05/28/2024   CO2 31 05/28/2024   TSH 0.79 09/07/2014    Assessment/Plan:  Sheila Wood is a 89 y.o. White or Caucasian [1] female with  has a past medical history of Gallstones (05/05/2022), History of colonic polyps, Hyperlipidemia, Internal hemorrhoid, Osteoporosis, and Shingles.  Angioedema New onset likely insect related; ok for triam cr prn asd, and short course low dose prednisone  10 mg x 5 days; pt decline epipen rx or allergy referral,  to f/u any worsening symptoms or concerns  Followup: Return if symptoms worsen or fail to improve.  Lynwood Rush, MD 07/06/2024 4:56 PM [facility]

## 2024-08-10 ENCOUNTER — Telehealth: Payer: Self-pay | Admitting: Family Medicine

## 2024-08-10 MED ORDER — OSELTAMIVIR PHOSPHATE 75 MG PO CAPS: 75.0000 mg | ORAL_CAPSULE | Freq: Every day | ORAL | 0 refills | Status: AC

## 2024-08-10 NOTE — Telephone Encounter (Signed)
 Her son Garnette just tested positive for flu and would like prophylactic tamiflu .

## 2024-08-10 NOTE — Addendum Note (Signed)
 Addended by: JODIE GAMMONS on: 08/10/2024 11:01 AM   Modules accepted: Orders

## 2025-05-31 ENCOUNTER — Encounter: Admitting: Family Medicine
